# Patient Record
Sex: Female | Born: 1970 | Race: White | Hispanic: No | Marital: Married | Smoking: Former smoker
Health system: Southern US, Community
[De-identification: ages and names within clinical notes are randomized; demographics above are authoritative.]

## PROBLEM LIST (undated history)

## (undated) DIAGNOSIS — Z9221 Personal history of antineoplastic chemotherapy: Secondary | ICD-10-CM

## (undated) DIAGNOSIS — F1010 Alcohol abuse, uncomplicated: Secondary | ICD-10-CM

## (undated) DIAGNOSIS — G4700 Insomnia, unspecified: Secondary | ICD-10-CM

## (undated) DIAGNOSIS — Z923 Personal history of irradiation: Secondary | ICD-10-CM

## (undated) DIAGNOSIS — F1090 Alcohol use, unspecified, uncomplicated: Secondary | ICD-10-CM

## (undated) DIAGNOSIS — C50919 Malignant neoplasm of unspecified site of unspecified female breast: Secondary | ICD-10-CM

## (undated) HISTORY — DX: Alcohol use, unspecified, uncomplicated: F10.90

## (undated) HISTORY — PX: NASAL SEPTUM SURGERY: SHX37

## (undated) HISTORY — DX: Personal history of irradiation: Z92.3

## (undated) HISTORY — DX: Personal history of antineoplastic chemotherapy: Z92.21

## (undated) HISTORY — DX: Insomnia, unspecified: G47.00

---

## 1998-12-26 ENCOUNTER — Other Ambulatory Visit: Admission: RE | Admit: 1998-12-26 | Discharge: 1998-12-26 | Payer: Self-pay | Admitting: Obstetrics & Gynecology

## 1999-07-23 ENCOUNTER — Inpatient Hospital Stay (HOSPITAL_COMMUNITY): Admission: AD | Admit: 1999-07-23 | Discharge: 1999-07-26 | Payer: Self-pay | Admitting: Obstetrics & Gynecology

## 1999-07-28 ENCOUNTER — Encounter: Admission: RE | Admit: 1999-07-28 | Discharge: 1999-10-05 | Payer: Self-pay | Admitting: Obstetrics & Gynecology

## 2002-02-01 ENCOUNTER — Other Ambulatory Visit: Admission: RE | Admit: 2002-02-01 | Discharge: 2002-02-01 | Payer: Self-pay | Admitting: Obstetrics and Gynecology

## 2003-01-24 ENCOUNTER — Ambulatory Visit (HOSPITAL_COMMUNITY): Admission: RE | Admit: 2003-01-24 | Discharge: 2003-01-24 | Payer: Self-pay | Admitting: Obstetrics and Gynecology

## 2003-01-24 ENCOUNTER — Encounter: Payer: Self-pay | Admitting: Obstetrics and Gynecology

## 2003-02-09 ENCOUNTER — Other Ambulatory Visit: Admission: RE | Admit: 2003-02-09 | Discharge: 2003-02-09 | Payer: Self-pay | Admitting: Obstetrics and Gynecology

## 2003-12-02 ENCOUNTER — Inpatient Hospital Stay (HOSPITAL_COMMUNITY): Admission: AD | Admit: 2003-12-02 | Discharge: 2003-12-04 | Payer: Self-pay | Admitting: Obstetrics and Gynecology

## 2004-01-12 ENCOUNTER — Other Ambulatory Visit: Admission: RE | Admit: 2004-01-12 | Discharge: 2004-01-12 | Payer: Self-pay | Admitting: Obstetrics and Gynecology

## 2005-02-20 ENCOUNTER — Other Ambulatory Visit: Admission: RE | Admit: 2005-02-20 | Discharge: 2005-02-20 | Payer: Self-pay | Admitting: Obstetrics and Gynecology

## 2006-02-26 ENCOUNTER — Other Ambulatory Visit: Admission: RE | Admit: 2006-02-26 | Discharge: 2006-02-26 | Payer: Self-pay | Admitting: Obstetrics and Gynecology

## 2008-02-02 ENCOUNTER — Ambulatory Visit (HOSPITAL_COMMUNITY): Admission: RE | Admit: 2008-02-02 | Discharge: 2008-02-02 | Payer: Self-pay | Admitting: Obstetrics and Gynecology

## 2008-06-13 ENCOUNTER — Encounter: Admission: RE | Admit: 2008-06-13 | Discharge: 2008-06-13 | Payer: Self-pay | Admitting: Family Medicine

## 2010-07-31 ENCOUNTER — Other Ambulatory Visit: Payer: Self-pay | Admitting: Obstetrics and Gynecology

## 2010-07-31 DIAGNOSIS — Z1231 Encounter for screening mammogram for malignant neoplasm of breast: Secondary | ICD-10-CM

## 2010-09-28 NOTE — H&P (Signed)
[facility]  Patient:    Kelly Mills, Kelly Mills                      MRN: [id number] Adm. Date:  [id number] Attending:  Cleatrice Burke Dictator:   Wynelle Bourgeois, C.N.M.                         History and Physical  HISTORY OF PRESENT ILLNESS:   Ms. Boutwell is a 40 year old, G2, P0-0-1-0 at 42-1/7 weeks who presents today with spontaneous rupture of membranes at 0700 a.m. on July 22, 1999. Clear fluid noted and contractions now approximately every ten minutes.  The patient denies bleeding and reports positive fetal movement. Pregnancy has been remarkable for first trimester spotting and positive Group B  strep.  PRENATAL LABS:                 Hemoglobin 12.5, platelets 225. Blood type 0 positive. Rh antibody negative.  VDRL RPR nonreactive.  Rubella immune.  HBSAG negative.  HIV nonreactive.  Pap test within normal limits. Positive yeast. Gonorrhea negative.  Chlamydia negative.  MSAFP/free beta within normal limits.  Glucose challenge/Glucola 117.  HISTORY OF PRESENT PREGNANCY:                    Remarkable for first trimester spotting which resolved and positive Group B strep, otherwise unremarkable.  OB HISTORY:                   Remarkable for spontaneous abortion on February 1992 at six to eight weeks gestation with an dilatation and curettage.  The patient ot pregnant on birth control pills and took antibiotics early in that pregnancy.  MEDICAL HISTORY:              Remarkable for the usual childhood illnesses, otherwise unremarkable.  FAMILY HISTORY:               Remarkable for depression in the patients mother.  GENETIC HISTORY:              Remarkable for multiple sclerosis in the maternal  grandfather.  SOCIAL HISTORY:               The patient is married to [employer]. The patient works as Clinical research associate and denies alcohol, tobacco or other substance abuse.  PHYSICAL EXAMINATION:  VITAL SIGNS:                  Afebrile.  Vital  signs stable.  HEENT:                        Within normal limits.  LUNGS:                        Clear to auscultation bilaterally.  HEART:                        Regular rate and rhythm.  No murmurs.  BREASTS:                      Soft. No masses.  ABDOMEN:                      Soft and nontender.   Gravid at 41 cm.  Positive fetal movement.  Fetal heart tones 160s in the office.  EFM  not obtained in office.  PELVIC:                       Cervix 1 to 2 cm, thin, 0 to -1 per speculum exam. Speculum exam positive nitrates, positive ferning, positive pooling.  Clear fluid.  EXTREMITIES:                  Within normal limits.  Negative Homans sign.  ASSESSMENT:                   1. Uterine pregnancy at 42-1/7 weeks.                               2. Spontaneous rupture of membranes times 28 hours.                               3. Early labor.                               4. GBS positive.  PLAN:                         1. Admit to birthing suite per consult Dr. Synetta Fail                                  [place].                               2. Routine CNM orders.                               3. Penicillin G 5 million units IV piggyback                                  times one followed by 3.5 million units IV                                  piggyback q.4h. per GBS protocol. DD:  07/23/99 TD:  07/23/99 Job: [id number]

## 2010-09-28 NOTE — H&P (Signed)
NAME:  Kelly Mills                         ACCOUNT NO.:  [id number]   MEDICAL RECORD NO.:  [id number]                   PATIENT TYPE:  INP   LOCATION:  9167                                 FACILITY:  [facility]   PHYSICIAN:  Crist Fat. Rivard, M.D.              DATE OF BIRTH:  04/01/1971   DATE OF ADMISSION:  12/02/2003  DATE OF DISCHARGE:                                HISTORY & PHYSICAL   HISTORY OF PRESENT ILLNESS:  Kelly Mills is a 40 year old gravida 3 para 1-0-  1-1 at 77 and two-sevenths weeks who presented in active labor with onset of  uterine contractions at approximately 5 p.m.  On admission she was  completely dilated with spontaneous rupture of membranes occurring with  initial exam.  Light meconium-stained fluid was noted.  The patient then  progressed quickly to delivery over a first degree perineal laceration.  She  had a viable female infant by the name of Kelly Mills.  Weight is unknown at this  time.  Apgars were 9 and 9.  The infant was DeLee-suctioned on the perineum  for 3 mL of light meconium-stained fluid.  There was no time for IV access  initiation, beta strep prophylaxis, or admit labs.  The patient completed  her recovery and was taken to postpartum in good condition.  The infant was  taken to the full-term nursery.  Pregnancy was remarkable for:  1. Positive beta strep.  2. Conception on Clomid.  3. Mild anemia.   PRENATAL LABORATORY DATA:  Blood type is O positive, Rh antibody negative.  VDRL nonreactive.  Rubella titer immune.  Hepatitis B surface antigen  negative.  HIV was declined.  Cystic fibrosis testing was declined.  Pap was  normal in July 2004.  Hemoglobin upon entering the practice was 11.2; it was  9.3 at 27 weeks.  EDC of November 30, 2003 was established by last menstrual  period and was in agreement with ultrasound at approximately 9-10 weeks.  Positive beta strep was noted at 36 weeks with GC and chlamydia cultures  negative.  Quadruple screen was  within normal limits.   HISTORY OF PRESENT PREGNANCY:  The patient entered care at approximately 11  weeks.  She did have a little bit of early spotting secondary to cervical  friability.  She had another ultrasound at 19 weeks that showed normal  growth and development.  She had issues with insomnia during her pregnancy.  At 27 weeks she was diagnosed with anemia and was started on iron.  She had  an elevated 1-hour GTT and a normal 3-hour GTT.  The rest of her pregnancy  was essentially uncomplicated.  She had positive beta strep noted at 36  weeks.   OBSTETRICAL HISTORY:  The patient approximately 4 years ago had a vaginal  birth of a female infant without complications.  This pregnancy was achieved  with Clomid.  In 1990 the patient also had  a miscarriage which required a  D&C.   MEDICAL HISTORY:  She has no significant medical problems.  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   FAMILY HISTORY:  Maternal grandmother had an MI and angina.  Father and  mother have hypertension.  Maternal grandmother had varicose veins and a  blood clot.  Mother had a blood clot.  Mother had anemia.  Maternal  grandmother had asthma.  Brother had childhood asthma.  Paternal grandfather  had insulin-dependent diabetes.  Paternal grandmother and paternal uncle had  Alzheimer's.  Mother had a stroke that affected her eye.  Paternal  grandfather had multiple sclerosis.  Maternal grandfather had skin cancer.  Father had a benign brain tumor.  Mother does have a history of depression  and psychotic episodes.  Mother, father, and brother all have moderate use  of alcohol.   SURGICAL HISTORY:  Includes wisdom teeth removed in 1990 and a D&C in 1991.  Her only other hospitalization was for childbirth.   SOCIAL HISTORY:  The patient is married to the father of the baby.  He is  involved and supportive.  His name is [employer].  The patient is self-  employed.  She has been followed by  the certified nurse midwife service at  [facility].  She denies any alcohol, drug, or tobacco use during  this pregnancy.   PHYSICAL EXAMINATION:  VITAL SIGNS:  Stable; the patient afebrile.  HEENT:  Within normal limits.  LUNGS:  Bilateral breath sounds are clear.  HEART:  Regular rate and rhythm without murmur.  BREASTS:  Soft and nontender.  ABDOMEN:  Fundal height is approximately 39 cm.  Estimated fetal weight is 8  to 8-and-a-half pounds.  Uterine contractions are every 3 minutes, strong  quality.  PELVIC:  On cervical exam the patient is completely dilated with bulging bag  of water, fetus is at a 0 station on admission.  Ruptured membranes  spontaneously occurred with the exam with light meconium-stained fluid  noted.  EXTREMITIES:  Deep tendon reflexes are 2+ without clonus.  There is a trace  edema noted.   IMPRESSION:  1. Intrauterine pregnancy at 40 and two-sevenths weeks.  2. Second stage of labor.  3. Positive group B streptococcus with no opportunity anticipated for group     B streptococcus prophylaxis.   PLAN:  1. Admit to birthing suite per consult with Dr. Silverio Lay as attending     physician.  2. Routine certified nurse midwife orders.  3. Will defer IV, antibiotic, and admit labs at present.     Renaldo Reel Emilee Hero, C.N.M.                   Crist Fat Rivard, M.D.    Leeanne Mannan  D:  12/02/2003  T:  12/03/2003  Job:  [id number]

## 2010-10-04 ENCOUNTER — Ambulatory Visit: Payer: Self-pay

## 2010-10-09 ENCOUNTER — Ambulatory Visit
Admission: RE | Admit: 2010-10-09 | Discharge: 2010-10-09 | Disposition: A | Discharge status: Home / Self Care | Payer: BC Managed Care – PPO | Source: Ambulatory Visit | Attending: Obstetrics and Gynecology | Admitting: Obstetrics and Gynecology

## 2010-10-09 DIAGNOSIS — Z1231 Encounter for screening mammogram for malignant neoplasm of breast: Secondary | ICD-10-CM

## 2011-06-26 ENCOUNTER — Other Ambulatory Visit: Payer: Self-pay | Admitting: Obstetrics and Gynecology

## 2011-06-26 DIAGNOSIS — N6310 Unspecified lump in the right breast, unspecified quadrant: Secondary | ICD-10-CM

## 2011-07-03 ENCOUNTER — Ambulatory Visit
Admission: RE | Admit: 2011-07-03 | Discharge: 2011-07-03 | Disposition: A | Discharge status: Home / Self Care | Payer: BC Managed Care – PPO | Source: Ambulatory Visit | Attending: Obstetrics and Gynecology | Admitting: Obstetrics and Gynecology

## 2011-07-03 ENCOUNTER — Other Ambulatory Visit: Payer: Self-pay | Admitting: Obstetrics and Gynecology

## 2011-07-03 DIAGNOSIS — N6310 Unspecified lump in the right breast, unspecified quadrant: Secondary | ICD-10-CM

## 2011-07-08 ENCOUNTER — Ambulatory Visit
Admission: RE | Admit: 2011-07-08 | Discharge: 2011-07-08 | Disposition: A | Discharge status: Home / Self Care | Payer: BC Managed Care – PPO | Source: Ambulatory Visit | Attending: Obstetrics and Gynecology | Admitting: Obstetrics and Gynecology

## 2011-07-08 ENCOUNTER — Other Ambulatory Visit: Payer: Self-pay | Admitting: Obstetrics and Gynecology

## 2011-07-08 ENCOUNTER — Other Ambulatory Visit: Payer: BC Managed Care – PPO

## 2011-07-08 DIAGNOSIS — N6310 Unspecified lump in the right breast, unspecified quadrant: Secondary | ICD-10-CM

## 2011-07-08 DIAGNOSIS — C801 Malignant (primary) neoplasm, unspecified: Secondary | ICD-10-CM | POA: Insufficient documentation

## 2011-07-09 ENCOUNTER — Other Ambulatory Visit: Payer: Self-pay | Admitting: Obstetrics and Gynecology

## 2011-07-09 ENCOUNTER — Ambulatory Visit
Admission: RE | Admit: 2011-07-09 | Discharge: 2011-07-09 | Disposition: A | Discharge status: Home / Self Care | Payer: BC Managed Care – PPO | Source: Ambulatory Visit | Attending: Obstetrics and Gynecology | Admitting: Obstetrics and Gynecology

## 2011-07-09 DIAGNOSIS — N6489 Other specified disorders of breast: Secondary | ICD-10-CM

## 2011-07-09 DIAGNOSIS — C50911 Malignant neoplasm of unspecified site of right female breast: Secondary | ICD-10-CM

## 2011-07-09 DIAGNOSIS — N6310 Unspecified lump in the right breast, unspecified quadrant: Secondary | ICD-10-CM

## 2011-07-11 ENCOUNTER — Telehealth: Payer: Self-pay | Admitting: *Deleted

## 2011-07-11 ENCOUNTER — Other Ambulatory Visit: Payer: Self-pay | Admitting: *Deleted

## 2011-07-11 DIAGNOSIS — C50419 Malignant neoplasm of upper-outer quadrant of unspecified female breast: Secondary | ICD-10-CM | POA: Insufficient documentation

## 2011-07-11 NOTE — Telephone Encounter (Signed)
Confirmed [facility] for 07/17/11 at 1200 .  Instructions and contact information given.  

## 2011-07-13 ENCOUNTER — Ambulatory Visit
Admission: RE | Admit: 2011-07-13 | Discharge: 2011-07-13 | Disposition: A | Discharge status: Home / Self Care | Payer: BC Managed Care – PPO | Source: Ambulatory Visit | Attending: Obstetrics and Gynecology | Admitting: Obstetrics and Gynecology

## 2011-07-13 DIAGNOSIS — C50911 Malignant neoplasm of unspecified site of right female breast: Secondary | ICD-10-CM

## 2011-07-13 MED ORDER — GADOBENATE DIMEGLUMINE 529 MG/ML IV SOLN
15.0000 mL | Freq: Once | INTRAVENOUS | Status: AC | PRN
  Administered 2011-07-13: 15 mL via INTRAVENOUS

## 2011-07-15 ENCOUNTER — Other Ambulatory Visit: Payer: BC Managed Care – PPO

## 2011-07-17 ENCOUNTER — Ambulatory Visit (HOSPITAL_BASED_OUTPATIENT_CLINIC_OR_DEPARTMENT_OTHER): Payer: BC Managed Care – PPO | Admitting: General Surgery

## 2011-07-17 ENCOUNTER — Other Ambulatory Visit (HOSPITAL_BASED_OUTPATIENT_CLINIC_OR_DEPARTMENT_OTHER): Payer: BC Managed Care – PPO | Admitting: Lab

## 2011-07-17 ENCOUNTER — Telehealth: Payer: Self-pay | Admitting: Oncology

## 2011-07-17 ENCOUNTER — Other Ambulatory Visit: Payer: Self-pay | Admitting: Obstetrics and Gynecology

## 2011-07-17 ENCOUNTER — Ambulatory Visit (HOSPITAL_BASED_OUTPATIENT_CLINIC_OR_DEPARTMENT_OTHER): Payer: BC Managed Care – PPO | Admitting: Oncology

## 2011-07-17 ENCOUNTER — Ambulatory Visit: Payer: BC Managed Care – PPO | Attending: General Surgery | Admitting: Physical Therapy

## 2011-07-17 ENCOUNTER — Ambulatory Visit: Payer: BC Managed Care – PPO

## 2011-07-17 ENCOUNTER — Encounter: Payer: Self-pay | Admitting: Oncology

## 2011-07-17 ENCOUNTER — Encounter (INDEPENDENT_AMBULATORY_CARE_PROVIDER_SITE_OTHER): Payer: Self-pay | Admitting: General Surgery

## 2011-07-17 ENCOUNTER — Ambulatory Visit
Admission: RE | Admit: 2011-07-17 | Discharge: 2011-07-17 | Disposition: A | Discharge status: Home / Self Care | Payer: BC Managed Care – PPO | Source: Ambulatory Visit | Attending: Radiation Oncology | Admitting: Radiation Oncology

## 2011-07-17 VITALS — BP 124/85 | HR 64 | Temp 98.3°F | Ht 65.0 in | Wt 154.2 lb

## 2011-07-17 DIAGNOSIS — R928 Other abnormal and inconclusive findings on diagnostic imaging of breast: Secondary | ICD-10-CM

## 2011-07-17 DIAGNOSIS — C50419 Malignant neoplasm of upper-outer quadrant of unspecified female breast: Secondary | ICD-10-CM

## 2011-07-17 DIAGNOSIS — IMO0001 Reserved for inherently not codable concepts without codable children: Secondary | ICD-10-CM | POA: Insufficient documentation

## 2011-07-17 DIAGNOSIS — M25619 Stiffness of unspecified shoulder, not elsewhere classified: Secondary | ICD-10-CM | POA: Insufficient documentation

## 2011-07-17 DIAGNOSIS — C50919 Malignant neoplasm of unspecified site of unspecified female breast: Secondary | ICD-10-CM | POA: Insufficient documentation

## 2011-07-17 LAB — CBC WITH DIFFERENTIAL/PLATELET
BASO%: 0.4 % (ref 0.0–2.0)
EOS%: 3.2 % (ref 0.0–7.0)
HCT: 36.8 % (ref 34.8–46.6)
MCH: 33.2 pg (ref 25.1–34.0)
MCHC: 34.1 g/dL (ref 31.5–36.0)
MONO#: 0.6 10*3/uL (ref 0.1–0.9)
NEUT%: 60.2 % (ref 38.4–76.8)
RBC: 3.78 10*6/uL (ref 3.70–5.45)
RDW: 11.8 % (ref 11.2–14.5)
WBC: 7.7 10*3/uL (ref 3.9–10.3)
lymph#: 2.2 10*3/uL (ref 0.9–3.3)

## 2011-07-17 LAB — COMPREHENSIVE METABOLIC PANEL
ALT: 16 U/L (ref 0–35)
AST: 15 U/L (ref 0–37)
Albumin: 3.8 g/dL (ref 3.5–5.2)
CO2: 29 mEq/L (ref 19–32)
Calcium: 9.4 mg/dL (ref 8.4–10.5)
Chloride: 101 mEq/L (ref 96–112)
Potassium: 3.9 mEq/L (ref 3.5–5.3)
Sodium: 138 mEq/L (ref 135–145)
Total Protein: 6.9 g/dL (ref 6.0–8.3)

## 2011-07-17 LAB — CANCER ANTIGEN 27.29: CA 27.29: 19 U/mL (ref 0–39)

## 2011-07-17 NOTE — Progress Notes (Signed)
Referral MD Dr Juanita Craver; Dr Dois Davenport Rivard  Reason for Referral: Pleasant 41 year old woman referred for evaluation of breast mass.    Chief Complaint  Patient presents with  . Breast Cancer  : The patient has been in good health. She has had previous mammograms. Last mammogram was in May 2012. She self detected a mass in her left breast in January of this year. She sought medical attention for this and was referred for mammogram and ultrasound. This took place on 07/03/2011. At that time a firm palpable mass was noted at 10:00 position. This measured 1.8 x 1.7 x 1.6 cm. Prominent lymph node was seen in the axilla. Biopsies of both areas were was performed. Both the breast and lymph node showed invasive mammary carcinoma he ER status was 98% PR +80%, proliferative index 84% HER-2 was negative with a ratio 0.96 MRI scan of both breasts performed 07/13/2011 showed a mass measuring 3.3 x 2.3 x 2 cm. In addition there was an enhancing nodule located 8 mm posterior inferior to the larger mass measuring 1.1 x 0.8 x 0.9 cm. In aggregate this total area measured 4.2 cm.   HPI:  History reviewed. No pertinent past medical history.:  Past Surgical History  Procedure Date  . Nasal septum surgery   :  Current outpatient prescriptions:Multiple Vitamin (MULTIVITAMIN) capsule, Take 1 capsule by mouth daily., Disp: , Rfl: ;  TRAZODONE HCL PO, Take by mouth as needed., Disp: , Rfl: ;  Zolpidem Tartrate (AMBIEN PO), Take by mouth at bedtime as needed., Disp: , Rfl: :    :  Allergies  Allergen Reactions  . Codeine Nausea Only  :  Family History  Problem Relation Age of Onset  . Cancer Maternal Grandfather  melanoma   :  History   Social History  . Marital Status: Married x 16y    Spouse Name: Benson Norway - IT consultant    Number of Children: N/A  . Years of Education: N/A, graduated from [employer]- MFA creative writing   Occupational History  . 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 Smoking status: Never Smoker   . Smokeless tobacco: Not on file  . Alcohol Use: Yes  . Drug Use:   . Sexually Active:    Other Topics Concern  . Not on file   Social History Narrative  .  2 children  12 and 7  :Reproductive History Hx of frequent clomid injections and other fertility drugs over 2 yrs G5P2   A comprehensive review of systems was negative.  Exam:  @IPVITALS @ General appearance: alert and cooperative Eyes: conjunctivae/corneas clear. PERRL, EOM's intact. Fundi benign. Throat: lips, mucosa, and tongue normal; teeth and gums normal Resp: clear to auscultation bilaterally and normal percussion bilaterally Breasts: normal appearance, 3-5 cm breast mass lateral aspect of rt breast. Cardio: regular rate and rhythm, S1, S2 normal, no murmur, click, rub or gallop and normal apical impulse GI: soft, non-tender; bowel sounds normal; no masses,  no organomegaly Extremities: extremities normal, atraumatic, no cyanosis or edema Pulses: 2+ and symmetric Lymph nodes: Cervical, supraclavicular, and axillary nodes normal. Neurologic: Alert and oriented X 3, normal strength and tone. Normal symmetric reflexes. Normal coordination and gait   Basename 07/17/11 1244  WBC 7.7  HGB 12.5  HCT 36.8  PLT 230    Basename 07/17/11 1244  NA 138  K 3.9  CL 101  CO2 29  GLUCOSE 101*  BUN  13  CREATININE 0.65  CALCIUM 9.4    Blood smear review: n/a  Pathology:as above  US Breast Right  07/03/2011  *RADIOLOGY REPORT*  Clinical Data:  Questioned palpable finding per patient right upper outer quadrant  DIGITAL DIAGNOSTIC RIGHT MAMMOGRAM WITH CAD AND RIGHT BREAST ULTRASOUND:  Comparison:  Prior exams, bilateral screening mammography 10/09/2010  Findings:  There is an ill-defined irregular mass-like density in the right upper outer quadrant at the site of the questioned palpable finding.  The right breast is heterogeneously dense. Compared  with prior exam, this finding is new.  No suspicious calcification is identified. Mammographic images were processed with CAD.  On physical exam, I palpate a firm mass in the right breast 10 o'clock location.  Ultrasound is performed, showing an irregular hypoechoic shadowing mass in the right breast 10 o'clock location 5 cm from the nipple measuring 1.8 x 1.7 x 1.6 cm. In the right axilla, there is a lymph node with nodular prominent cortex measuring 0.6 cm in maximal short axis diameter.  IMPRESSION: Suspicious right breast mass 10 o'clock location and abnormal right axillary lymph node.  Ultrasound-guided core biopsy is recommended of both areas and will be scheduled at the patient's convenience. Findings and recommendations discussed with the patient and provided in written form at the time of the exam.  I also discussed these findings with Vernona Rieger in the office of Dr. Estanislado Pandy at the time of imaging.  BI-RADS CATEGORY 4:  Suspicious abnormality - biopsy should be considered.  Recommendation:  Right breast ultrasound guided core biopsy and right axillary lymph node biopsy  Original Report Authenticated By: Harrel Lemon, M.D.   Mr Breast Bilateral W Wo Contrast  07/15/2011  *RADIOLOGY REPORT*  Clinical Data: Recently diagnosed right breast invasive mammary carcinoma with metastases within a right axillary lymph nodes. Preoperative evaluation.  BILATERAL BREAST MRI WITH AND WITHOUT CONTRAST  Technique: Multiplanar, multisequence MR images of both breasts were obtained prior to and following the intravenous administration of 15ml of Multihance.  Three dimensional images were evaluated at the independent DynaCad workstation.  Comparison:  07/08/2011, 07/03/2011, 10/09/2010.  Findings: There is an irregular enhancing mass located laterally within the posterior third of the right breast at the 9 o'clock position with central clip artifact consistent with the recently diagnosed invasive mammary carcinoma.  This mass  measures 3.3 x 2.3 x 2.0 cm in size.  There is an enhancing  1.1 x 0.8 x 0.9 cm satellite nodule located 8 mm posterior and inferior to the larger mass.  In aggregate the larger mass and the satellite nodule measure 4.2 cm in greatest dimension.  Both of these masses are associated with washout enhancement kinetics.  There are no additional or worrisome enhancing lesions within the right breast or within the left breast.  There are two adjacent prominent level I right axillary lymph nodes with loss of the normal fatty hilum worrisome for metastatic lymph nodes (the patient has undergone recent ultrasound guided core biopsy ).  The larger of the two lymph nodes measures 1.6 cm in greatest dimension.  There are no abnormal left axillary lymph nodes or internal mammary lymph nodes.  There are no additional findings.  IMPRESSION:  1.  Irregular enhancing mass with washout kinetics measuring maximally 3.3 cm in size located within the right breast at the 9 o'clock position corresponding to the recently biopsied invasive mammary carcinoma.  There is a 1.1 cm satellite nodule located 8 mm posterior and inferior to this larger mass  which is also worrisome for invasive mammary carcinoma.  In aggregate, the larger mass and the satellite mass measure  4.2 cm in greatest dimension. 2.  Two adjacent abnormal appearing level I right axillary lymph nodes worrisome for metastatic lymph nodes as discussed above.  No additional findings.  THREE-DIMENSIONAL MR IMAGE RENDERING ON INDEPENDENT WORKSTATION:  Three-dimensional MR images were rendered by post-processing of the original MR data on an independent workstation.  The three- dimensional MR images were interpreted, and findings were reported in the accompanying complete MRI report for this study.  BI-RADS CATEGORY 6:  Known biopsy-proven malignancy - appropriate action should be taken.  Original Report Authenticated By: Rolla Plate, M.D.   [place] Core Biopsy  07/09/2011   *RADIOLOGY REPORT*  Clinical Data:  Palpable mass at 9 o'clock 7 cm from the right nipple with suspicious features at mammography and sonography.  ULTRASOUND GUIDED VACUUM ASSISTED CORE BIOPSY OF THE RIGHT BREAST  The patient and I discussed the procedure of ultrasound-guided biopsy, including benefits and alternatives.  We discussed the high likelihood of a successful procedure. We discussed the risks of the procedure, including infection, bleeding, tissue injury, clip migration and inadequate sampling.  Informed written consent was given.  Using sterile technique, 2% lidocaine, ultrasound guidance and a 12 gauge vacuum assisted needle, biopsy was performed of the mass at 9 o'clock 7 cm from the right nipple.  At the conclusion of the procedure, a ribbon tissue marker clip was deployed into the biopsy cavity.  Follow-up 2-view mammogram was performed and dictated separately.  IMPRESSION: Ultrasound-guided biopsy of a mass at 9 o'clock 7 cm from the right nipple.  No apparent complications.  Original Report Authenticated By: Daryl Eastern, M.D.   [place] Core Biopsy  07/09/2011  *RADIOLOGY REPORT*  Clinical Data:  The patient feels a mass in the 9 o'clock position of the right breast.  An abnormal right axillary lymph node with a thickened cortex was seen at sonography.  This biopsy is of the right axillary lymph node.  ULTRASOUND GUIDED CORE BIOPSY OF THE RIGHT AXILLA  The patient and I discussed the procedure of ultrasound-guided biopsy, including benefits and alternatives.  We discussed the high likelihood of a successful procedure. We discussed the risks of the procedure, including infection, bleeding, tissue injury, clip migration and inadequate sampling.  Informed written consent was given.  Using sterile technique, 2% lidocaine, ultrasound guidance and a 14 gauge automated biopsy device, biopsy was performed of the right axillary lymph node.  IMPRESSION: Ultrasound guided biopsy of an abnormal right  axillary lymph node. No apparent complications.  Original Report Authenticated By: Daryl Eastern, M.D.   Mm Digital Diagnostic Unilat L  07/09/2011  *RADIOLOGY REPORT*  Clinical Data:  Pre MRI mammogram of the left breast.  The patient was recently diagnosed with the right breast cancer.  DIGITAL DIAGNOSTIC LEFT MAMMOGRAM WITH CAD  Comparison:  10/09/2010  Findings:  There are scattered fibroglandular densities.  There is no dominant mass, architectural distortion or calcification to suggest malignancy. Mammographic images were processed with CAD.  IMPRESSION: No mammographic evidence of malignancy in the left breast.  Yearly mammography is suggested.  BI-RADS CATEGORY 1:  Negative.  Original Report Authenticated By: Daryl Eastern, M.D.   Mm Digital Diagnostic Unilat R  07/09/2011  *RADIOLOGY REPORT*  Clinical Data:  Ultrasound-guided core needle biopsy of a mass at 9 o'clock in the right breast with clip placement.  DIGITAL DIAGNOSTIC RIGHT MAMMOGRAM  Comparison:  None.  Findings:  Films are performed following ultrasound guided biopsy of a mass at 9 o'clock on the right.  The InRad ribbon clip is appropriately positioned.  IMPRESSION: Appropriate clip placement following ultrasound-guided core needle biopsy of a mass at 9 o'clock in the right.  Original Report Authenticated By: Daryl Eastern, M.D.   Mm Digital Diagnostic Unilat R  07/03/2011  *RADIOLOGY REPORT*  Clinical Data:  Questioned palpable finding per patient right upper outer quadrant  DIGITAL DIAGNOSTIC RIGHT MAMMOGRAM WITH CAD AND RIGHT BREAST ULTRASOUND:  Comparison:  Prior exams, bilateral screening mammography 10/09/2010  Findings:  There is an ill-defined irregular mass-like density in the right upper outer quadrant at the site of the questioned palpable finding.  The right breast is heterogeneously dense. Compared with prior exam, this finding is new.  No suspicious calcification is identified. Mammographic images were  processed with CAD.  On physical exam, I palpate a firm mass in the right breast 10 o'clock location.  Ultrasound is performed, showing an irregular hypoechoic shadowing mass in the right breast 10 o'clock location 5 cm from the nipple measuring 1.8 x 1.7 x 1.6 cm. In the right axilla, there is a lymph node with nodular prominent cortex measuring 0.6 cm in maximal short axis diameter.  IMPRESSION: Suspicious right breast mass 10 o'clock location and abnormal right axillary lymph node.  Ultrasound-guided core biopsy is recommended of both areas and will be scheduled at the patient's convenience. Findings and recommendations discussed with the patient and provided in written form at the time of the exam.  I also discussed these findings with Vernona Rieger in the office of Dr. Estanislado Pandy at the time of imaging.  BI-RADS CATEGORY 4:  Suspicious abnormality - biopsy should be considered.  Recommendation:  Right breast ultrasound guided core biopsy and right axillary lymph node biopsy  Original Report Authenticated By: Harrel Lemon, M.D.   Mm Radiologist Eval And Mgmt  07/09/2011  *RADIOLOGY REPORT*  ESTABLISHED PATIENT OFFICE VISIT - LEVEL II [phone])  Chief Complaint:  The patient returns for discussion of the pathology report after ultrasound-guided core needle biopsy of a mass in the 9 o'clock position of the right breast and of an abnormal right axillary lymph node.  History:  The patient felt a mass at 9 o'clock in the right breast. Imaging evaluation demonstrated a mass in this location as well as an abnormal right axillary lymph node.  Ultrasound-guided core needle biopsy of each laser was performed on 07/08/2011.  Exam:  On physical examination, there is moderate ecchymosis at the biopsy site at 9 o'clock.  There is mild ecchymosis at the axillary lymph node biopsy site.  There is no sign of hematoma or infection.  Assessment and Plan:  Histologic evaluation demonstrates invasive mammary carcinoma and in situ  carcinoma involving the mass at 9 o'clock.  The right axillary lymph node is positive for metastatic mammary carcinoma.  This is concordant with the imaging findings. Results were discussed with the patient and her husband.  Breast MRI was scheduled for 07/15/2011.  The patient was scheduled to be seen in the [facility] on 07/17/2011.  The patient's questions were answered.  Educational materials were given.  Original Report Authenticated By: Daryl Eastern, M.D.    Assessment and Plan:  Stage IIB (T2N1), ER PR positive breast cancer, HER-2 negative. We spent 40 minutes today discussing upfront chemotherapy prior to definitive surgery. This would then be followed by radiation and likely hormonal therapy. I described  a regimen of FEC  given every 2 weeks for 4 cycles followed by Taxotere given in the same schedule. We will do genetic screening, PET scan, port placement and 2-D echo followed by chemotherapy teaching. I will see the patient back in 2-3 weeks prior to starting definitive chemotherapy.   Pierce Crane M.D. FRCP C.  45 minutes was spent with this patient half the time and patient-related counseling

## 2011-07-17 NOTE — Progress Notes (Signed)
Encounter addended by: [name], MD on: 07/17/2011  8:06 PM<BR>     Documentation filed: Notes Section

## 2011-07-17 NOTE — Progress Notes (Signed)
Patient ID: Kelly Mills, female   DOB: 12/13/1970, 41 y.o.   MRN: [id number]  Chief Complaint  Patient presents with  . Other    breast cancer    HPI Kelly Mills is a 40 y.o. female.  Referred by Dr. Randy Jackson HPI This is a healthy 41 yof who presents since January with a right breast mass.  This is not associated with tenderness, nipple discharge and has not changed since she noticed it.  She underwent diagnostic mmg showing an ill defined irregular right breast mass.  Ultrasound shows a 1.8x1.7x1.6 cm mass.  In the right axilla she was also noted to have an abnormal lymph node.  U/S guided core biopsy of breast and axillary lesions was performed.   A left sided mmg was also performed and this is negative.  Breast MRI showed an irregular enhancing mass measuring 3.3 cm in size in the right breast with a 1.1 cm satellite nodule located 8mm away.  This entire area measures 4.2 cm in size.  There are also two abnormal nodes in the axilla.  Pathology of the node shows metastatic mammary carcinoma.  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  This is  her2neu not amplified, er pos at 98, pr pos at8 and Ki67 is 84%.  She comes in today to discuss options at [facility].  History reviewed. No pertinent past medical history.  Past Surgical History  Procedure Date  . Nasal septum surgery     Family History  Problem Relation Age of Onset  . Cancer Maternal Grandfather     Social History History  Substance Use Topics  . Smoking status: Never Smoker   . Smokeless tobacco: Not on file  . Alcohol Use: Yes    Allergies  Allergen Reactions  . Codeine Nausea Only    Current Outpatient Prescriptions  Medication Sig Dispense Refill  . Multiple Vitamin (MULTIVITAMIN) capsule Take 1 capsule by mouth daily.      . TRAZODONE HCL PO Take by mouth as needed.      . Zolpidem Tartrate (AMBIEN PO) Take by mouth at bedtime as needed.        Review of Systems Review  of Systems  Constitutional: Negative for fever, chills and unexpected weight change.  HENT: Negative for hearing loss, congestion, sore throat, trouble swallowing and voice change.   Eyes: Negative for visual disturbance.  Respiratory: Negative for cough and wheezing.   Cardiovascular: Negative for chest pain, palpitations and leg swelling.  Gastrointestinal: Positive for abdominal pain. Negative for nausea, vomiting, diarrhea, constipation, blood in stool, abdominal distention and anal bleeding.  Genitourinary: Negative for hematuria, vaginal bleeding and difficulty urinating.  Musculoskeletal: Negative for arthralgias.  Skin: Negative for rash and wound.  Neurological: Negative for seizures, syncope and headaches.  Hematological: Negative for adenopathy. Does not bruise/bleed easily.  Psychiatric/Behavioral: Negative for confusion.    Last menstrual period 06/13/2011.  Physical Exam Physical Exam  Vitals reviewed. Constitutional: She appears well-developed and well-nourished.  Neck: Neck supple.  Cardiovascular: Normal rate, regular rhythm and normal heart sounds.   Pulmonary/Chest: Effort normal and breath sounds normal. She has no wheezes. She has no rales. Right breast exhibits mass. Right breast exhibits no inverted nipple, no nipple discharge, no skin change and no tenderness. Left breast exhibits no inverted nipple, no mass, no nipple discharge, no skin change and no tenderness. Breasts are symmetrical.    Abdominal: Soft. Normal appearance. There is no hepatomegaly.    Lymphadenopathy:      She has no cervical adenopathy.    She has axillary adenopathy.       Right axillary: Lateral adenopathy present. No pectoral adenopathy present.       Left axillary: No pectoral and no lateral adenopathy present.      Right: No supraclavicular adenopathy present.       Left: No supraclavicular adenopathy present.    Data Reviewed BILATERAL BREAST MRI WITH AND WITHOUT CONTRAST    Technique: Multiplanar, multisequence MR images of both breasts  were obtained prior to and following the intravenous administration  of 15ml of Multihance. Three dimensional images were evaluated at  the independent DynaCad workstation.  Comparison: 07/08/2011, 07/03/2011, 10/09/2010.  Findings: There is an irregular enhancing mass located laterally  within the posterior third of the right breast at the 9 o'clock  position with central clip artifact consistent with the recently  diagnosed invasive mammary carcinoma. This mass measures 3.3 x 2.3  x 2.0 cm in size. There is an enhancing 1.1 x 0.8 x 0.9 cm  satellite nodule located 8 mm posterior and inferior to the larger  mass. In aggregate the larger mass and the satellite nodule  measure 4.2 cm in greatest dimension. Both of these masses are  associated with washout enhancement kinetics. There are no  additional or worrisome enhancing lesions within the right breast  or within the left breast.  There are two adjacent prominent level I right axillary lymph nodes  with loss of the normal fatty hilum worrisome for metastatic lymph  nodes (the patient has undergone recent ultrasound guided core  biopsy ). The larger of the two lymph nodes measures 1.6 cm in  greatest dimension. There are no abnormal left axillary lymph  nodes or internal mammary lymph nodes. There are no additional  findings.  IMPRESSION:  1. Irregular enhancing mass with washout kinetics measuring  maximally 3.3 cm in size located within the right breast at the 9  o'clock position corresponding to the recently biopsied invasive  mammary carcinoma. There is a 1.1 cm satellite nodule located 8 mm  posterior and inferior to this larger mass which is also worrisome  for invasive mammary carcinoma. In aggregate, the larger mass and  the satellite mass measure 4.2 cm in greatest dimension.  2. Two adjacent abnormal appearing level I right axillary lymph  nodes worrisome  for metastatic lymph nodes as discussed above. No  additional findings.    Assessment   Locally advanced right breast cancer    Plan    Port placement for primary systemic chemotherapy  1.  Genetic testing 2.  Port placement was discussed to begin chemotherapy and will schedule asap.  I showed her the port and where it would be placed and how it would be used.  Risks of insertion including bleeding, infection, and pneumothorax were discussed.   3.  We discussed surgery for breast cancer at length today.  We discussed at time of surgery she will need an axillary node dissection due to her node positivity right now.  I discussed that right now she would require a mastectomy.  She is interested in breast conservation therapy.  I will ask radiology to place a clip in the satellite nodule before proceeding but I think primary chemo is best for her disease as well as to possibly decrease size to make her a candidate for BCT.  We discussed about a 2/3s chance of being able to do this.  I will plan on seeing her at end of   chemo with repeat MRI and then she and I can decide how to proceed with surgery. 4. Metastatic workup with med onc       Jaydy Fitzhenry 07/17/2011, 4:57 PM    

## 2011-07-17 NOTE — Progress Notes (Addendum)
[facility] Radiation Oncology NEW PATIENT EVALUATION  Name: Kelly Mills MRN: [id number]  Date: 07/17/2011  DOB: 05/07/1971  Status: outpatient   CC:   Kelly Loron, MD , Dr. Pierce Mills, Dr. Dois Davenport Mills, Dr. 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 female who is seen today at the [facility] for evaluation of her stage IIB (T2, N1, M0) invasive ductal carcinoma of the right breast. She noted a mass along the lateral aspect of the right breast approximately 2 months ago. She was seen by Dr. Dois Davenport Mills and had mammography on 07/03/2011. There was a palpable mass at 10:00. The mass was seen along the upper-outer quadrant of the right breast which on ultrasound measured 1.8 x 1.7 x 1.6 cm, 5 cm from the nipple. Ultrasound-guided core biopsy on July 08, 2011 was diagnostic for invasive ductal carcinoma which was ER positive at 90%, PR positive at 80% all with a proliferation marker/Ki-67 of 84%. A biopsy of a suspicious axillary lymph node within the right axilla was also diagnostic for metastatic carcinoma. Breast MRI on 07/13/2011 showed a 3.3 x 2.3 x 2.0 cm mass at 9:00 in addition to an enhancing 1.1 x 0.8 x 0.9 cm nodule 8 millimeters posterior and inferior to the larger mass. In aggregate both lesions extended over 4.2 cm. 2 adjacent level I lymph nodes were suspicious for metastatic carcinoma. She is seen with Dr. Jamey Mills and Dr. Welton Mills at the [facility].   PREVIOUS RADIATION THERAPY: No   PAST MEDICAL HISTORY:  has no past medical history on file.     PAST SURGICAL HISTORY:  Past Surgical History  Procedure Date  . Nasal septum surgery      FAMILY HISTORY: family history includes Cancer in her maternal grandfather. Her father is alive at 38, in her mother is alive at 38.   SOCIAL HISTORY:  does not  have a smoking history on file. She does not have any smokeless tobacco history on file. Married with 2 children, ages 25 and 20. 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   ALLERGIES: Codeine   MEDICATIONS:  Current Outpatient Prescriptions  Medication Sig Dispense Refill  . Multiple Vitamin (MULTIVITAMIN) capsule Take 1 capsule by mouth daily.      . TRAZODONE HCL PO Take by mouth as needed.      . Zolpidem Tartrate (AMBIEN PO) Take by mouth at bedtime as needed.          REVIEW OF SYSTEMS:  Pertinent items are noted in HPI.    PHYSICAL EXAM: Alert and oriented 41 year old white female appearing her stated age. Vital signs: Blood pressure 124/85, pulse 64, respiratory rate 20 temperature 98.3  Head and neck examination: Grossly unremarkable. Nodes: Without palpable cervical, supraclavicular, or axillary lymphadenopathy with the exception of a mobile 1.5 cm node within the right axilla. Chest: Lungs clear. Back: Without spinal or CVA tenderness. Heart: Regular in rhythm.: There is a 3-3.5 cm mass along the lateral aspect the right breast at 9- 10:00. There is overlying ecchymosis. No other masses are appreciated. Left breast without masses or lesions. Abdomen: Without masses organomegaly. Extremities: Without edema. Neurologic examination: Grossly nonfocal.    LABORATORY DATA:  Lab Results  Component Value Date   WBC 7.7 07/17/2011   HGB 12.5 07/17/2011  HCT 36.8 07/17/2011   MCV 97.3 07/17/2011   PLT 230 07/17/2011   Lab Results  Component Value Date   NA 138 07/17/2011   K 3.9 07/17/2011   CL 101 07/17/2011   CO2 29 07/17/2011   Lab Results  Component Value Date   ALT 16 07/17/2011   AST 15 07/17/2011   ALKPHOS 44 07/17/2011   BILITOT 0.2* 07/17/2011      IMPRESSION: Clinical stage IIB (T2 N1 MX) invasive ductal carcinoma of the right breast. She will have a PET scan to complete her staging workup. Understand that she is scheduled for a biopsy of the satellite mass  and placement of a clip in anticipation of neoadjuvant chemotherapy. This will assist [name] at the time of a partial mastectomy to make sure that both lesions are completely excised. We discussed local treatment options which include mastectomy versus partial mastectomy followed by radiation therapy. Because of her axillary nodal disease she will require an axillary dissection at the time of her partial mastectomy. I discussed the role of radiation therapy and the potential acute and late toxicities of radiation therapy depending on the extent of her breast/nodal irradiation.   PLAN: As discussed above. She'll also undergo genetic testing in view of her age. She will move on to neoadjuvant chemotherapy under the direction of [name] after completion of her staging workup. I can see her again after her definitive surgery with [name].  I spent 40 minutes minutes face to face with the patient and more than 50% of that time was spent in counseling and/or coordination of care.

## 2011-07-17 NOTE — Telephone Encounter (Signed)
S/w the pt and she is aware of her chemo educ class appt on 07/18/2011 and also to pick up her next f/u appt with dr Donnie Coffin along with the pet scan appt. Sent a istaff message to [employer] dr Kerry Dory nurse regarding this pt needing aport placement for chemo to be placed by dr wakefield. Pt is aware she will be contacted directly with that appt

## 2011-07-18 ENCOUNTER — Ambulatory Visit (HOSPITAL_BASED_OUTPATIENT_CLINIC_OR_DEPARTMENT_OTHER): Payer: BC Managed Care – PPO | Admitting: Genetic Counselor

## 2011-07-18 ENCOUNTER — Encounter: Payer: Self-pay | Admitting: *Deleted

## 2011-07-18 ENCOUNTER — Other Ambulatory Visit: Payer: BC Managed Care – PPO

## 2011-07-18 ENCOUNTER — Ambulatory Visit: Payer: BC Managed Care – PPO

## 2011-07-18 DIAGNOSIS — C50419 Malignant neoplasm of upper-outer quadrant of unspecified female breast: Secondary | ICD-10-CM

## 2011-07-18 NOTE — Progress Notes (Signed)
Dr.  Donnie Coffin requested a consultation for genetic counseling and risk assessment for Kelly Mills, a 41 y.o. female, for discussion of her personal history of breast cancer. She presents to clinic today to discuss the possibility of a genetic predisposition to cancer, and to further clarify her risks, as well as her family members' risks for cancer.   HISTORY OF PRESENT ILLNESS: In 2013, at the age of 21, Kelly Mills was diagnosed with invasive mammary carcinoma of the right breast. This will be treated with chemotherapy and surgery.    No past medical history on file.  Past Surgical History  Procedure Date  . Nasal septum surgery     History  Substance Use Topics  . Smoking status: Never Smoker   . Smokeless tobacco: Not on file  . Alcohol Use: Yes    REPRODUCTIVE HISTORY AND PERSONAL RISK ASSESSMENT FACTORS: Menarche was at age 74.   Pre-menopausal Uterus Intact: yes Ovaries Intact: yes G5P2A3 , first live birth at age 2  She has previously undergone treatment for infertility.   OCP use: between 7-10 years   She has not used HRT in the past.    FAMILY HISTORY:  We obtained a detailed, 4-generation family history.  Significant diagnoses are listed below: Family History  Problem Relation Age of Onset  . Cancer Maternal Grandfather   Ms. Jezek was diagnosed with invasive mammary carcinoma of the right breast.  There is a history of skin cancer, possibly melanoma, in her maternal grandfather who is now 51.  No other cancer history was reported on her maternal or paternal side of the family.  Patient's maternal ancestors are of [place] and [place] descent, and paternal ancestors are of [place] and [place] descent. There is no reported Ashkenazi Jewish ancestry. There is no  known consanguinity.  GENETIC COUNSELING RISK ASSESSMENT, DISCUSSION, AND SUGGESTED FOLLOW UP: We reviewed the natural history and genetic etiology of sporadic, familial and hereditary cancer  syndromes.About 5-10% of breast cancer is hereditary.  BRCA1 and BRCA2 mutations make up approximately 85% of this risk.  We discussed the dominant inheritance pattern of this condition.  We also discussed the early onset Alzheimer's disease found on the paternal side of the family.  We discussed that there is not good genetic testing for Alzheimer's disease.  Knowing that her father is older than the age that her grandmother and uncle showed symptoms, he has started to outlive his risk.  This is not to say that he will not get dementia.  Patient seems to understand this information.  The patient's personal history of breast cancer at age 32 is suggestive of the following possible diagnosis: BRCA1 or BRCA2 mutation causing hereditary breast and ovarian cancer  We discussed that identification of a hereditary cancer syndrome may help her care providers tailor the patients medical management. If a mutation indicating hereditary breast and ovarian cancer is detected in this case, the [employer] recommendations would include increased breast cancer surveillance and possible mastectomy and oopherectomy. If a mutation is detected, the patient will be referred back to the referring provider and to any additional appropriate care providers to discuss the relevant options.   If a mutation is not found in the patient, this will decrease the likelihood of hereditary breast and ovarian cancer as the explanation for her breast cancer. Cancer surveillance options would be discussed for the patient according to the appropriate standard National Comprehensive Cancer Network and American Cancer Society guidelines, with consideration of their  personal and family history risk factors. In this case, the patient will be referred back to their care providers for discussions of management.   After considering the risks, benefits, and limitations, the patient provided informed consent for  the following   testing:  BRACAnalysis through [employer].   Per the patient's request, we will contact her by telephone to discuss these results. A follow up genetic counseling visit will be scheduled if indicated.  The patient was seen for a total of 60 minutes, greater than 50% of which was spent face-to-face counseling.  This plan is being carried out per Dr. Renelda Loma recommendations.  This note will also be sent to the referring provider via the electronic medical record. The patient will be supplied with a summary of this genetic counseling discussion as well as educational information on the discussed hereditary cancer syndromes following the conclusion of their visit.   Patient was discussed with Dr. Drue Second.   EPIC CC: Rubin   EDUCATIONAL INFORMATION SUPPLIED TO PATIENT AT ENCOUNTER:  Hereditary breast and ovarian cancer brochure by [employer] [employer]   _______________________________________________________________________ For Office Staff:  Number of people involved in session: 2 Was an Intern/ student involved with case: not applicable }

## 2011-07-19 ENCOUNTER — Other Ambulatory Visit: Payer: Self-pay | Admitting: Obstetrics and Gynecology

## 2011-07-19 ENCOUNTER — Ambulatory Visit
Admission: RE | Admit: 2011-07-19 | Discharge: 2011-07-19 | Disposition: A | Discharge status: Home / Self Care | Payer: BC Managed Care – PPO | Source: Ambulatory Visit | Attending: Obstetrics and Gynecology | Admitting: Obstetrics and Gynecology

## 2011-07-19 ENCOUNTER — Other Ambulatory Visit (HOSPITAL_COMMUNITY): Payer: Self-pay | Admitting: *Deleted

## 2011-07-19 ENCOUNTER — Encounter (HOSPITAL_BASED_OUTPATIENT_CLINIC_OR_DEPARTMENT_OTHER): Payer: Self-pay | Admitting: *Deleted

## 2011-07-19 DIAGNOSIS — R928 Other abnormal and inconclusive findings on diagnostic imaging of breast: Secondary | ICD-10-CM

## 2011-07-21 MED ORDER — CEFAZOLIN SODIUM 1-5 GM-% IV SOLN
1.0000 g | INTRAVENOUS | Status: AC
  Administered 2011-07-22: 1 g via INTRAVENOUS

## 2011-07-22 ENCOUNTER — Telehealth (INDEPENDENT_AMBULATORY_CARE_PROVIDER_SITE_OTHER): Payer: Self-pay

## 2011-07-22 ENCOUNTER — Inpatient Hospital Stay (HOSPITAL_COMMUNITY): Payer: BC Managed Care – PPO

## 2011-07-22 ENCOUNTER — Inpatient Hospital Stay (HOSPITAL_COMMUNITY): Payer: BC Managed Care – PPO | Admitting: Anesthesiology

## 2011-07-22 ENCOUNTER — Encounter (HOSPITAL_COMMUNITY): Payer: Self-pay | Admitting: Anesthesiology

## 2011-07-22 ENCOUNTER — Ambulatory Visit (HOSPITAL_COMMUNITY)
Admission: RE | Admit: 2011-07-22 | Discharge: 2011-07-22 | Disposition: A | Discharge status: Home / Self Care | Payer: BC Managed Care – PPO | Source: Ambulatory Visit | Attending: General Surgery | Admitting: General Surgery

## 2011-07-22 ENCOUNTER — Encounter (HOSPITAL_COMMUNITY)
Admission: RE | Disposition: A | Discharge status: Home / Self Care | Payer: Self-pay | Source: Ambulatory Visit | Attending: General Surgery

## 2011-07-22 ENCOUNTER — Ambulatory Visit (HOSPITAL_BASED_OUTPATIENT_CLINIC_OR_DEPARTMENT_OTHER): Admit: 2011-07-22 | Payer: Self-pay | Admitting: General Surgery

## 2011-07-22 DIAGNOSIS — C50919 Malignant neoplasm of unspecified site of unspecified female breast: Secondary | ICD-10-CM | POA: Insufficient documentation

## 2011-07-22 HISTORY — PX: PORTACATH PLACEMENT: SHX2246

## 2011-07-22 LAB — SURGICAL PCR SCREEN: Staphylococcus aureus: POSITIVE — AB

## 2011-07-22 LAB — HCG, SERUM, QUALITATIVE: Preg, Serum: NEGATIVE

## 2011-07-22 LAB — CBC
HCT: 36.5 % (ref 36.0–46.0)
Hemoglobin: 12.6 g/dL (ref 12.0–15.0)
MCH: 32.9 pg (ref 26.0–34.0)
MCHC: 34.5 g/dL (ref 30.0–36.0)

## 2011-07-22 SURGERY — INSERTION, TUNNELED CENTRAL VENOUS DEVICE, WITH PORT
Anesthesia: General

## 2011-07-22 SURGERY — INSERTION, TUNNELED CENTRAL VENOUS DEVICE, WITH PORT
Anesthesia: General | Site: Chest | Wound class: Clean

## 2011-07-22 MED ORDER — MIDAZOLAM HCL 5 MG/5ML IJ SOLN
INTRAMUSCULAR | Status: DC | PRN
  Administered 2011-07-22: 2 mg via INTRAVENOUS

## 2011-07-22 MED ORDER — ONDANSETRON HCL 4 MG/2ML IJ SOLN: 4.0000 mg | Freq: Four times a day (QID) | INTRAMUSCULAR | Status: DC | PRN

## 2011-07-22 MED ORDER — FENTANYL CITRATE 0.05 MG/ML IJ SOLN
INTRAMUSCULAR | Status: DC | PRN
  Administered 2011-07-22 (×2): 50 ug via INTRAVENOUS

## 2011-07-22 MED ORDER — SODIUM CHLORIDE 0.9 % IV SOLN: INTRAVENOUS | Status: DC

## 2011-07-22 MED ORDER — SODIUM CHLORIDE 0.9 % IV SOLN: 250.0000 mL | INTRAVENOUS | Status: DC | PRN

## 2011-07-22 MED ORDER — OXYCODONE-ACETAMINOPHEN 5-325 MG PO TABS: 1.0000 | ORAL_TABLET | ORAL | Status: AC | PRN

## 2011-07-22 MED ORDER — BUPIVACAINE HCL (PF) 0.25 % IJ SOLN
INTRAMUSCULAR | Status: DC | PRN
  Administered 2011-07-22: 10 mL

## 2011-07-22 MED ORDER — ACETAMINOPHEN 325 MG PO TABS: 650.0000 mg | ORAL_TABLET | ORAL | Status: DC | PRN

## 2011-07-22 MED ORDER — PROPOFOL 10 MG/ML IV EMUL
INTRAVENOUS | Status: DC | PRN
  Administered 2011-07-22: 200 mg via INTRAVENOUS

## 2011-07-22 MED ORDER — OXYCODONE HCL 5 MG PO TABS: 5.0000 mg | ORAL_TABLET | ORAL | Status: DC | PRN

## 2011-07-22 MED ORDER — SODIUM CHLORIDE 0.9 % IJ SOLN: 3.0000 mL | INTRAMUSCULAR | Status: DC | PRN

## 2011-07-22 MED ORDER — ONDANSETRON HCL 4 MG/2ML IJ SOLN
INTRAMUSCULAR | Status: DC | PRN
  Administered 2011-07-22: 4 mg via INTRAVENOUS

## 2011-07-22 MED ORDER — MUPIROCIN 2 % EX OINT
TOPICAL_OINTMENT | CUTANEOUS | Status: AC
  Filled 2011-07-22: qty 22

## 2011-07-22 MED ORDER — HYDROMORPHONE HCL PF 1 MG/ML IJ SOLN: 0.2500 mg | INTRAMUSCULAR | Status: DC | PRN

## 2011-07-22 MED ORDER — LACTATED RINGERS IV SOLN
INTRAVENOUS | Status: DC | PRN
  Administered 2011-07-22 (×2): via INTRAVENOUS

## 2011-07-22 MED ORDER — ACETAMINOPHEN 650 MG RE SUPP: 650.0000 mg | RECTAL | Status: DC | PRN

## 2011-07-22 MED ORDER — SODIUM CHLORIDE 0.9 % IR SOLN
Status: DC | PRN
  Administered 2011-07-22: 08:00:00

## 2011-07-22 MED ORDER — HEPARIN SOD (PORK) LOCK FLUSH 100 UNIT/ML IV SOLN
INTRAVENOUS | Status: DC | PRN
  Administered 2011-07-22: 500 [IU] via INTRAVENOUS

## 2011-07-22 MED ORDER — SODIUM CHLORIDE 0.9 % IJ SOLN: 3.0000 mL | Freq: Two times a day (BID) | INTRAMUSCULAR | Status: DC

## 2011-07-22 MED ORDER — MORPHINE SULFATE 2 MG/ML IJ SOLN: 2.0000 mg | INTRAMUSCULAR | Status: DC | PRN

## 2011-07-22 MED ORDER — CEFAZOLIN SODIUM 1-5 GM-% IV SOLN
INTRAVENOUS | Status: AC
  Filled 2011-07-22: qty 50

## 2011-07-22 MED ORDER — LIDOCAINE HCL (CARDIAC) 20 MG/ML IV SOLN
INTRAVENOUS | Status: DC | PRN
  Administered 2011-07-22: 100 mg via INTRAVENOUS

## 2011-07-22 SURGICAL SUPPLY — 47 items
BAG DECANTER FOR FLEXI CONT (MISCELLANEOUS) ×2 IMPLANT
BLADE SURG 11 STRL SS (BLADE) ×2 IMPLANT
BLADE SURG 15 STRL LF DISP TIS (BLADE) ×1 IMPLANT
BLADE SURG 15 STRL SS (BLADE) ×1
CHLORAPREP W/TINT 26ML (MISCELLANEOUS) ×2 IMPLANT
CLOTH BEACON ORANGE TIMEOUT ST (SAFETY) ×2 IMPLANT
COVER SURGICAL LIGHT HANDLE (MISCELLANEOUS) ×2 IMPLANT
CRADLE DONUT ADULT HEAD (MISCELLANEOUS) ×2 IMPLANT
DECANTER SPIKE VIAL GLASS SM (MISCELLANEOUS) ×2 IMPLANT
DERMABOND ADVANCED (GAUZE/BANDAGES/DRESSINGS) ×1
DERMABOND ADVANCED .7 DNX12 (GAUZE/BANDAGES/DRESSINGS) ×1 IMPLANT
DRAPE C-ARM 42X72 X-RAY (DRAPES) ×2 IMPLANT
DRAPE LAPAROSCOPIC ABDOMINAL (DRAPES) ×2 IMPLANT
ELECT CAUTERY BLADE 6.4 (BLADE) ×2 IMPLANT
ELECT REM PT RETURN 9FT ADLT (ELECTROSURGICAL) ×2
ELECTRODE REM PT RTRN 9FT ADLT (ELECTROSURGICAL) ×1 IMPLANT
GAUZE SPONGE 4X4 16PLY XRAY LF (GAUZE/BANDAGES/DRESSINGS) ×2 IMPLANT
GLOVE BIO SURGEON STRL SZ7 (GLOVE) ×2 IMPLANT
GLOVE BIOGEL PI IND STRL 7.5 (GLOVE) ×1 IMPLANT
GLOVE BIOGEL PI INDICATOR 7.5 (GLOVE) ×1
GOWN STRL NON-REIN LRG LVL3 (GOWN DISPOSABLE) ×4 IMPLANT
INTRODUCER COOK 11FR (CATHETERS) IMPLANT
KIT BASIN OR (CUSTOM PROCEDURE TRAY) ×2 IMPLANT
KIT PORT POWER 9.6FR MRI PREA (Catheter) IMPLANT
KIT PORT POWER ISP 8FR (Catheter) IMPLANT
KIT POWER CATH 8FR (Catheter) ×2 IMPLANT
KIT ROOM TURNOVER OR (KITS) ×2 IMPLANT
NEEDLE HYPO 25GX1X1/2 BEV (NEEDLE) ×2 IMPLANT
NS IRRIG 1000ML POUR BTL (IV SOLUTION) ×2 IMPLANT
PACK SURGICAL SETUP 50X90 (CUSTOM PROCEDURE TRAY) ×2 IMPLANT
PAD ARMBOARD 7.5X6 YLW CONV (MISCELLANEOUS) ×4 IMPLANT
PENCIL BUTTON HOLSTER BLD 10FT (ELECTRODE) ×2 IMPLANT
SET INTRODUCER 12FR PACEMAKER (SHEATH) IMPLANT
SET SHEATH INTRODUCER 10FR (MISCELLANEOUS) IMPLANT
SHEATH COOK PEEL AWAY SET 9F (SHEATH) IMPLANT
SUT MNCRL AB 4-0 PS2 18 (SUTURE) ×2 IMPLANT
SUT PROLENE 2 0 SH 30 (SUTURE) ×2 IMPLANT
SUT SILK 2 0 (SUTURE) ×1
SUT SILK 2-0 18XBRD TIE 12 (SUTURE) ×1 IMPLANT
SUT VIC AB 3-0 SH 27 (SUTURE) ×1
SUT VIC AB 3-0 SH 27XBRD (SUTURE) ×1 IMPLANT
SYR 20ML ECCENTRIC (SYRINGE) ×4 IMPLANT
SYR 5ML LUER SLIP (SYRINGE) ×2 IMPLANT
SYR CONTROL 10ML LL (SYRINGE) ×4 IMPLANT
TOWEL OR 17X24 6PK STRL BLUE (TOWEL DISPOSABLE) ×2 IMPLANT
TOWEL OR 17X26 10 PK STRL BLUE (TOWEL DISPOSABLE) ×2 IMPLANT
WATER STERILE IRR 1000ML POUR (IV SOLUTION) IMPLANT

## 2011-07-22 NOTE — Anesthesia Postprocedure Evaluation (Signed)
Anesthesia Post Note  Patient: Kelly Mills  Procedure(s) Performed: Procedure(s) (LRB): INSERTION PORT-A-CATH (N/A)  Anesthesia type: General  Patient location: PACU  Post pain: Pain level controlled and Adequate analgesia  Post assessment: Post-op Vital signs reviewed, Patient's Cardiovascular Status Stable, Respiratory Function Stable, Patent Airway and Pain level controlled  Last Vitals:  Filed Vitals:   07/22/11 0837  BP:   Pulse:   Temp: 36.3 C  Resp:     Post vital signs: Reviewed and stable  Level of consciousness: awake, alert  and oriented  Complications: No apparent anesthesia complications

## 2011-07-22 NOTE — H&P (View-Only) (Signed)
Patient ID: Kelly Mills, female   DOB: 06-12-1970, 41 y.o.   MRN: [id number]  Chief Complaint  Patient presents with  . Other    breast cancer    HPI Kelly Mills is a 41 y.o. female.  Referred by Dr. Anselmo Pickler HPI This is a healthy 54 yof who presents since January with a right breast mass.  This is not associated with tenderness, nipple discharge and has not changed since she noticed it.  She underwent diagnostic mmg showing an ill defined irregular right breast mass.  Ultrasound shows a 1.8x1.7x1.6 cm mass.  In the right axilla she was also noted to have an abnormal lymph node.  U/S guided core biopsy of breast and axillary lesions was performed.   A left sided mmg was also performed and this is negative.  Breast MRI showed an irregular enhancing mass measuring 3.3 cm in size in the right breast with a 1.1 cm satellite nodule located 8mm away.  This entire area measures 4.2 cm in size.  There are also two abnormal nodes in the axilla.  Pathology of the node shows metastatic mammary carcinoma.  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  This is  her2neu not amplified, er pos at 98, pr pos at8 and Ki67 is 84%.  She comes in today to discuss options at [facility].  History reviewed. No pertinent past medical history.  Past Surgical History  Procedure Date  . Nasal septum surgery     Family History  Problem Relation Age of Onset  . Cancer Maternal Grandfather     Social History History  Substance Use Topics  . Smoking status: Never Smoker   . Smokeless tobacco: Not on file  . Alcohol Use: Yes    Allergies  Allergen Reactions  . Codeine Nausea Only    Current Outpatient Prescriptions  Medication Sig Dispense Refill  . Multiple Vitamin (MULTIVITAMIN) capsule Take 1 capsule by mouth daily.      . TRAZODONE HCL PO Take by mouth as needed.      . Zolpidem Tartrate (AMBIEN PO) Take by mouth at bedtime as needed.        Review of Systems Review  of Systems  Constitutional: Negative for fever, chills and unexpected weight change.  HENT: Negative for hearing loss, congestion, sore throat, trouble swallowing and voice change.   Eyes: Negative for visual disturbance.  Respiratory: Negative for cough and wheezing.   Cardiovascular: Negative for chest pain, palpitations and leg swelling.  Gastrointestinal: Positive for abdominal pain. Negative for nausea, vomiting, diarrhea, constipation, blood in stool, abdominal distention and anal bleeding.  Genitourinary: Negative for hematuria, vaginal bleeding and difficulty urinating.  Musculoskeletal: Negative for arthralgias.  Skin: Negative for rash and wound.  Neurological: Negative for seizures, syncope and headaches.  Hematological: Negative for adenopathy. Does not bruise/bleed easily.  Psychiatric/Behavioral: Negative for confusion.    Last menstrual period 06/13/2011.  Physical Exam Physical Exam  Vitals reviewed. Constitutional: She appears well-developed and well-nourished.  Neck: Neck supple.  Cardiovascular: Normal rate, regular rhythm and normal heart sounds.   Pulmonary/Chest: Effort normal and breath sounds normal. She has no wheezes. She has no rales. Right breast exhibits mass. Right breast exhibits no inverted nipple, no nipple discharge, no skin change and no tenderness. Left breast exhibits no inverted nipple, no mass, no nipple discharge, no skin change and no tenderness. Breasts are symmetrical.    Abdominal: Soft. Normal appearance. There is no hepatomegaly.    Lymphadenopathy:  She has no cervical adenopathy.    She has axillary adenopathy.       Right axillary: Lateral adenopathy present. No pectoral adenopathy present.       Left axillary: No pectoral and no lateral adenopathy present.      Right: No supraclavicular adenopathy present.       Left: No supraclavicular adenopathy present.    Data Reviewed BILATERAL BREAST MRI WITH AND WITHOUT CONTRAST    Technique: Multiplanar, multisequence MR images of both breasts  were obtained prior to and following the intravenous administration  of 15ml of Multihance. Three dimensional images were evaluated at  the independent DynaCad workstation.  Comparison: 07/08/2011, 07/03/2011, 10/09/2010.  Findings: There is an irregular enhancing mass located laterally  within the posterior third of the right breast at the 9 o'clock  position with central clip artifact consistent with the recently  diagnosed invasive mammary carcinoma. This mass measures 3.3 x 2.3  x 2.0 cm in size. There is an enhancing 1.1 x 0.8 x 0.9 cm  satellite nodule located 8 mm posterior and inferior to the larger  mass. In aggregate the larger mass and the satellite nodule  measure 4.2 cm in greatest dimension. Both of these masses are  associated with washout enhancement kinetics. There are no  additional or worrisome enhancing lesions within the right breast  or within the left breast.  There are two adjacent prominent level I right axillary lymph nodes  with loss of the normal fatty hilum worrisome for metastatic lymph  nodes (the patient has undergone recent ultrasound guided core  biopsy ). The larger of the two lymph nodes measures 1.6 cm in  greatest dimension. There are no abnormal left axillary lymph  nodes or internal mammary lymph nodes. There are no additional  findings.  IMPRESSION:  1. Irregular enhancing mass with washout kinetics measuring  maximally 3.3 cm in size located within the right breast at the 9  o'clock position corresponding to the recently biopsied invasive  mammary carcinoma. There is a 1.1 cm satellite nodule located 8 mm  posterior and inferior to this larger mass which is also worrisome  for invasive mammary carcinoma. In aggregate, the larger mass and  the satellite mass measure 4.2 cm in greatest dimension.  2. Two adjacent abnormal appearing level I right axillary lymph  nodes worrisome  for metastatic lymph nodes as discussed above. No  additional findings.    Assessment   Locally advanced right breast cancer    Plan    Port placement for primary systemic chemotherapy  1.  Genetic testing 2.  Port placement was discussed to begin chemotherapy and will schedule asap.  I showed her the port and where it would be placed and how it would be used.  Risks of insertion including bleeding, infection, and pneumothorax were discussed.   3.  We discussed surgery for breast cancer at length today.  We discussed at time of surgery she will need an axillary node dissection due to her node positivity right now.  I discussed that right now she would require a mastectomy.  She is interested in breast conservation therapy.  I will ask radiology to place a clip in the satellite nodule before proceeding but I think primary chemo is best for her disease as well as to possibly decrease size to make her a candidate for BCT.  We discussed about a 2/3s chance of being able to do this.  I will plan on seeing her at end of  chemo with repeat MRI and then she and I can decide how to proceed with surgery. 4. Metastatic workup with med onc       Kelly Mills 07/17/2011, 4:57 PM

## 2011-07-22 NOTE — Preoperative (Signed)
Beta Blockers   Reason not to administer Beta Blockers:Not Applicable 

## 2011-07-22 NOTE — Anesthesia Preprocedure Evaluation (Signed)
Anesthesia Evaluation  Patient identified by MRN, date of birth, ID band Patient awake    Reviewed: Allergy & Precautions, H&P , NPO status , Patient's Chart, lab work & pertinent test results  Airway Mallampati: II  Neck ROM: full    Dental   Pulmonary          Cardiovascular     Neuro/Psych    GI/Hepatic   Endo/Other    Renal/GU      Musculoskeletal   Abdominal   Peds  Hematology   Anesthesia Other Findings   Reproductive/Obstetrics                           Anesthesia Physical Anesthesia Plan  ASA: II  Anesthesia Plan: General   Post-op Pain Management:    Induction: Intravenous  Airway Management Planned: LMA  Additional Equipment:   Intra-op Plan:   Post-operative Plan:   Informed Consent: I have reviewed the patients History and Physical, chart, labs and discussed the procedure including the risks, benefits and alternatives for the proposed anesthesia with the patient or authorized representative who has indicated his/her understanding and acceptance.     Plan Discussed with: CRNA and Surgeon  Anesthesia Plan Comments:         Anesthesia Quick Evaluation  

## 2011-07-22 NOTE — Interval H&P Note (Signed)
History and Physical Interval Note:  07/22/2011 8:25 AM  Kelly Mills  has presented today for surgery, with the diagnosis of Brst. Cancer  The various methods of treatment have been discussed with the patient and family. After consideration of risks, benefits and other options for treatment, the patient has consented to  Procedure(s) (LRB): INSERTION PORT-A-CATH (N/A) as a surgical intervention .  The patients' history has been reviewed, patient examined, no change in status, stable for surgery.  I have reviewed the patients' chart and labs.  Questions were answered to the patient's satisfaction.     [name]

## 2011-07-22 NOTE — Op Note (Signed)
Preoperative diagnosis: Locally advanced right breast cancer Postoperative diagnosis: Same as above Procedure: Left subclavian MRI compatible power port insertion Surgeon: Dr. Harden Mo Anesthesia: Gen. Complications: None Specimens: None Drains: None Disposition patient to recovery in stable condition Sponge and needle count correct x2 at end of operation  Indications: This is a 41 year old female with a locally advanced right breast cancer. She was seen a multidisciplinary clinic and it was decided to proceed with primary systemic chemotherapy. We discussed port placement and the risks and benefits associated with that.  Procedure: After informed consent was obtained the patient was taken to the operating room. She had sequential compression devices placed on her leg prior to induction. She was administered 1 g of intravenous cefazolin. She was then placed under general anesthesia with an LMA. Her arms were tucked and  properly padded. Her chest was then prepped and draped in the standard sterile surgical fashion. A surgical timeout was then performed.  I accessed her left subclavian vein on the first pass. The wire was placed and this was confirmed to be in position by fluoroscopy. I then made an incision below this and made a pocket overlying the pectoralis fascia. I then tunneled a line between the 2 sites. I then placed the dilator. I removed the dilator and placed the peel-away sheath. The wire assembly was then removed. I then placed a line inside the peel-away sheath. This was then removed. Fluoroscopy was then used to view the line coming back into the distal cava. This was in good position. I then cut the line and connected to the port. This was sutured in 3 positions with 2-0 Prolene suture to the pectoralis fascia. This then aspirated blood and flushed easily. It was packed with heparin. I then closes with 3-0 Vicryl and 4 Monocryl with Dermabond over the skin. Final fluoroscopy  image showed that the line was in good position without any kinks. She tolerated this well was extubated and transferred to recovery stable.

## 2011-07-22 NOTE — Discharge Instructions (Signed)
[facility] Office Phone Number [phone]  POST OP INSTRUCTIONS  Always review your discharge instruction sheet given to you by the facility where your surgery was performed.  IF YOU HAVE DISABILITY OR FAMILY LEAVE FORMS, YOU MUST BRING THEM TO THE OFFICE FOR PROCESSING.  DO NOT GIVE THEM TO YOUR DOCTOR.  1. A prescription for pain medication may be given to you upon discharge.  Take your pain medication as prescribed, if needed.  If narcotic pain medicine is not needed, then you may take acetaminophen (Tylenol), naprosyn (Alleve) or ibuprofen (Advil) as needed. 2. Take your usually prescribed medications unless otherwise directed 3. If you need a refill on your pain medication, please contact your pharmacy.  They will contact our office to request authorization.  Prescriptions will not be filled after 5pm or on week-ends. 4. You should eat very light the first 24 hours after surgery, such as soup, crackers, pudding, etc.  Resume your normal diet the day after surgery. 5. Most patients will experience some swelling and bruising in the area of the incision.  Ice packs will help.  Marland Kitchen Swelling and bruising can take several days to resolve.  6. It is common to experience some constipation if taking pain medication after surgery.  Increasing fluid intake and taking a stool softener will usually help or prevent this problem from occurring.  A mild laxative (Milk of Magnesia or Miralax) should be taken according to package directions if there are no bowel movements after 48 hours. 7. Unless discharge instructions indicate otherwise, you may remove your bandages 48 hours after surgery and you may shower at that time.  You may have steri-strips (small skin tapes) in place directly over the incision.  These strips should be left on the skin for 7-10 days and will come off on their own.  If your surgeon used skin glue on the incision, you may shower in 24 hours.  The glue will flake off over the  next 2-3 weeks.  Any sutures or staples will be removed at the office during your follow-up visit. 8. ACTIVITIES:  You may resume regular daily activities (gradually increasing) beginning the next day.   You may have sexual intercourse when it is comfortable. a. You may drive when you no longer are taking prescription pain medication, you can comfortably wear a seatbelt, and you can safely maneuver your car and apply brakes. b. RETURN TO WORK:  ______________________________________________________________________________________ 9. OTHER INSTRUCTIONS: _______________________________________________________________________________________________ _____________________________________________________________________________________________________________________________________ _____________________________________________________________________________________________________________________________________ _____________________________________________________________________________________________________________________________________  WHEN TO CALL DR Cary Wilford: 1. Fever over 101.0 2. Nausea and/or vomiting. 3. Extreme swelling or bruising. 4. Continued bleeding from incision. 5. Increased pain, redness, or drainage from the incision.  The clinic staff is available to answer your questions during regular business hours.  Please don't hesitate to call and ask to speak to one of the nurses for clinical concerns.  If you have a medical emergency, go to the nearest emergency room or call 911.  A surgeon from [facility] is always on call at the hospital.  For further questions, please visit [email or web address]

## 2011-07-22 NOTE — Transfer of Care (Signed)
Immediate Anesthesia Transfer of Care Note  Patient: Kelly Mills  Procedure(s) Performed: Procedure(s) (LRB): INSERTION PORT-A-CATH (N/A)  Patient Location: PACU  Anesthesia Type: General  Level of Consciousness: awake  Airway & Oxygen Therapy: Patient Spontanous Breathing  Post-op Assessment: Report given to PACU RN and Post -op Vital signs reviewed and stable  Post vital signs: Reviewed and stable  Complications: No apparent anesthesia complications

## 2011-07-22 NOTE — Anesthesia Procedure Notes (Signed)
Procedure Name: LMA Insertion Date/Time: 07/22/2011 7:40 AM Performed by: Malachi Pro Pre-anesthesia Checklist: Patient identified, Emergency Drugs available, Suction available, Patient being monitored and Timeout performed Patient Re-evaluated:Patient Re-evaluated prior to inductionOxygen Delivery Method: Circle system utilized Preoxygenation: Pre-oxygenation with 100% oxygen Intubation Type: IV induction LMA: LMA inserted and LMA with gastric port inserted LMA Size: 4.0 Number of attempts: 1 Placement Confirmation: positive ETCO2 and breath sounds checked- equal and bilateral Tube secured with: Tape Dental Injury: Teeth and Oropharynx as per pre-operative assessment

## 2011-07-22 NOTE — Telephone Encounter (Signed)
Leigh calling to notify us that the pt did receive a positive path report for breast cancer on the 2nd bx. The area was biopsied and clip placement per Leigh. Dr Dwain Sarna was notified.

## 2011-07-23 MED FILL — Mupirocin Oint 2%: CUTANEOUS | Qty: 22 | Status: AC

## 2011-07-23 MED FILL — Cefazolin in D5W Inj 1 GM/50ML: INTRAVENOUS | Qty: 50 | Status: AC

## 2011-07-24 ENCOUNTER — Ambulatory Visit (HOSPITAL_COMMUNITY): Admission: RE | Admit: 2011-07-24 | Payer: BC Managed Care – PPO | Source: Ambulatory Visit | Admitting: General Surgery

## 2011-07-24 ENCOUNTER — Encounter (HOSPITAL_COMMUNITY): Admission: RE | Payer: Self-pay | Source: Ambulatory Visit

## 2011-07-24 SURGERY — INSERTION, TUNNELED CENTRAL VENOUS DEVICE, WITH PORT
Anesthesia: General

## 2011-07-25 ENCOUNTER — Encounter: Payer: Self-pay | Admitting: Genetic Counselor

## 2011-07-25 ENCOUNTER — Telehealth: Payer: Self-pay | Admitting: *Deleted

## 2011-07-25 NOTE — Telephone Encounter (Signed)
Spoke to pt concerning [facility] from 07/17/11.  Pt denies questions or concerns regarding dx or treatment care plan.  Encourage pt to call with needs.  Confirmed appts for CT/PET, Echo and f/u with Dr. Donnie Coffin.  Contact information given.

## 2011-07-26 ENCOUNTER — Encounter: Payer: Self-pay | Admitting: Genetic Counselor

## 2011-07-26 ENCOUNTER — Telehealth: Payer: Self-pay | Admitting: Genetic Counselor

## 2011-07-26 NOTE — Telephone Encounter (Signed)
Patient told of negative BRCA test results.  She stated that she needs to complete the paperwork on the financial assistance program.  I encouraged her to do that ASAP.

## 2011-07-29 ENCOUNTER — Encounter (HOSPITAL_COMMUNITY): Payer: Self-pay | Admitting: General Surgery

## 2011-07-29 ENCOUNTER — Encounter (HOSPITAL_COMMUNITY)
Admission: RE | Admit: 2011-07-29 | Discharge: 2011-07-29 | Disposition: A | Discharge status: Home / Self Care | Payer: BC Managed Care – PPO | Source: Ambulatory Visit | Attending: Oncology | Admitting: Oncology

## 2011-07-29 DIAGNOSIS — C50919 Malignant neoplasm of unspecified site of unspecified female breast: Secondary | ICD-10-CM | POA: Insufficient documentation

## 2011-07-29 DIAGNOSIS — C50419 Malignant neoplasm of upper-outer quadrant of unspecified female breast: Secondary | ICD-10-CM

## 2011-07-29 MED ORDER — FLUDEOXYGLUCOSE F - 18 (FDG) INJECTION
16.0000 | Freq: Once | INTRAVENOUS | Status: AC | PRN
  Administered 2011-07-29: 16 via INTRAVENOUS

## 2011-07-30 ENCOUNTER — Ambulatory Visit (HOSPITAL_COMMUNITY)
Admission: RE | Admit: 2011-07-30 | Discharge: 2011-07-30 | Disposition: A | Discharge status: Home / Self Care | Payer: BC Managed Care – PPO | Source: Ambulatory Visit | Attending: Oncology | Admitting: Oncology

## 2011-07-30 ENCOUNTER — Telehealth: Payer: Self-pay | Admitting: *Deleted

## 2011-07-30 DIAGNOSIS — C50419 Malignant neoplasm of upper-outer quadrant of unspecified female breast: Secondary | ICD-10-CM | POA: Insufficient documentation

## 2011-07-30 DIAGNOSIS — Z09 Encounter for follow-up examination after completed treatment for conditions other than malignant neoplasm: Secondary | ICD-10-CM

## 2011-07-30 NOTE — Telephone Encounter (Signed)
Pt called requesting a second opinion.  Pt relate she is happy with her team, she just wants a "second pair of eyes" to look at her care plan.  Referral sent to [facility].  Pt made aware of referral sent.

## 2011-07-30 NOTE — Progress Notes (Signed)
  Echocardiogram 2D Echocardiogram has been performed.  Kelly Mills L 07/30/2011, 10:54 AM

## 2011-08-01 ENCOUNTER — Telehealth: Payer: Self-pay | Admitting: Oncology

## 2011-08-01 ENCOUNTER — Ambulatory Visit (HOSPITAL_BASED_OUTPATIENT_CLINIC_OR_DEPARTMENT_OTHER): Payer: BC Managed Care – PPO | Admitting: Oncology

## 2011-08-01 VITALS — BP 136/85 | HR 63 | Temp 98.5°F | Ht 65.0 in | Wt 156.8 lb

## 2011-08-01 DIAGNOSIS — Z170 Estrogen receptor positive status [ER+]: Secondary | ICD-10-CM

## 2011-08-01 DIAGNOSIS — Z938 Other artificial opening status: Secondary | ICD-10-CM

## 2011-08-01 DIAGNOSIS — C50919 Malignant neoplasm of unspecified site of unspecified female breast: Secondary | ICD-10-CM

## 2011-08-01 DIAGNOSIS — C50419 Malignant neoplasm of upper-outer quadrant of unspecified female breast: Secondary | ICD-10-CM

## 2011-08-01 MED ORDER — PROCHLORPERAZINE 25 MG RE SUPP: 25.0000 mg | Freq: Two times a day (BID) | RECTAL | Status: DC | PRN

## 2011-08-01 MED ORDER — PROCHLORPERAZINE MALEATE 10 MG PO TABS: 10.0000 mg | ORAL_TABLET | Freq: Four times a day (QID) | ORAL | Status: DC | PRN

## 2011-08-01 MED ORDER — DEXAMETHASONE 4 MG PO TABS: ORAL_TABLET | ORAL | Status: DC

## 2011-08-01 MED ORDER — LIDOCAINE-PRILOCAINE 2.5-2.5 % EX CREA: TOPICAL_CREAM | Freq: Once | CUTANEOUS | Status: DC

## 2011-08-01 MED ORDER — ONDANSETRON HCL 8 MG PO TABS: ORAL_TABLET | ORAL | Status: DC

## 2011-08-01 MED ORDER — LORAZEPAM 0.5 MG PO TABS: 0.5000 mg | ORAL_TABLET | Freq: Four times a day (QID) | ORAL | Status: DC | PRN

## 2011-08-01 NOTE — Telephone Encounter (Signed)
gve the pt her April 2013 appt calendar 

## 2011-08-01 NOTE — Progress Notes (Signed)
Referral MD Dr Juanita Craver; Dr Dois Davenport Rivard  Reason for Referral: Pleasant 41 year old woman referred for evaluation of breast mass.    Chief Complaint  Patient presents with  . Breast Cancer  : The patient has been in good health. She has had previous mammograms. Last mammogram was in May 2012. She self detected a mass in her left breast in January of this year. She sought medical attention for this and was referred for mammogram and ultrasound. This took place on 07/03/2011. At that time a firm palpable mass was noted at 10:00 position. This measured 1.8 x 1.7 x 1.6 cm. Prominent lymph node was seen in the axilla. Biopsies of both areas were was performed. Both the breast and lymph node showed invasive mammary carcinoma he ER status was 98% PR +80%, proliferative index 84% HER-2 was negative with a ratio 0.96 MRI scan of both breasts performed 07/13/2011 showed a mass measuring 3.3 x 2.3 x 2 cm. In addition there was an enhancing nodule located 8 mm posterior inferior to the larger mass measuring 1.1 x 0.8 x 0.9 cm. In aggregate this total area measured 4.2 cm.   HPI:  History reviewed. No pertinent past medical history.:  Past Surgical History  Procedure Date  . Nasal septum surgery   :  Current outpatient prescriptions:Multiple Vitamin (MULTIVITAMIN) capsule, Take 1 capsule by mouth daily., Disp: , Rfl: ;  TRAZODONE HCL PO, Take by mouth as needed., Disp: , Rfl: ;  Zolpidem Tartrate (AMBIEN PO), Take by mouth at bedtime as needed., Disp: , Rfl: :    :  Allergies  Allergen Reactions  . Codeine Nausea Only  :  Family History  Problem Relation Age of Onset  . Cancer Maternal Grandfather  melanoma   :  History   Social History  . Marital Status: Married x 16y    Spouse Name: Benson [place] - IT consultant    Number of Children: N/A  . Years of Education: N/A, graduated from [employer]- MFA creative writing   Occupational History  . 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 Smoking status: Never Smoker   . Smokeless tobacco: Not on file  . Alcohol Use: Yes  . Drug Use:   . Sexually Active:    Other Topics Concern  . Not on file   Social History Narrative  .  2 children  12 and 7  :Reproductive History Hx of frequent clomid injections and other fertility drugs over 2 yrs G5P2   A comprehensive review of systems was negative.  Exam:  @IPVITALS @ General appearance: alert and cooperative Eyes: conjunctivae/corneas clear. PERRL, EOM's intact. Fundi benign. Throat: lips, mucosa, and tongue normal; teeth and gums normal Resp: clear to auscultation bilaterally and normal percussion bilaterally Breasts: normal appearance, 3-5 cm breast mass lateral aspect of rt breast. Cardio: regular rate and rhythm, S1, S2 normal, no murmur, click, rub or gallop and normal apical impulse GI: soft, non-tender; bowel sounds normal; no masses,  no organomegaly Extremities: extremities normal, atraumatic, no cyanosis or edema Pulses: 2+ and symmetric Lymph nodes: Cervical, supraclavicular, and axillary nodes normal. Neurologic: Alert and oriented X 3, normal strength and tone. Normal symmetric reflexes. Normal coordination and gait  No results found for this basename: WBC:2,HGB:2,HCT:2,PLT:2 in the last 72 hours No results found for this basename: NA:2,K:2,CL:2,CO2:2,GLUCOSE:2,BUN:2,CREATININE:2,CALCIUM:2 in the last 72 hours  Blood smear review: n/a  Pathology:as above  US Breast Right  07/03/2011  *  RADIOLOGY REPORT*  Clinical Data:  Questioned palpable finding per patient right upper outer quadrant  DIGITAL DIAGNOSTIC RIGHT MAMMOGRAM WITH CAD AND RIGHT BREAST ULTRASOUND:  Comparison:  Prior exams, bilateral screening mammography 10/09/2010  Findings:  There is an ill-defined irregular mass-like density in the right upper outer quadrant at the site of the questioned palpable finding.  The right breast is heterogeneously dense.  Compared with prior exam, this finding is new.  No suspicious calcification is identified. Mammographic images were processed with CAD.  On physical exam, I palpate a firm mass in the right breast 10 o'clock location.  Ultrasound is performed, showing an irregular hypoechoic shadowing mass in the right breast 10 o'clock location 5 cm from the nipple measuring 1.8 x 1.7 x 1.6 cm. In the right axilla, there is a lymph node with nodular prominent cortex measuring 0.6 cm in maximal short axis diameter.  IMPRESSION: Suspicious right breast mass 10 o'clock location and abnormal right axillary lymph node.  Ultrasound-guided core biopsy is recommended of both areas and will be scheduled at the patient's convenience. Findings and recommendations discussed with the patient and provided in written form at the time of the exam.  I also discussed these findings with Vernona Rieger in the office of Dr. Estanislado Pandy at the time of imaging.  BI-RADS CATEGORY 4:  Suspicious abnormality - biopsy should be considered.  Recommendation:  Right breast ultrasound guided core biopsy and right axillary lymph node biopsy  Original Report Authenticated By: Harrel Lemon, M.D.   Mr Breast Bilateral W Wo Contrast  07/15/2011  *RADIOLOGY REPORT*  Clinical Data: Recently diagnosed right breast invasive mammary carcinoma with metastases within a right axillary lymph nodes. Preoperative evaluation.  BILATERAL BREAST MRI WITH AND WITHOUT CONTRAST  Technique: Multiplanar, multisequence MR images of both breasts were obtained prior to and following the intravenous administration of 15ml of Multihance.  Three dimensional images were evaluated at the independent DynaCad workstation.  Comparison:  07/08/2011, 07/03/2011, 10/09/2010.  Findings: There is an irregular enhancing mass located laterally within the posterior third of the right breast at the 9 o'clock position with central clip artifact consistent with the recently diagnosed invasive mammary carcinoma.   This mass measures 3.3 x 2.3 x 2.0 cm in size.  There is an enhancing  1.1 x 0.8 x 0.9 cm satellite nodule located 8 mm posterior and inferior to the larger mass.  In aggregate the larger mass and the satellite nodule measure 4.2 cm in greatest dimension.  Both of these masses are associated with washout enhancement kinetics.  There are no additional or worrisome enhancing lesions within the right breast or within the left breast.  There are two adjacent prominent level I right axillary lymph nodes with loss of the normal fatty hilum worrisome for metastatic lymph nodes (the patient has undergone recent ultrasound guided core biopsy ).  The larger of the two lymph nodes measures 1.6 cm in greatest dimension.  There are no abnormal left axillary lymph nodes or internal mammary lymph nodes.  There are no additional findings.  IMPRESSION:  1.  Irregular enhancing mass with washout kinetics measuring maximally 3.3 cm in size located within the right breast at the 9 o'clock position corresponding to the recently biopsied invasive mammary carcinoma.  There is a 1.1 cm satellite nodule located 8 mm posterior and inferior to this larger mass which is also worrisome for invasive mammary carcinoma.  In aggregate, the larger mass and the satellite mass measure  4.2 cm in greatest  dimension. 2.  Two adjacent abnormal appearing level I right axillary lymph nodes worrisome for metastatic lymph nodes as discussed above.  No additional findings.  THREE-DIMENSIONAL MR IMAGE RENDERING ON INDEPENDENT WORKSTATION:  Three-dimensional MR images were rendered by post-processing of the original MR data on an independent workstation.  The three- dimensional MR images were interpreted, and findings were reported in the accompanying complete MRI report for this study.  BI-RADS CATEGORY 6:  Known biopsy-proven malignancy - appropriate action should be taken.  Original Report Authenticated By: Rolla Plate, M.D.   [place] Core  Biopsy  07/09/2011  *RADIOLOGY REPORT*  Clinical Data:  Palpable mass at 9 o'clock 7 cm from the right nipple with suspicious features at mammography and sonography.  ULTRASOUND GUIDED VACUUM ASSISTED CORE BIOPSY OF THE RIGHT BREAST  The patient and I discussed the procedure of ultrasound-guided biopsy, including benefits and alternatives.  We discussed the high likelihood of a successful procedure. We discussed the risks of the procedure, including infection, bleeding, tissue injury, clip migration and inadequate sampling.  Informed written consent was given.  Using sterile technique, 2% lidocaine, ultrasound guidance and a 12 gauge vacuum assisted needle, biopsy was performed of the mass at 9 o'clock 7 cm from the right nipple.  At the conclusion of the procedure, a ribbon tissue marker clip was deployed into the biopsy cavity.  Follow-up 2-view mammogram was performed and dictated separately.  IMPRESSION: Ultrasound-guided biopsy of a mass at 9 o'clock 7 cm from the right nipple.  No apparent complications.  Original Report Authenticated By: Daryl Eastern, M.D.   [place] Core Biopsy  07/09/2011  *RADIOLOGY REPORT*  Clinical Data:  The patient feels a mass in the 9 o'clock position of the right breast.  An abnormal right axillary lymph node with a thickened cortex was seen at sonography.  This biopsy is of the right axillary lymph node.  ULTRASOUND GUIDED CORE BIOPSY OF THE RIGHT AXILLA  The patient and I discussed the procedure of ultrasound-guided biopsy, including benefits and alternatives.  We discussed the high likelihood of a successful procedure. We discussed the risks of the procedure, including infection, bleeding, tissue injury, clip migration and inadequate sampling.  Informed written consent was given.  Using sterile technique, 2% lidocaine, ultrasound guidance and a 14 gauge automated biopsy device, biopsy was performed of the right axillary lymph node.  IMPRESSION: Ultrasound guided biopsy of an  abnormal right axillary lymph node. No apparent complications.  Original Report Authenticated By: Daryl Eastern, M.D.   Mm Digital Diagnostic Unilat L  07/09/2011  *RADIOLOGY REPORT*  Clinical Data:  Pre MRI mammogram of the left breast.  The patient was recently diagnosed with the right breast cancer.  DIGITAL DIAGNOSTIC LEFT MAMMOGRAM WITH CAD  Comparison:  10/09/2010  Findings:  There are scattered fibroglandular densities.  There is no dominant mass, architectural distortion or calcification to suggest malignancy. Mammographic images were processed with CAD.  IMPRESSION: No mammographic evidence of malignancy in the left breast.  Yearly mammography is suggested.  BI-RADS CATEGORY 1:  Negative.  Original Report Authenticated By: Daryl Eastern, M.D.   Mm Digital Diagnostic Unilat R  07/09/2011  *RADIOLOGY REPORT*  Clinical Data:  Ultrasound-guided core needle biopsy of a mass at 9 o'clock in the right breast with clip placement.  DIGITAL DIAGNOSTIC RIGHT MAMMOGRAM  Comparison:  None.  Findings:  Films are performed following ultrasound guided biopsy of a mass at 9 o'clock on the right.  The InRad ribbon clip is  appropriately positioned.  IMPRESSION: Appropriate clip placement following ultrasound-guided core needle biopsy of a mass at 9 o'clock in the right.  Original Report Authenticated By: Daryl Eastern, M.D.   Mm Digital Diagnostic Unilat R  07/03/2011  *RADIOLOGY REPORT*  Clinical Data:  Questioned palpable finding per patient right upper outer quadrant  DIGITAL DIAGNOSTIC RIGHT MAMMOGRAM WITH CAD AND RIGHT BREAST ULTRASOUND:  Comparison:  Prior exams, bilateral screening mammography 10/09/2010  Findings:  There is an ill-defined irregular mass-like density in the right upper outer quadrant at the site of the questioned palpable finding.  The right breast is heterogeneously dense. Compared with prior exam, this finding is new.  No suspicious calcification is identified. Mammographic  images were processed with CAD.  On physical exam, I palpate a firm mass in the right breast 10 o'clock location.  Ultrasound is performed, showing an irregular hypoechoic shadowing mass in the right breast 10 o'clock location 5 cm from the nipple measuring 1.8 x 1.7 x 1.6 cm. In the right axilla, there is a lymph node with nodular prominent cortex measuring 0.6 cm in maximal short axis diameter.  IMPRESSION: Suspicious right breast mass 10 o'clock location and abnormal right axillary lymph node.  Ultrasound-guided core biopsy is recommended of both areas and will be scheduled at the patient's convenience. Findings and recommendations discussed with the patient and provided in written form at the time of the exam.  I also discussed these findings with Vernona Rieger in the office of Dr. Estanislado Pandy at the time of imaging.  BI-RADS CATEGORY 4:  Suspicious abnormality - biopsy should be considered.  Recommendation:  Right breast ultrasound guided core biopsy and right axillary lymph node biopsy  Original Report Authenticated By: Harrel Lemon, M.D.   Mm Radiologist Eval And Mgmt  07/09/2011  *RADIOLOGY REPORT*  ESTABLISHED PATIENT OFFICE VISIT - LEVEL II [phone])  Chief Complaint:  The patient returns for discussion of the pathology report after ultrasound-guided core needle biopsy of a mass in the 9 o'clock position of the right breast and of an abnormal right axillary lymph node.  History:  The patient felt a mass at 9 o'clock in the right breast. Imaging evaluation demonstrated a mass in this location as well as an abnormal right axillary lymph node.  Ultrasound-guided core needle biopsy of each laser was performed on 07/08/2011.  Exam:  On physical examination, there is moderate ecchymosis at the biopsy site at 9 o'clock.  There is mild ecchymosis at the axillary lymph node biopsy site.  There is no sign of hematoma or infection.  Assessment and Plan:  Histologic evaluation demonstrates invasive mammary carcinoma and in  situ carcinoma involving the mass at 9 o'clock.  The right axillary lymph node is positive for metastatic mammary carcinoma.  This is concordant with the imaging findings. Results were discussed with the patient and her husband.  Breast MRI was scheduled for 07/15/2011.  The patient was scheduled to be seen in the [facility] on 07/17/2011.  The patient's questions were answered.  Educational materials were given.  Original Report Authenticated By: Daryl Eastern, M.D.    Assessment and Plan:  41 yo with locally advanced er+ cancer . Staging PET wnl, 2d echo-nl EF and port has been placed. We discussed starting dd FEC on 3/27. I have called in scripts and discussed the schedule.She is anxious to proceed.    Pierce Crane M.D. FRCP C.  45 minutes was spent with this patient half the time and  patient-related counseling

## 2011-08-05 ENCOUNTER — Encounter: Payer: Self-pay | Admitting: *Deleted

## 2011-08-05 NOTE — Progress Notes (Signed)
Mailed after appt letter to pt. 

## 2011-08-06 ENCOUNTER — Telehealth: Payer: Self-pay | Admitting: *Deleted

## 2011-08-06 ENCOUNTER — Other Ambulatory Visit: Payer: Self-pay | Admitting: *Deleted

## 2011-08-06 NOTE — Telephone Encounter (Signed)
per patient confirmed over the phone 08-08-2011 at 8:30am

## 2011-08-07 ENCOUNTER — Ambulatory Visit (HOSPITAL_BASED_OUTPATIENT_CLINIC_OR_DEPARTMENT_OTHER): Payer: BC Managed Care – PPO

## 2011-08-07 VITALS — BP 120/76 | HR 73 | Temp 96.7°F

## 2011-08-07 DIAGNOSIS — Z5111 Encounter for antineoplastic chemotherapy: Secondary | ICD-10-CM

## 2011-08-07 DIAGNOSIS — C50419 Malignant neoplasm of upper-outer quadrant of unspecified female breast: Secondary | ICD-10-CM

## 2011-08-07 MED ORDER — SODIUM CHLORIDE 0.9 % IV SOLN
Freq: Once | INTRAVENOUS | Status: AC
  Administered 2011-08-07: 09:00:00 via INTRAVENOUS

## 2011-08-07 MED ORDER — SODIUM CHLORIDE 0.9 % IV SOLN
500.0000 mg/m2 | Freq: Once | INTRAVENOUS | Status: AC
  Administered 2011-08-07: 900 mg via INTRAVENOUS
  Filled 2011-08-07: qty 45

## 2011-08-07 MED ORDER — PALONOSETRON HCL INJECTION 0.25 MG/5ML
0.2500 mg | Freq: Once | INTRAVENOUS | Status: AC
  Administered 2011-08-07: 0.25 mg via INTRAVENOUS

## 2011-08-07 MED ORDER — HEPARIN SOD (PORK) LOCK FLUSH 100 UNIT/ML IV SOLN
500.0000 [IU] | Freq: Once | INTRAVENOUS | Status: AC | PRN
  Administered 2011-08-07: 500 [IU]
  Filled 2011-08-07: qty 5

## 2011-08-07 MED ORDER — EPIRUBICIN HCL CHEMO IV INJECTION 200 MG/100ML
100.0000 mg/m2 | Freq: Once | INTRAVENOUS | Status: AC
  Administered 2011-08-07: 182 mg via INTRAVENOUS
  Filled 2011-08-07: qty 91

## 2011-08-07 MED ORDER — SODIUM CHLORIDE 0.9 % IV SOLN
150.0000 mg | Freq: Once | INTRAVENOUS | Status: AC
  Administered 2011-08-07: 150 mg via INTRAVENOUS
  Filled 2011-08-07: qty 5

## 2011-08-07 MED ORDER — DEXAMETHASONE SODIUM PHOSPHATE 4 MG/ML IJ SOLN
12.0000 mg | Freq: Once | INTRAMUSCULAR | Status: AC
  Administered 2011-08-07: 12 mg via INTRAVENOUS

## 2011-08-07 MED ORDER — FLUOROURACIL CHEMO INJECTION 2.5 GM/50ML
500.0000 mg/m2 | Freq: Once | INTRAVENOUS | Status: AC
  Administered 2011-08-07: 900 mg via INTRAVENOUS
  Filled 2011-08-07: qty 18

## 2011-08-07 MED ORDER — SODIUM CHLORIDE 0.9 % IJ SOLN
10.0000 mL | INTRAMUSCULAR | Status: DC | PRN
  Administered 2011-08-07: 10 mL
  Filled 2011-08-07: qty 10

## 2011-08-07 NOTE — Patient Instructions (Signed)
[facility]   CHEMOTHERAPY INSTRUCTIONS   POTENTIAL SIDE EFFECTS OF TREATMENT:   MEDICATIONS: You have been given prescriptions for the following medications:    Other Medication Instructions: as precribed  SYMPTOMS TO REPORT AS SOON AS POSSIBLE AFTER TREATMENT:  FEVER GREATER THAN 100.5 F  CHILLS WITH OR WITHOUT FEVER  NAUSEA AND VOMITING THAT IS NOT CONTROLLED WITH YOUR NAUSEA MEDICATION  UNUSUAL SHORTNESS OF BREATH  UNUSUAL BRUISING OR BLEEDING  TENDERNESS IN MOUTH AND THROAT WITH OR WITHOUT PRESENCE OF ULCERS  URINARY PROBLEMS  BOWEL PROBLEMS  UNUSUAL RASH    Wear comfortable clothing and clothing appropriate for easy access to any Portacath or PICC line. Let us know if there is anything that we can do to make your therapy better!      I have been informed and understand all of the instructions given to me and have received a copy. I have been instructed to call the clinic [phone]  or my family physician as soon as possible for continued medical care, if indicated. I do not have any more questions at this time but understand that I may call the Cancer Center at [phone] during office hours should I have questions or need assistance in obtaining follow-up care.      _________________________________________      _______________     __________ Signature of Patient or Authorized Representative        Date                            Time      _________________________________________ Nurse's Signature

## 2011-08-08 ENCOUNTER — Telehealth: Payer: Self-pay | Admitting: *Deleted

## 2011-08-08 ENCOUNTER — Ambulatory Visit (HOSPITAL_BASED_OUTPATIENT_CLINIC_OR_DEPARTMENT_OTHER): Payer: BC Managed Care – PPO

## 2011-08-08 ENCOUNTER — Other Ambulatory Visit: Payer: Self-pay | Admitting: Oncology

## 2011-08-08 VITALS — BP 121/77 | HR 75 | Temp 97.9°F

## 2011-08-08 DIAGNOSIS — C50419 Malignant neoplasm of upper-outer quadrant of unspecified female breast: Secondary | ICD-10-CM

## 2011-08-08 MED ORDER — PEGFILGRASTIM INJECTION 6 MG/0.6ML
6.0000 mg | Freq: Once | SUBCUTANEOUS | Status: AC
  Administered 2011-08-08: 6 mg via SUBCUTANEOUS

## 2011-08-08 NOTE — Telephone Encounter (Signed)
Message copied by Augusto Garbe on Thu Aug 08, 2011 12:38 PM ------      Message from: Stanton, [place] P      Created: Wed Aug 07, 2011 12:15 PM      Regarding: chemo follow up call      Contact: [phone]       1st FEC, Dr. Donnie Coffin

## 2011-08-08 NOTE — Telephone Encounter (Signed)
Called patient requesting return call for chemotherapy f/u.  Encouraged to drink lots of fluids and hopefully she is eating well.

## 2011-08-12 ENCOUNTER — Other Ambulatory Visit: Payer: Self-pay | Admitting: Certified Registered Nurse Anesthetist

## 2011-08-12 ENCOUNTER — Encounter: Payer: Self-pay | Admitting: Specialist

## 2011-08-12 NOTE — Progress Notes (Signed)
I spoke with Kelly Mills by phone as a follow-up and to again discuss briefly the support services available at the [facility].  Her response was that she has started chemo and she is doing fine.  She expressed interest in Look Good, Feel Better.  She has talked with the coordinator of [facility].  I reminded her about the [facility] Support Group at [facility].

## 2011-08-13 ENCOUNTER — Other Ambulatory Visit: Payer: Self-pay | Admitting: Physician Assistant

## 2011-08-13 DIAGNOSIS — C50419 Malignant neoplasm of upper-outer quadrant of unspecified female breast: Secondary | ICD-10-CM

## 2011-08-14 ENCOUNTER — Encounter: Payer: Self-pay | Admitting: Physician Assistant

## 2011-08-14 ENCOUNTER — Other Ambulatory Visit: Payer: BC Managed Care – PPO

## 2011-08-14 ENCOUNTER — Encounter: Payer: Self-pay | Admitting: Oncology

## 2011-08-14 ENCOUNTER — Ambulatory Visit (HOSPITAL_BASED_OUTPATIENT_CLINIC_OR_DEPARTMENT_OTHER): Payer: BC Managed Care – PPO | Admitting: Physician Assistant

## 2011-08-14 ENCOUNTER — Telehealth: Payer: Self-pay | Admitting: Oncology

## 2011-08-14 VITALS — BP 116/80 | HR 89 | Temp 97.5°F | Ht 65.0 in | Wt 153.9 lb

## 2011-08-14 DIAGNOSIS — C50419 Malignant neoplasm of upper-outer quadrant of unspecified female breast: Secondary | ICD-10-CM

## 2011-08-14 DIAGNOSIS — D709 Neutropenia, unspecified: Secondary | ICD-10-CM

## 2011-08-14 LAB — CBC WITH DIFFERENTIAL/PLATELET
BASO%: 1.5 % (ref 0.0–2.0)
EOS%: 5.3 % (ref 0.0–7.0)
HCT: 35.7 % (ref 34.8–46.6)
HGB: 11.9 g/dL (ref 11.6–15.9)
MCH: 32.6 pg (ref 25.1–34.0)
MCHC: 33.3 g/dL (ref 31.5–36.0)
MONO#: 0.3 10*3/uL (ref 0.1–0.9)
RDW: 11.5 % (ref 11.2–14.5)
WBC: 2.6 10*3/uL — ABNORMAL LOW (ref 3.9–10.3)
lymph#: 1.3 10*3/uL (ref 0.9–3.3)

## 2011-08-14 NOTE — Progress Notes (Signed)
Patient stop by my office today,because she had some questions about her bill,and also she wanted to apply for financial assistance,so I gave her an epp application to fill out and return to [place].

## 2011-08-14 NOTE — Telephone Encounter (Signed)
gve the pt her April,may 2013 appt calendar

## 2011-08-16 MED ORDER — ZOLPIDEM TARTRATE 5 MG PO TABS: 5.0000 mg | ORAL_TABLET | Freq: Every evening | ORAL | Status: DC | PRN

## 2011-08-16 NOTE — Progress Notes (Signed)
Hematology and Oncology Follow Up Visit  Kelly Mills [id number] Jan 01, 1971 41 y.o. 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 She is currently day 7 cycle one of 4 planned neoadjuvant dose dense FEC with Neulasta support on day 2.  Interim History:   Kelly Mills returns today for followup after her first of 4 planned neoadjuvant dose dense FEC being given for an ER/PR positive, HER-2 negative locally advanced left breast carcinoma. She is feeling well. She denies any difficulty with fevers, chills, night sweats. She denies any nausea, emesis, diarrhea or constipation issues. She denies a diffuse bone pain. She denies heartburn. She feels that her overall energy level is actually pretty good. She does utilize Ambien to help her sleep, and requests a refill prescription if at all possible today. She denies any abnormal bleeding or bruising symptoms. A detailed review of systems is otherwise noncontributory as noted below.  Review of Systems: Constitutional:  no weight loss, fever, night sweats and feels well Eyes: No complaints ENT: No complaints Cardiovascular: no chest pain or dyspnea on exertion Respiratory: no cough, shortness of breath, or wheezing Neurological: no TIA or stroke symptoms Dermatological: negative Gastrointestinal: no abdominal pain, change in bowel habits, or black or bloody stools Genito-Urinary: no dysuria, trouble voiding, or hematuria Hematological and Lymphatic: negative Breast: negative Musculoskeletal: negative Remaining ROS negative.  Medications:   I have reviewed the patient's current medications.  Current Outpatient Prescriptions  Medication Sig Dispense Refill  . dexamethasone (DECADRON) 4 MG tablet Take 2 tablets by mouth once a day on the day after chemotherapy and then take 2 tablets two times a day for  2 days. Take with food.  30 tablet  1  . LORazepam (ATIVAN) 0.5 MG tablet Take 1 tablet (0.5 mg total) by mouth every 6 (six) hours as needed (Nausea or vomiting).  30 tablet  0  . Multiple Vitamin (MULTIVITAMIN) capsule Take 1 capsule by mouth daily.      . ondansetron (ZOFRAN) 8 MG tablet Take 1 tablet two times a day as needed for nausea or vomiting starting on the third day after chemotherapy.  30 tablet  1  . prochlorperazine (COMPAZINE) 10 MG tablet Take 1 tablet (10 mg total) by mouth every 6 (six) hours as needed (Nausea or vomiting).  30 tablet  1  . prochlorperazine (COMPAZINE) 25 MG suppository Place 1 suppository (25 mg total) rectally every 12 (twelve) hours as needed for nausea.  12 suppository  3  . TRAZODONE HCL PO Take by mouth as needed.      . Zolpidem Tartrate (AMBIEN PO) Take by mouth at bedtime as needed.        Allergies:  Allergies  Allergen Reactions  . Codeine Nausea Only    Physical Exam: Filed Vitals:   08/14/11 1349  BP: 116/80  Pulse: 89  Temp: 97.5 F (36.4 C)    Body mass index is 25.61 kg/(m^2). HEENT:  Sclerae anicteric, conjunctivae pink.  Oropharynx clear.  No mucositis or candidiasis.   Nodes:  No cervical, supraclavicular, or axillary lymphadenopathy palpated.  Breast Exam:  Deferred. Lungs:  Clear to auscultation bilaterally.  No crackles, rhonchi, or wheezes.   Heart:  Regular rate and rhythm.   Abdomen:  Soft, nontender.  Positive bowel sounds.  No organomegaly or masses palpated.   Musculoskeletal:  No focal spinal tenderness to palpation.  Extremities:  Benign.  No peripheral edema or cyanosis.   Skin:  Benign.   Neuro:  Nonfocal, alert and oriented x 3.   Lab Results: Lab Results  Component Value Date   WBC 2.6* 08/14/2011   HGB 11.9 08/14/2011   HCT 35.7 08/14/2011   MCV 97.9 08/14/2011   PLT 103* 08/14/2011   NEUTROABS 0.8* 08/14/2011     Chemistry      Component Value Date/Time   NA 138 07/17/2011 1244   K 3.9 07/17/2011 1244   CL 101  07/17/2011 1244   CO2 29 07/17/2011 1244   BUN 13 07/17/2011 1244   CREATININE 0.65 07/17/2011 1244      Component Value Date/Time   CALCIUM 9.4 07/17/2011 1244   ALKPHOS 44 07/17/2011 1244   AST 15 07/17/2011 1244   ALT 16 07/17/2011 1244   BILITOT 0.2* 07/17/2011 1244      Lab Results  Component Value Date   LABCA2 19 07/17/2011    Radiological Studies: Dg Chest Port 1 View 07/22/2011  *RADIOLOGY REPORT*  Clinical Data: Port-A-Cath placement  PORTABLE CHEST - 1 VIEW  Comparison: None.  Findings: 0850 hours.  Left-sided Port-A-Cath is noted with distal tip positioned at the distal SVC level.  No evidence for left-sided pneumothorax.  No edema or focal airspace consolidation. Cardiopericardial silhouette is at upper limits of normal for size. Imaged bony structures of the thorax are intact.  IMPRESSION: The tip of the left Port-A-Cath is positioned at the level of the distal SVC.  No evidence for pneumothorax.  Original Report Authenticated By: ERIC A. MANSELL, M.D.    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 She is currently day 7 cycle one of 4 planned neoadjuvant dose dense FEC with Neulasta support on day 2.  Case reviewed with Dr. Pierce Crane.   Plan:  Kelly Mills is doing well. I have refilled her prescription for Ambien at 5 mg per her request. I will see her in one week's time prior to day 1 cycle 2 of neoadjuvant dose dense FEC, physical exam will include a breast exam. She is noted to be mildly neutropenic, but I will not place her on antibiotics at this time. She will monitor her temperatures and contact us if fevers should occur.   This plan was reviewed with the patient, who voices understanding and agreement.  She knows to call with any changes or problems.    Hailey Miles T, PA-C 08/14/11

## 2011-08-20 ENCOUNTER — Other Ambulatory Visit: Payer: Self-pay | Admitting: Physician Assistant

## 2011-08-20 DIAGNOSIS — C50419 Malignant neoplasm of upper-outer quadrant of unspecified female breast: Secondary | ICD-10-CM

## 2011-08-21 ENCOUNTER — Other Ambulatory Visit (HOSPITAL_BASED_OUTPATIENT_CLINIC_OR_DEPARTMENT_OTHER): Payer: BC Managed Care – PPO | Admitting: Lab

## 2011-08-21 ENCOUNTER — Other Ambulatory Visit: Payer: Self-pay | Admitting: Oncology

## 2011-08-21 ENCOUNTER — Ambulatory Visit (HOSPITAL_BASED_OUTPATIENT_CLINIC_OR_DEPARTMENT_OTHER): Payer: BC Managed Care – PPO

## 2011-08-21 ENCOUNTER — Ambulatory Visit (HOSPITAL_BASED_OUTPATIENT_CLINIC_OR_DEPARTMENT_OTHER): Payer: BC Managed Care – PPO | Admitting: Physician Assistant

## 2011-08-21 VITALS — BP 119/82 | HR 67 | Temp 98.5°F | Ht 65.0 in | Wt 155.0 lb

## 2011-08-21 DIAGNOSIS — C50919 Malignant neoplasm of unspecified site of unspecified female breast: Secondary | ICD-10-CM

## 2011-08-21 DIAGNOSIS — C50419 Malignant neoplasm of upper-outer quadrant of unspecified female breast: Secondary | ICD-10-CM

## 2011-08-21 DIAGNOSIS — Z5111 Encounter for antineoplastic chemotherapy: Secondary | ICD-10-CM

## 2011-08-21 DIAGNOSIS — C50911 Malignant neoplasm of unspecified site of right female breast: Secondary | ICD-10-CM

## 2011-08-21 LAB — CBC WITH DIFFERENTIAL/PLATELET
BASO%: 0.2 % (ref 0.0–2.0)
EOS%: 0.4 % (ref 0.0–7.0)
MCH: 32.8 pg (ref 25.1–34.0)
MCHC: 33.9 g/dL (ref 31.5–36.0)
NEUT%: 64.2 % (ref 38.4–76.8)
RDW: 11.6 % (ref 11.2–14.5)
lymph#: 1.9 10*3/uL (ref 0.9–3.3)

## 2011-08-21 MED ORDER — EPIRUBICIN HCL CHEMO IV INJECTION 200 MG/100ML
100.0000 mg/m2 | Freq: Once | INTRAVENOUS | Status: AC
  Administered 2011-08-21: 182 mg via INTRAVENOUS
  Filled 2011-08-21: qty 91

## 2011-08-21 MED ORDER — SODIUM CHLORIDE 0.9 % IJ SOLN
10.0000 mL | INTRAMUSCULAR | Status: DC | PRN
  Administered 2011-08-21: 10 mL
  Filled 2011-08-21: qty 10

## 2011-08-21 MED ORDER — DEXAMETHASONE SODIUM PHOSPHATE 4 MG/ML IJ SOLN
12.0000 mg | Freq: Once | INTRAMUSCULAR | Status: AC
  Administered 2011-08-21: 12 mg via INTRAVENOUS

## 2011-08-21 MED ORDER — FLUOROURACIL CHEMO INJECTION 2.5 GM/50ML
500.0000 mg/m2 | Freq: Once | INTRAVENOUS | Status: AC
  Administered 2011-08-21: 900 mg via INTRAVENOUS
  Filled 2011-08-21: qty 18

## 2011-08-21 MED ORDER — PALONOSETRON HCL INJECTION 0.25 MG/5ML
0.2500 mg | Freq: Once | INTRAVENOUS | Status: AC
  Administered 2011-08-21: 0.25 mg via INTRAVENOUS

## 2011-08-21 MED ORDER — HEPARIN SOD (PORK) LOCK FLUSH 100 UNIT/ML IV SOLN
500.0000 [IU] | Freq: Once | INTRAVENOUS | Status: AC | PRN
  Administered 2011-08-21: 500 [IU]
  Filled 2011-08-21: qty 5

## 2011-08-21 MED ORDER — SODIUM CHLORIDE 0.9 % IV SOLN
150.0000 mg | Freq: Once | INTRAVENOUS | Status: AC
  Administered 2011-08-21: 150 mg via INTRAVENOUS
  Filled 2011-08-21: qty 5

## 2011-08-21 MED ORDER — SODIUM CHLORIDE 0.9 % IV SOLN
500.0000 mg/m2 | Freq: Once | INTRAVENOUS | Status: AC
  Administered 2011-08-21: 900 mg via INTRAVENOUS
  Filled 2011-08-21: qty 45

## 2011-08-21 NOTE — Patient Instructions (Signed)
[facility] Discharge Instructions for Patients Receiving Chemotherapy  Today you received the following chemotherapy agents Ellence, 5FU, cytoxan  To help prevent nausea and vomiting after your treatment, we encourage you to take your nausea medication as prescribed by MD   If you develop nausea and vomiting that is not controlled by your nausea medication, call the clinic. If it is after clinic hours your family physician or the after hours number for the clinic or go to the Emergency Department.   BELOW ARE SYMPTOMS THAT SHOULD BE REPORTED IMMEDIATELY:  *FEVER GREATER THAN 100.5 F  *CHILLS WITH OR WITHOUT FEVER  NAUSEA AND VOMITING THAT IS NOT CONTROLLED WITH YOUR NAUSEA MEDICATION  *UNUSUAL SHORTNESS OF BREATH  *UNUSUAL BRUISING OR BLEEDING  TENDERNESS IN MOUTH AND THROAT WITH OR WITHOUT PRESENCE OF ULCERS  *URINARY PROBLEMS  *BOWEL PROBLEMS  UNUSUAL RASH Items with * indicate a potential emergency and should be followed up as soon as possible.  One of the nurses will contact you 24 hours after your treatment. Please let the nurse know about any problems that you may have experienced. Feel free to call the clinic you have any questions or concerns. The clinic phone number is [phone].   I have been informed and understand all the instructions given to me. I know to contact the clinic, my physician, or go to the Emergency Department if any problems should occur. I do not have any questions at this time, but understand that I may call the clinic during office hours   should I have any questions or need assistance in obtaining follow up care.    __________________________________________  _____________  __________ Signature of Patient or Authorized Representative            Date                   Time    __________________________________________ Nurse's Signature

## 2011-08-22 ENCOUNTER — Ambulatory Visit (HOSPITAL_BASED_OUTPATIENT_CLINIC_OR_DEPARTMENT_OTHER): Payer: BC Managed Care – PPO

## 2011-08-22 VITALS — BP 115/75 | HR 72 | Temp 97.3°F

## 2011-08-22 DIAGNOSIS — Z5189 Encounter for other specified aftercare: Secondary | ICD-10-CM

## 2011-08-22 DIAGNOSIS — C50419 Malignant neoplasm of upper-outer quadrant of unspecified female breast: Secondary | ICD-10-CM

## 2011-08-22 LAB — COMPREHENSIVE METABOLIC PANEL
AST: 14 U/L (ref 0–37)
Albumin: 3.8 g/dL (ref 3.5–5.2)
Alkaline Phosphatase: 64 U/L (ref 39–117)
BUN: 13 mg/dL (ref 6–23)
Potassium: 4.3 mEq/L (ref 3.5–5.3)
Sodium: 135 mEq/L (ref 135–145)

## 2011-08-22 MED ORDER — PEGFILGRASTIM INJECTION 6 MG/0.6ML
6.0000 mg | Freq: Once | SUBCUTANEOUS | Status: AC
  Administered 2011-08-22: 6 mg via SUBCUTANEOUS
  Filled 2011-08-22: qty 0.6

## 2011-08-22 NOTE — Patient Instructions (Signed)
Pt in for neulasta.  Pt says she has the feeling in her eyes that she gets when they are dilated for an exam.  She explains that it feels like extra light is coming into her eyes.  RN educated pt on the side effects of steroids that this can lead to photosensitivity.  Pt to call if it worsens and with any other questions and concerns.  Otherwise pt states she is fine, injection given and pt discharged home ambulatory.

## 2011-08-22 NOTE — Progress Notes (Signed)
Hematology and Oncology Follow Up Visit  Reilyn Nelson [id number] 1970-09-03 41 y.o. 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 She is currently day 1 cycle 2/4 planned neoadjuvant dose dense FEC with Neulasta support on day 2.  Interim History:   Darria returns today for followup prior to day 1 cycle 2 of 4 planned neoadjuvant dose dense FEC. She is feeling well today, she denies fevers, chills, night sweats. No nausea, emesis, diarrhea, or constipation issues. She feels that the mass in the lateral aspect of her right breast has decreased in size. Energy level is quite good. Overall she feels well.  A detailed review of systems is otherwise noncontributory as noted below.  Review of Systems: Constitutional:  no weight loss, fever, night sweats and feels well Eyes: No complaints ENT: No complaints Cardiovascular: no chest pain or dyspnea on exertion Respiratory: no cough, shortness of breath, or wheezing Neurological: no TIA or stroke symptoms Dermatological: negative Gastrointestinal: no abdominal pain, change in bowel habits, or black or bloody stools Genito-Urinary: no dysuria, trouble voiding, or hematuria Hematological and Lymphatic: negative Breast: negative Musculoskeletal: negative Remaining ROS negative.  Medications:   I have reviewed the patient's current medications.  Current Outpatient Prescriptions  Medication Sig Dispense Refill  . dexamethasone (DECADRON) 4 MG tablet Take 2 tablets by mouth once a day on the day after chemotherapy and then take 2 tablets two times a day for 2 days. Take with food.  30 tablet  1  . Multiple Vitamin (MULTIVITAMIN) capsule Take 1 capsule by mouth daily.      . ondansetron (ZOFRAN) 8 MG tablet Take 1 tablet two times a day as needed for nausea or vomiting starting on the third day  after chemotherapy.  30 tablet  1  . prochlorperazine (COMPAZINE) 10 MG tablet Take 1 tablet (10 mg total) by mouth every 6 (six) hours as needed (Nausea or vomiting).  30 tablet  1  . TRAZODONE HCL PO Take by mouth as needed.      . zolpidem (AMBIEN) 5 MG tablet Take 1 tablet (5 mg total) by mouth at bedtime as needed.  30 tablet  0  . LORazepam (ATIVAN) 0.5 MG tablet Take 1 tablet (0.5 mg total) by mouth every 6 (six) hours as needed (Nausea or vomiting).  30 tablet  0  . prochlorperazine (COMPAZINE) 25 MG suppository Place 1 suppository (25 mg total) rectally every 12 (twelve) hours as needed for nausea.  12 suppository  3   No current facility-administered medications for this visit.   Facility-Administered Medications Ordered in Other Visits  Medication Dose Route Frequency Provider Last Rate Last Dose  . cyclophosphamide (CYTOXAN) 900 mg in sodium chloride 0.9 % 250 mL chemo infusion  500 mg/m2 (Treatment Plan Actual) Intravenous Once Amada Kingfisher, PA   900 mg at 08/21/11 1523  . dexamethasone (DECADRON) injection 12 mg  12 mg Intravenous Once Amada Kingfisher, PA   12 mg at 08/21/11 1431  . epirubicin (ELLENCE) chemo injection 182 mg  100 mg/m2 (Treatment Plan Actual) Intravenous Once Amada Kingfisher, PA   182 mg at 08/21/11 1511  . fluorouracil (ADRUCIL) chemo injection 900 mg  500 mg/m2 (Treatment Plan Actual) Intravenous Once Amada Kingfisher, PA   900 mg at 08/21/11 1523  . fosaprepitant (EMEND) 150 mg in sodium chloride 0.9 % 145  mL IVPB  150 mg Intravenous Once Amada Kingfisher, PA   150 mg at 08/21/11 1431  . heparin lock flush 100 unit/mL  500 Units Intracatheter Once PRN Amada Kingfisher, PA   500 Units at 08/21/11 1604  . palonosetron (ALOXI) injection 0.25 mg  0.25 mg Intravenous Once Amada Kingfisher, PA   0.25 mg at 08/21/11 1430  . DISCONTD: sodium chloride 0.9 % injection 10 mL  10 mL Intracatheter PRN Amada Kingfisher, PA   10 mL at 08/21/11 1604      Allergies:  Allergies  Allergen Reactions  . Codeine Nausea Only    Physical Exam: Filed Vitals:   08/21/11 1250  BP: 119/82  Pulse: 67  Temp: 98.5 F (36.9 C)    Body mass index is 25.79 kg/(m^2). Weight: 155 lbs. HEENT:  Sclerae anicteric, conjunctivae pink.  Oropharynx clear.  No mucositis or candidiasis.   Nodes:  No cervical, supraclavicular, or axillary lymphadenopathy palpated.  Breast Exam:  The mass in the lateral aspect of the right breast almost to the upper outer quadrant is about the size of a walnut. It is deep, no palpable lymphadenopathy is noted. The remaining breast tissue is clear free of masses no nipple inversion or discharge. Lungs:  Clear to auscultation bilaterally.  No crackles, rhonchi, or wheezes.   Heart:  Regular rate and rhythm.   Abdomen:  Soft, nontender.  Positive bowel sounds.  No organomegaly or masses palpated.   Musculoskeletal:  No focal spinal tenderness to palpation.  Extremities:  Benign.  No peripheral edema or cyanosis.   Skin:  Benign.   Neuro:  Nonfocal, alert and oriented x 3.   Lab Results: Lab Results  Component Value Date   WBC 7.4 08/21/2011   HGB 11.1* 08/21/2011   HCT 32.8* 08/21/2011   MCV 97.0 08/21/2011   PLT 165 08/21/2011   NEUTROABS 4.7 08/21/2011     Chemistry      Component Value Date/Time   NA 135 08/21/2011 1234   K 4.3 08/21/2011 1234   CL 101 08/21/2011 1234   CO2 26 08/21/2011 1234   BUN 13 08/21/2011 1234   CREATININE 0.64 08/21/2011 1234      Component Value Date/Time   CALCIUM 9.4 08/21/2011 1234   ALKPHOS 64 08/21/2011 1234   AST 14 08/21/2011 1234   ALT 14 08/21/2011 1234   BILITOT 0.2* 08/21/2011 1234      Lab Results  Component Value Date   LABCA2 19 07/17/2011    Radiological Studies: Dg Chest Port 1 View 07/22/2011  *RADIOLOGY REPORT*  Clinical Data: Port-A-Cath placement  PORTABLE CHEST - 1 VIEW  Comparison: None.  Findings: 0850 hours.  Left-sided Port-A-Cath is noted with distal tip  positioned at the distal SVC level.  No evidence for left-sided pneumothorax.  No edema or focal airspace consolidation. Cardiopericardial silhouette is at upper limits of normal for size. Imaged bony structures of the thorax are intact.  IMPRESSION: The tip of the left Port-A-Cath is positioned at the level of the distal SVC.  No evidence for pneumothorax.  Original Report Authenticated By: ERIC A. MANSELL, M.D.    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 She is currently day 1 cycle 2/4 planned neoadjuvant dose dense FEC with Neulasta support on day 2.  Case reviewed with Dr. Pierce Crane.   Plan:  Caiden is doing well, and will proceed to day 1 cycle 2 neoadjuvant dose dense FEC. She'll return tomorrow for Neulasta providing granulocytes support. I will see her in one week's time for nadir assessment, she knows to contact us sooner if the need should arise.  This plan was reviewed with the patient, who voices understanding and agreement.  She knows to call with any changes or problems.    Oria Klimas T, PA-C 08/21/11

## 2011-08-26 ENCOUNTER — Other Ambulatory Visit: Payer: Self-pay | Admitting: Certified Registered Nurse Anesthetist

## 2011-08-28 ENCOUNTER — Ambulatory Visit (HOSPITAL_BASED_OUTPATIENT_CLINIC_OR_DEPARTMENT_OTHER): Payer: BC Managed Care – PPO | Admitting: Physician Assistant

## 2011-08-28 ENCOUNTER — Ambulatory Visit: Payer: BC Managed Care – PPO | Admitting: Physician Assistant

## 2011-08-28 ENCOUNTER — Other Ambulatory Visit (HOSPITAL_BASED_OUTPATIENT_CLINIC_OR_DEPARTMENT_OTHER): Payer: BC Managed Care – PPO | Admitting: Lab

## 2011-08-28 ENCOUNTER — Ambulatory Visit: Payer: BC Managed Care – PPO | Admitting: Lab

## 2011-08-28 VITALS — BP 131/85 | HR 75 | Temp 98.1°F | Ht 65.0 in | Wt 153.8 lb

## 2011-08-28 DIAGNOSIS — C50419 Malignant neoplasm of upper-outer quadrant of unspecified female breast: Secondary | ICD-10-CM

## 2011-08-28 DIAGNOSIS — Z170 Estrogen receptor positive status [ER+]: Secondary | ICD-10-CM

## 2011-08-28 DIAGNOSIS — M549 Dorsalgia, unspecified: Secondary | ICD-10-CM

## 2011-08-28 LAB — CBC WITH DIFFERENTIAL/PLATELET
BASO%: 0.6 % (ref 0.0–2.0)
EOS%: 0.2 % (ref 0.0–7.0)
HCT: 32.7 % — ABNORMAL LOW (ref 34.8–46.6)
LYMPH%: 23.2 % (ref 14.0–49.7)
MCH: 32.7 pg (ref 25.1–34.0)
MCHC: 33.8 g/dL (ref 31.5–36.0)
MONO#: 0.3 10*3/uL (ref 0.1–0.9)
NEUT%: 67.5 % (ref 38.4–76.8)
Platelets: 121 10*3/uL — ABNORMAL LOW (ref 145–400)
RBC: 3.37 10*6/uL — ABNORMAL LOW (ref 3.70–5.45)
WBC: 4.1 10*3/uL (ref 3.9–10.3)
lymph#: 1 10*3/uL (ref 0.9–3.3)
nRBC: 0 % (ref 0–0)

## 2011-08-28 MED ORDER — OXYCODONE HCL 5 MG PO TABS: 5.0000 mg | ORAL_TABLET | ORAL | Status: AC | PRN

## 2011-08-28 NOTE — Progress Notes (Signed)
Hematology and Oncology Follow Up Visit  Kelly Mills [id number] 24-Sep-1970 41 y.o. 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 She is currently day 7 cycle 2/4 planned neoadjuvant dose dense FEC with Neulasta support on day 2.  Interim History:   Kelly Mills returns today for followup after her second of 4 planned neoadjuvant dose dense FEC being given for an ER/PR positive, HER-2 negative locally advanced left breast carcinoma. She is feeling well. She denies any difficulty with fevers, chills, night sweats. She denies any nausea, emesis, diarrhea or constipation issues. She denies a diffuse bone pain, effect a region just right of the spine and the upper right back area where she has a "knot". This is not a new symptom, massage helps it.  She denies heartburn. She feels that her overall energy level is actually pretty good. She does utilize Ambien to help her sleep, and requests a refill prescription if at all possible today. She denies any abnormal bleeding or bruising symptoms. A detailed review of systems is otherwise noncontributory as noted below.  Review of Systems: Constitutional:  no weight loss, fever, night sweats and feels well Eyes: No complaints ENT: No complaints Cardiovascular: no chest pain or dyspnea on exertion Respiratory: no cough, shortness of breath, or wheezing Neurological: no TIA or stroke symptoms Dermatological: negative Gastrointestinal: no abdominal pain, change in bowel habits, or black or bloody stools Genito-Urinary: no dysuria, trouble voiding, or hematuria Hematological and Lymphatic: negative Breast: negative Musculoskeletal: Occasional muscle pain just lateral to the spine in the upper right back region, known area for "knot". Remaining ROS negative.  Medications:   I have reviewed the patient's current  medications.  Current Outpatient Prescriptions  Medication Sig Dispense Refill  . dexamethasone (DECADRON) 4 MG tablet Take 2 tablets by mouth once a day on the day after chemotherapy and then take 2 tablets two times a day for 2 days. Take with food.  30 tablet  1  . LORazepam (ATIVAN) 0.5 MG tablet Take 1 tablet (0.5 mg total) by mouth every 6 (six) hours as needed (Nausea or vomiting).  30 tablet  0  . Multiple Vitamin (MULTIVITAMIN) capsule Take 1 capsule by mouth daily.      . ondansetron (ZOFRAN) 8 MG tablet Take 1 tablet two times a day as needed for nausea or vomiting starting on the third day after chemotherapy.  30 tablet  1  . oxyCODONE (OXY IR/ROXICODONE) 5 MG immediate release tablet Take 1 tablet (5 mg total) by mouth every 4 (four) hours as needed for pain (1-2 po q3-4h prn pain).  90 tablet  0  . prochlorperazine (COMPAZINE) 10 MG tablet Take 1 tablet (10 mg total) by mouth every 6 (six) hours as needed (Nausea or vomiting).  30 tablet  1  . prochlorperazine (COMPAZINE) 25 MG suppository Place 1 suppository (25 mg total) rectally every 12 (twelve) hours as needed for nausea.  12 suppository  3  . TRAZODONE HCL PO Take by mouth as needed.      . zolpidem (AMBIEN) 5 MG tablet Take 1 tablet (5 mg total) by mouth at bedtime as needed.  30 tablet  0    Allergies:  Allergies  Allergen Reactions  . Codeine Nausea Only    Physical Exam: Filed Vitals:   08/28/11 1044  BP: 131/85  Pulse: 75  Temp: 98.1 F (  36.7 C)    Body mass index is 25.59 kg/(m^2). Weight: 153 lbs. HEENT:  Sclerae anicteric, conjunctivae pink.  Oropharynx clear.  No mucositis or candidiasis.   Nodes:  No cervical, supraclavicular, or axillary lymphadenopathy palpated.  Breast Exam:  Deferred. Lungs:  Clear to auscultation bilaterally.  No crackles, rhonchi, or wheezes.   Heart:  Regular rate and rhythm.   Abdomen:  Soft, nontender.  Positive bowel sounds.  No organomegaly or masses palpated.     Musculoskeletal:  No focal spinal tenderness to palpation.  Extremities:  Benign.  No peripheral edema or cyanosis.   Skin:  Benign.   Neuro:  Nonfocal, alert and oriented x 3.   Lab Results: CBC pending    Chemistry      Component Value Date/Time   NA 135 08/21/2011 1234   K 4.3 08/21/2011 1234   CL 101 08/21/2011 1234   CO2 26 08/21/2011 1234   BUN 13 08/21/2011 1234   CREATININE 0.64 08/21/2011 1234      Component Value Date/Time   CALCIUM 9.4 08/21/2011 1234   ALKPHOS 64 08/21/2011 1234   AST 14 08/21/2011 1234   ALT 14 08/21/2011 1234   BILITOT 0.2* 08/21/2011 1234      Lab Results  Component Value Date   LABCA2 19 07/17/2011    Radiological Studies: Dg Chest Port 1 View 07/22/2011  *RADIOLOGY REPORT*  Clinical Data: Port-A-Cath placement  PORTABLE CHEST - 1 VIEW  Comparison: None.  Findings: 0850 hours.  Left-sided Port-A-Cath is noted with distal tip positioned at the distal SVC level.  No evidence for left-sided pneumothorax.  No edema or focal airspace consolidation. Cardiopericardial silhouette is at upper limits of normal for size. Imaged bony structures of the thorax are intact.  IMPRESSION: The tip of the left Port-A-Cath is positioned at the level of the distal SVC.  No evidence for pneumothorax.  Original Report Authenticated By: ERIC A. MANSELL, M.D.    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 She is currently day 7 cycle 2/4 planned neoadjuvant dose dense FEC with Neulasta support on day 2.  Case reviewed with Dr. Pierce Crane.   Plan:  Kelly Mills is doing well. I will send her back to the lab so that a CBC can be obtained for nadir assessment. Antibiotic coverage will be provided if appropriate. Pertaining to the area in her back region, she does have a "trigger point". Massage is fine, but I have also provided a  prescription for oxycodone 5 mg tablets which she can utilize 2.5-5 mg if needed. She does use extremely sparingly. I will see her in one week's time for followup prior to her third cycle of 4 planned neoadjuvant dose dense FEC.  This plan was reviewed with the patient, who voices understanding and agreement.  She knows to call with any changes or problems.    Kennetta Pavlovic T, PA-C 08/14/11

## 2011-09-04 ENCOUNTER — Other Ambulatory Visit: Payer: Self-pay | Admitting: Emergency Medicine

## 2011-09-04 ENCOUNTER — Ambulatory Visit (HOSPITAL_BASED_OUTPATIENT_CLINIC_OR_DEPARTMENT_OTHER): Payer: BC Managed Care – PPO

## 2011-09-04 ENCOUNTER — Encounter: Payer: Self-pay | Admitting: Physician Assistant

## 2011-09-04 ENCOUNTER — Other Ambulatory Visit: Payer: BC Managed Care – PPO | Admitting: Lab

## 2011-09-04 ENCOUNTER — Ambulatory Visit: Payer: BC Managed Care – PPO | Admitting: Physician Assistant

## 2011-09-04 VITALS — BP 123/84 | HR 71 | Temp 97.8°F | Ht 65.0 in | Wt 155.5 lb

## 2011-09-04 DIAGNOSIS — Z5111 Encounter for antineoplastic chemotherapy: Secondary | ICD-10-CM

## 2011-09-04 DIAGNOSIS — C50911 Malignant neoplasm of unspecified site of right female breast: Secondary | ICD-10-CM

## 2011-09-04 DIAGNOSIS — C50419 Malignant neoplasm of upper-outer quadrant of unspecified female breast: Secondary | ICD-10-CM

## 2011-09-04 LAB — COMPREHENSIVE METABOLIC PANEL
ALT: 20 U/L (ref 0–35)
Alkaline Phosphatase: 70 U/L (ref 39–117)
Creatinine, Ser: 0.68 mg/dL (ref 0.50–1.10)
Glucose, Bld: 98 mg/dL (ref 70–99)
Sodium: 137 mEq/L (ref 135–145)
Total Bilirubin: 0.2 mg/dL — ABNORMAL LOW (ref 0.3–1.2)
Total Protein: 6.3 g/dL (ref 6.0–8.3)

## 2011-09-04 LAB — CBC WITH DIFFERENTIAL/PLATELET
EOS%: 0.2 % (ref 0.0–7.0)
LYMPH%: 19.6 % (ref 14.0–49.7)
MCH: 32.9 pg (ref 25.1–34.0)
MCHC: 34.1 g/dL (ref 31.5–36.0)
MCV: 96.5 fL (ref 79.5–101.0)
MONO%: 9.2 % (ref 0.0–14.0)
Platelets: 135 10*3/uL — ABNORMAL LOW (ref 145–400)
RBC: 3.25 10*6/uL — ABNORMAL LOW (ref 3.70–5.45)
RDW: 12.3 % (ref 11.2–14.5)
nRBC: 0 % (ref 0–0)

## 2011-09-04 LAB — LACTATE DEHYDROGENASE: LDH: 261 U/L — ABNORMAL HIGH (ref 94–250)

## 2011-09-04 MED ORDER — SODIUM CHLORIDE 0.9 % IV SOLN
150.0000 mg | Freq: Once | INTRAVENOUS | Status: AC
  Administered 2011-09-04: 150 mg via INTRAVENOUS
  Filled 2011-09-04: qty 5

## 2011-09-04 MED ORDER — PALONOSETRON HCL INJECTION 0.25 MG/5ML
0.2500 mg | Freq: Once | INTRAVENOUS | Status: AC
  Administered 2011-09-04: 0.25 mg via INTRAVENOUS

## 2011-09-04 MED ORDER — SODIUM CHLORIDE 0.9 % IJ SOLN
10.0000 mL | INTRAMUSCULAR | Status: DC | PRN
  Administered 2011-09-04: 10 mL
  Filled 2011-09-04: qty 10

## 2011-09-04 MED ORDER — HEPARIN SOD (PORK) LOCK FLUSH 100 UNIT/ML IV SOLN
500.0000 [IU] | Freq: Once | INTRAVENOUS | Status: AC | PRN
  Administered 2011-09-04: 500 [IU]
  Filled 2011-09-04: qty 5

## 2011-09-04 MED ORDER — EPIRUBICIN HCL CHEMO IV INJECTION 200 MG/100ML
100.0000 mg/m2 | Freq: Once | INTRAVENOUS | Status: AC
  Administered 2011-09-04: 182 mg via INTRAVENOUS
  Filled 2011-09-04: qty 91

## 2011-09-04 MED ORDER — FLUOROURACIL CHEMO INJECTION 2.5 GM/50ML
500.0000 mg/m2 | Freq: Once | INTRAVENOUS | Status: AC
  Administered 2011-09-04: 900 mg via INTRAVENOUS
  Filled 2011-09-04: qty 18

## 2011-09-04 MED ORDER — CYCLOPHOSPHAMIDE CHEMO INJECTION 1 GM
500.0000 mg/m2 | Freq: Once | INTRAMUSCULAR | Status: AC
  Administered 2011-09-04: 900 mg via INTRAVENOUS
  Filled 2011-09-04: qty 45

## 2011-09-04 MED ORDER — DEXAMETHASONE SODIUM PHOSPHATE 4 MG/ML IJ SOLN
12.0000 mg | Freq: Once | INTRAMUSCULAR | Status: AC
  Administered 2011-09-04: 12 mg via INTRAVENOUS

## 2011-09-04 MED ORDER — SODIUM CHLORIDE 0.9 % IV SOLN
Freq: Once | INTRAVENOUS | Status: AC
  Administered 2011-09-04: 13:00:00 via INTRAVENOUS

## 2011-09-04 NOTE — Progress Notes (Signed)
Hematology and Oncology Follow Up Visit  Altair Stanko [id number] 05/04/1971 41 y.o. 08/14/11    HPI: Armine is a 41 year old [place] woman with a newly diagnosed ER/PR positive at 98/80% respectively, 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 She is currently day 1 cycle 3/4 planned neoadjuvant dose dense FEC with Neulasta support on day 2.  Interim History:   Pema returns today for followup prior to day 1 cycle 3 of 4 planned neoadjuvant dose dense FEC. She is feeling well today, she denies fevers, chills, night sweats. No nausea, emesis, diarrhea, or constipation issues. She feels that the mass in the lateral aspect of her right breast has decreased in size. Energy level is quite good. Overall she feels well.  A detailed review of systems is otherwise noncontributory as noted below.  Review of Systems: Constitutional:  no weight loss, fever, night sweats and feels well Eyes: No complaints ENT: No complaints Cardiovascular: no chest pain or dyspnea on exertion Respiratory: no cough, shortness of breath, or wheezing Neurological: no TIA or stroke symptoms Dermatological: negative Gastrointestinal: no abdominal pain, change in bowel habits, or black or bloody stools Genito-Urinary: no dysuria, trouble voiding, or hematuria Hematological and Lymphatic: negative Breast: negative Musculoskeletal: negative Remaining ROS negative.  Medications:   I have reviewed the patient's current medications.  Current Outpatient Prescriptions  Medication Sig Dispense Refill  . dexamethasone (DECADRON) 4 MG tablet Take 2 tablets by mouth once a day on the day after chemotherapy and then take 2 tablets two times a day for 2 days. Take with food.  30 tablet  1  . LORazepam (ATIVAN) 0.5 MG tablet Take 1 tablet (0.5 mg total) by mouth every 6 (six) hours as needed (Nausea or vomiting).  30 tablet  0  . Multiple Vitamin (MULTIVITAMIN) capsule Take 1 capsule by mouth  daily.      . ondansetron (ZOFRAN) 8 MG tablet Take 1 tablet two times a day as needed for nausea or vomiting starting on the third day after chemotherapy.  30 tablet  1  . prochlorperazine (COMPAZINE) 10 MG tablet Take 1 tablet (10 mg total) by mouth every 6 (six) hours as needed (Nausea or vomiting).  30 tablet  1  . prochlorperazine (COMPAZINE) 25 MG suppository Place 1 suppository (25 mg total) rectally every 12 (twelve) hours as needed for nausea.  12 suppository  3  . TRAZODONE HCL PO Take by mouth as needed.      . zolpidem (AMBIEN) 5 MG tablet Take 1 tablet (5 mg total) by mouth at bedtime as needed.  30 tablet  0  . oxyCODONE (OXY IR/ROXICODONE) 5 MG immediate release tablet Take 1 tablet (5 mg total) by mouth every 4 (four) hours as needed for pain (1-2 po q3-4h prn pain).  90 tablet  0   No current facility-administered medications for this visit.   Facility-Administered Medications Ordered in Other Visits  Medication Dose Route Frequency Provider Last Rate Last Dose  . 0.9 %  sodium chloride infusion   Intravenous Once Pierce Crane, MD      . cyclophosphamide (CYTOXAN) 900 mg in sodium chloride 0.9 % 250 mL chemo infusion  500 mg/m2 (Treatment Plan Actual) Intravenous Once Pierce Crane, MD      . dexamethasone (DECADRON) injection 12 mg  12 mg Intravenous Once Pierce Crane, MD      . epirubicin [facility]) chemo injection 182 mg  100 mg/m2 (Treatment Plan Actual) Intravenous Once Pierce Crane, MD      .  fluorouracil (ADRUCIL) chemo injection 900 mg  500 mg/m2 (Treatment Plan Actual) Intravenous Once Pierce Crane, MD      . fosaprepitant (EMEND) 150 mg in sodium chloride 0.9 % 145 mL IVPB  150 mg Intravenous Once Pierce Crane, MD      . heparin lock flush 100 unit/mL  500 Units Intracatheter Once PRN Pierce Crane, MD      . palonosetron (ALOXI) injection 0.25 mg  0.25 mg Intravenous Once Pierce Crane, MD      . sodium chloride 0.9 % injection 10 mL  10 mL Intracatheter PRN Pierce Crane, MD         Allergies:  Allergies  Allergen Reactions  . Codeine Nausea Only    Physical Exam: Filed Vitals:   09/04/11 1151  BP: 123/84  Pulse: 71  Temp: 97.8 F (36.6 C)    Body mass index is 25.88 kg/(m^2). Weight: 155 lbs. HEENT:  Sclerae anicteric, conjunctivae pink.  Oropharynx clear.  No mucositis or candidiasis.   Nodes:  No cervical, supraclavicular, or axillary lymphadenopathy palpated.  Breast Exam:  The mass in the lateral aspect of the right breast almost to the upper outer quadrant is much softer. It is deep, no palpable lymphadenopathy is noted. The remaining breast tissue is clear free of masses no nipple inversion or discharge. Lungs:  Clear to auscultation bilaterally.  No crackles, rhonchi, or wheezes.   Heart:  Regular rate and rhythm.   Abdomen:  Soft, nontender.  Positive bowel sounds.  No organomegaly or masses palpated.   Musculoskeletal:  No focal spinal tenderness to palpation.  Extremities:  Benign.  No peripheral edema or cyanosis.   Skin:  Benign.   Neuro:  Nonfocal, alert and oriented x 3.   Lab Results: Lab Results  Component Value Date   WBC 10.1 09/04/2011   HGB 10.7* 09/04/2011   HCT 31.3* 09/04/2011   MCV 96.5 09/04/2011   PLT 135* 09/04/2011   NEUTROABS 7.1* 09/04/2011     Chemistry      Component Value Date/Time   NA 135 08/21/2011 1234   K 4.3 08/21/2011 1234   CL 101 08/21/2011 1234   CO2 26 08/21/2011 1234   BUN 13 08/21/2011 1234   CREATININE 0.64 08/21/2011 1234      Component Value Date/Time   CALCIUM 9.4 08/21/2011 1234   ALKPHOS 64 08/21/2011 1234   AST 14 08/21/2011 1234   ALT 14 08/21/2011 1234   BILITOT 0.2* 08/21/2011 1234      Lab Results  Component Value Date   LABCA2 19 07/17/2011     Assessment:  Jenaveve is a 41 year old [place] woman with a newly diagnosed ER/PR positive at 98/80% respectively, 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 She is currently day 1 cycle 3/4 planned neoadjuvant  dose dense FEC with Neulasta support on day 2.  Case reviewed with Dr. Pierce Crane.   Plan:  Lexa is doing well, and will proceed to day 1 cycle 3 neoadjuvant dose dense FEC. She'll return tomorrow for Neulasta providing granulocytes support. I will see her in one week's time for nadir assessment, she knows to contact us sooner if the need should arise.  This plan was reviewed with the patient, who voices understanding and agreement.  She knows to call with any changes or problems.    Huntington Leverich T, PA-C 08/21/11

## 2011-09-05 ENCOUNTER — Ambulatory Visit: Payer: BC Managed Care – PPO

## 2011-09-06 ENCOUNTER — Ambulatory Visit (HOSPITAL_BASED_OUTPATIENT_CLINIC_OR_DEPARTMENT_OTHER): Payer: BC Managed Care – PPO

## 2011-09-06 VITALS — BP 120/73 | HR 75 | Temp 97.9°F

## 2011-09-06 DIAGNOSIS — Z5189 Encounter for other specified aftercare: Secondary | ICD-10-CM

## 2011-09-06 DIAGNOSIS — C50419 Malignant neoplasm of upper-outer quadrant of unspecified female breast: Secondary | ICD-10-CM

## 2011-09-06 MED ORDER — PEGFILGRASTIM INJECTION 6 MG/0.6ML
6.0000 mg | Freq: Once | SUBCUTANEOUS | Status: AC
  Administered 2011-09-06: 6 mg via SUBCUTANEOUS
  Filled 2011-09-06: qty 0.6

## 2011-09-10 ENCOUNTER — Other Ambulatory Visit: Payer: Self-pay | Admitting: Physician Assistant

## 2011-09-10 DIAGNOSIS — C50419 Malignant neoplasm of upper-outer quadrant of unspecified female breast: Secondary | ICD-10-CM

## 2011-09-11 ENCOUNTER — Ambulatory Visit (HOSPITAL_BASED_OUTPATIENT_CLINIC_OR_DEPARTMENT_OTHER): Payer: BC Managed Care – PPO | Admitting: Physician Assistant

## 2011-09-11 ENCOUNTER — Other Ambulatory Visit (HOSPITAL_BASED_OUTPATIENT_CLINIC_OR_DEPARTMENT_OTHER): Payer: BC Managed Care – PPO | Admitting: Lab

## 2011-09-11 ENCOUNTER — Encounter: Payer: Self-pay | Admitting: Physician Assistant

## 2011-09-11 VITALS — BP 123/74 | HR 74 | Temp 98.3°F | Ht 65.0 in | Wt 156.7 lb

## 2011-09-11 DIAGNOSIS — Z170 Estrogen receptor positive status [ER+]: Secondary | ICD-10-CM

## 2011-09-11 DIAGNOSIS — C50419 Malignant neoplasm of upper-outer quadrant of unspecified female breast: Secondary | ICD-10-CM

## 2011-09-11 LAB — CBC WITH DIFFERENTIAL/PLATELET
Basophils Absolute: 0 10*3/uL (ref 0.0–0.1)
EOS%: 0.3 % (ref 0.0–7.0)
Eosinophils Absolute: 0 10*3/uL (ref 0.0–0.5)
HCT: 30.5 % — ABNORMAL LOW (ref 34.8–46.6)
HGB: 10.1 g/dL — ABNORMAL LOW (ref 11.6–15.9)
MCH: 32.4 pg (ref 25.1–34.0)
MONO#: 0.8 10*3/uL (ref 0.1–0.9)
NEUT%: 76.9 % — ABNORMAL HIGH (ref 38.4–76.8)
lymph#: 1.2 10*3/uL (ref 0.9–3.3)

## 2011-09-11 NOTE — Progress Notes (Signed)
Hematology and Oncology Follow Up Visit  Kelly Mills [id number] 08-15-1970 41 y.o. 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 She is currently day 7 cycle 3/4 planned neoadjuvant dose dense FEC with Neulasta support on day 2.  Interim History:   [name] returns today for followup after her third of 4 planned neoadjuvant dose dense FEC being given for an ER/PR positive, HER-2 negative locally advanced left breast carcinoma. She is feeling well. She denies any difficulty with fevers, chills, night sweats. She denies any nausea, emesis, diarrhea or constipation issues. She denies a diffuse bone pain.  She denies heartburn. She feels that her overall energy level is actually pretty good. She denies any abnormal bleeding or bruising symptoms. A detailed review of systems is otherwise noncontributory as noted below.  Review of Systems: Constitutional:  no weight loss, fever, night sweats and feels well Eyes: No complaints ENT: No complaints Cardiovascular: no chest pain or dyspnea on exertion Respiratory: no cough, shortness of breath, or wheezing Neurological: no TIA or stroke symptoms Dermatological: negative Gastrointestinal: no abdominal pain, change in bowel habits, or black or bloody stools Genito-Urinary: no dysuria, trouble voiding, or hematuria Hematological and Lymphatic: negative Breast: negative Musculoskeletal: Occasional muscle pain just lateral to the spine in the upper right back region, known area for "knot". Remaining ROS negative.  Medications:   I have reviewed the patient's current medications.  Current Outpatient Prescriptions  Medication Sig Dispense Refill  . dexamethasone (DECADRON) 4 MG tablet Take 2 tablets by mouth once a day on the day after chemotherapy and then take 2 tablets two times a day for 2 days. Take  with food.  30 tablet  1  . LORazepam (ATIVAN) 0.5 MG tablet Take 1 tablet (0.5 mg total) by mouth every 6 (six) hours as needed (Nausea or vomiting).  30 tablet  0  . Multiple Vitamin (MULTIVITAMIN) capsule Take 1 capsule by mouth daily.      . ondansetron (ZOFRAN) 8 MG tablet Take 1 tablet two times a day as needed for nausea or vomiting starting on the third day after chemotherapy.  30 tablet  1  . prochlorperazine (COMPAZINE) 10 MG tablet Take 1 tablet (10 mg total) by mouth every 6 (six) hours as needed (Nausea or vomiting).  30 tablet  1  . prochlorperazine (COMPAZINE) 25 MG suppository Place 1 suppository (25 mg total) rectally every 12 (twelve) hours as needed for nausea.  12 suppository  3  . TRAZODONE HCL PO Take by mouth as needed.      . zolpidem (AMBIEN) 5 MG tablet Take 1 tablet (5 mg total) by mouth at bedtime as needed.  30 tablet  0    Allergies:  Allergies  Allergen Reactions  . Codeine Nausea Only    Physical Exam: Filed Vitals:   09/11/11 1326  BP: 123/74  Pulse: 74  Temp: 98.3 F (36.8 C)    Body mass index is 26.08 kg/(m^2). Weight: 156 lbs. HEENT:  Sclerae anicteric, conjunctivae pink.  Oropharynx clear.  No mucositis or candidiasis.   Nodes:  No cervical, supraclavicular, or axillary lymphadenopathy palpated.  Breast Exam:  Deferred. Lungs:  Clear to auscultation bilaterally.  No crackles, rhonchi, or wheezes.   Heart:  Regular rate and rhythm.   Abdomen:  Soft, nontender.  Positive bowel sounds.  No organomegaly or masses palpated.  Musculoskeletal:  No focal spinal tenderness to palpation.  Extremities:  Benign.  No peripheral edema or cyanosis.   Skin:  Benign.   Neuro:  Nonfocal, alert and oriented x 3.   Lab Results: CBC pending    Chemistry      Component Value Date/Time   NA 137 09/04/2011 1142   K 3.9 09/04/2011 1142   CL 103 09/04/2011 1142   CO2 26 09/04/2011 1142   BUN 10 09/04/2011 1142   CREATININE 0.68 09/04/2011 1142      Component  Value Date/Time   CALCIUM 9.1 09/04/2011 1142   ALKPHOS 70 09/04/2011 1142   AST 20 09/04/2011 1142   ALT 20 09/04/2011 1142   BILITOT 0.2* 09/04/2011 1142      Lab Results  Component Value Date   LABCA2 19 07/17/2011    Radiological Studies: Dg Chest Port 1 View 07/22/2011  *RADIOLOGY REPORT*  Clinical Data: Port-A-Cath placement  PORTABLE CHEST - 1 VIEW  Comparison: None.  Findings: 0850 hours.  Left-sided Port-A-Cath is noted with distal tip positioned at the distal SVC level.  No evidence for left-sided pneumothorax.  No edema or focal airspace consolidation. Cardiopericardial silhouette is at upper limits of normal for size. Imaged bony structures of the thorax are intact.  IMPRESSION: The tip of the left Port-A-Cath is positioned at the level of the distal SVC.  No evidence for pneumothorax.  Original Report Authenticated By: ERIC A. MANSELL, M.D.    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 She is currently day 7 cycle 3/4 planned neoadjuvant dose dense FEC with Neulasta support on day 2.  Case reviewed with Dr. Pierce Crane.   Plan:  Kelly Mills is doing well.  I will see her in one week's time for followup prior to cycle 4/4  planned neoadjuvant dose dense FEC.  This plan was reviewed with the patient, who voices understanding and agreement.  She knows to call with any changes or problems.    Dalila Arca T, PA-C 08/14/11

## 2011-09-18 ENCOUNTER — Ambulatory Visit (HOSPITAL_BASED_OUTPATIENT_CLINIC_OR_DEPARTMENT_OTHER): Payer: BC Managed Care – PPO | Admitting: Physician Assistant

## 2011-09-18 ENCOUNTER — Other Ambulatory Visit (HOSPITAL_BASED_OUTPATIENT_CLINIC_OR_DEPARTMENT_OTHER): Payer: BC Managed Care – PPO | Admitting: Lab

## 2011-09-18 ENCOUNTER — Encounter: Payer: Self-pay | Admitting: Physician Assistant

## 2011-09-18 ENCOUNTER — Ambulatory Visit (HOSPITAL_BASED_OUTPATIENT_CLINIC_OR_DEPARTMENT_OTHER): Payer: BC Managed Care – PPO

## 2011-09-18 VITALS — BP 121/77 | HR 66 | Temp 98.2°F | Ht 65.0 in | Wt 157.1 lb

## 2011-09-18 DIAGNOSIS — Z170 Estrogen receptor positive status [ER+]: Secondary | ICD-10-CM

## 2011-09-18 DIAGNOSIS — C50419 Malignant neoplasm of upper-outer quadrant of unspecified female breast: Secondary | ICD-10-CM

## 2011-09-18 DIAGNOSIS — Z5111 Encounter for antineoplastic chemotherapy: Secondary | ICD-10-CM

## 2011-09-18 LAB — CBC WITH DIFFERENTIAL/PLATELET
BASO%: 0.9 % (ref 0.0–2.0)
EOS%: 0.2 % (ref 0.0–7.0)
MCH: 31.7 pg (ref 25.1–34.0)
MCHC: 33.4 g/dL (ref 31.5–36.0)
NEUT%: 77.4 % — ABNORMAL HIGH (ref 38.4–76.8)
RDW: 14.3 % (ref 11.2–14.5)
lymph#: 1.8 10*3/uL (ref 0.9–3.3)

## 2011-09-18 LAB — COMPREHENSIVE METABOLIC PANEL
Albumin: 3.9 g/dL (ref 3.5–5.2)
BUN: 11 mg/dL (ref 6–23)
CO2: 28 mEq/L (ref 19–32)
Calcium: 9 mg/dL (ref 8.4–10.5)
Chloride: 103 mEq/L (ref 96–112)
Glucose, Bld: 104 mg/dL — ABNORMAL HIGH (ref 70–99)
Potassium: 3.9 mEq/L (ref 3.5–5.3)

## 2011-09-18 MED ORDER — DEXAMETHASONE SODIUM PHOSPHATE 4 MG/ML IJ SOLN
12.0000 mg | Freq: Once | INTRAMUSCULAR | Status: AC
  Administered 2011-09-18: 20 mg via INTRAVENOUS

## 2011-09-18 MED ORDER — PALONOSETRON HCL INJECTION 0.25 MG/5ML
0.2500 mg | Freq: Once | INTRAVENOUS | Status: AC
  Administered 2011-09-18: 0.25 mg via INTRAVENOUS

## 2011-09-18 MED ORDER — HEPARIN SOD (PORK) LOCK FLUSH 100 UNIT/ML IV SOLN
500.0000 [IU] | Freq: Once | INTRAVENOUS | Status: AC | PRN
  Administered 2011-09-18: 500 [IU]
  Filled 2011-09-18: qty 5

## 2011-09-18 MED ORDER — SODIUM CHLORIDE 0.9 % IJ SOLN
10.0000 mL | INTRAMUSCULAR | Status: DC | PRN
  Administered 2011-09-18: 10 mL
  Filled 2011-09-18: qty 10

## 2011-09-18 MED ORDER — FLUOROURACIL CHEMO INJECTION 2.5 GM/50ML
500.0000 mg/m2 | Freq: Once | INTRAVENOUS | Status: AC
  Administered 2011-09-18: 900 mg via INTRAVENOUS
  Filled 2011-09-18: qty 18

## 2011-09-18 MED ORDER — SODIUM CHLORIDE 0.9 % IV SOLN
Freq: Once | INTRAVENOUS | Status: AC
  Administered 2011-09-18: 14:00:00 via INTRAVENOUS

## 2011-09-18 MED ORDER — EPIRUBICIN HCL CHEMO IV INJECTION 200 MG/100ML
100.0000 mg/m2 | Freq: Once | INTRAVENOUS | Status: AC
  Administered 2011-09-18: 182 mg via INTRAVENOUS
  Filled 2011-09-18: qty 91

## 2011-09-18 MED ORDER — SODIUM CHLORIDE 0.9 % IV SOLN
500.0000 mg/m2 | Freq: Once | INTRAVENOUS | Status: AC
  Administered 2011-09-18: 900 mg via INTRAVENOUS
  Filled 2011-09-18: qty 45

## 2011-09-18 MED ORDER — SODIUM CHLORIDE 0.9 % IV SOLN
150.0000 mg | Freq: Once | INTRAVENOUS | Status: AC
  Administered 2011-09-18: 150 mg via INTRAVENOUS
  Filled 2011-09-18: qty 5

## 2011-09-18 NOTE — Progress Notes (Signed)
Hematology and Oncology Follow Up Visit  Kelly Mills [id number] 1971/02/20 41 y.o. 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 She is currently day 1 cycle 4/4 planned neoadjuvant dose dense FEC with Neulasta support on day 2.  Interim History:   Kelly Mills returns today for followup prior to day 1 cycle 4 of 4 planned neoadjuvant dose dense FEC. She is feeling well today, she denies fevers, chills, night sweats. No nausea, emesis, diarrhea, or constipation issues.  Energy level is quite good. Overall she feels well.  A detailed review of systems is otherwise noncontributory as noted below.  Review of Systems: Constitutional:  no weight loss, fever, night sweats and feels well Eyes: No complaints ENT: No complaints Cardiovascular: no chest pain or dyspnea on exertion Respiratory: no cough, shortness of breath, or wheezing Neurological: no TIA or stroke symptoms Dermatological: negative Gastrointestinal: no abdominal pain, change in bowel habits, or black or bloody stools Genito-Urinary: no dysuria, trouble voiding, or hematuria Hematological and Lymphatic: negative Breast: negative Musculoskeletal: negative Remaining ROS negative.  Medications:   I have reviewed the patient's current medications.  Current Outpatient Prescriptions  Medication Sig Dispense Refill  . dexamethasone (DECADRON) 4 MG tablet Take 2 tablets by mouth once a day on the day after chemotherapy and then take 2 tablets two times a day for 2 days. Take with food.  30 tablet  1  . LORazepam (ATIVAN) 0.5 MG tablet Take 1 tablet (0.5 mg total) by mouth every 6 (six) hours as needed (Nausea or vomiting).  30 tablet  0  . Multiple Vitamin (MULTIVITAMIN) capsule Take 1 capsule by mouth daily.      . ondansetron (ZOFRAN) 8 MG tablet Take 1 tablet two times a day as needed  for nausea or vomiting starting on the third day after chemotherapy.  30 tablet  1  . prochlorperazine (COMPAZINE) 10 MG tablet Take 1 tablet (10 mg total) by mouth every 6 (six) hours as needed (Nausea or vomiting).  30 tablet  1  . prochlorperazine (COMPAZINE) 25 MG suppository Place 1 suppository (25 mg total) rectally every 12 (twelve) hours as needed for nausea.  12 suppository  3  . TRAZODONE HCL PO Take by mouth as needed.      . zolpidem (AMBIEN) 5 MG tablet Take 1 tablet (5 mg total) by mouth at bedtime as needed.  30 tablet  0    Allergies:  Allergies  Allergen Reactions  . Codeine Nausea Only    Physical Exam: Filed Vitals:   09/18/11 1143  BP: 121/77  Pulse: 66  Temp: 98.2 F (36.8 C)    Body mass index is 26.14 kg/(m^2). Weight: 157 lbs. HEENT:  Sclerae anicteric, conjunctivae pink.  Oropharynx clear.  No mucositis or candidiasis.   Nodes:  No cervical, supraclavicular, or axillary lymphadenopathy palpated.  Breast Exam:  Deferred. Lungs:  Clear to auscultation bilaterally.  No crackles, rhonchi, or wheezes.   Heart:  Regular rate and rhythm.   Abdomen:  Soft, nontender.  Positive bowel sounds.  No organomegaly or masses palpated.   Musculoskeletal:  No focal spinal tenderness to palpation.  Extremities:  Benign.  No peripheral edema or cyanosis.   Skin:  Benign.   Neuro:  Nonfocal, alert and oriented x 3.   Lab Results: Lab Results  Component Value Date   WBC 12.6* 09/18/2011  HGB 9.9* 09/18/2011   HCT 29.6* 09/18/2011   MCV 94.9 09/18/2011   PLT 136* 09/18/2011   NEUTROABS 9.8* 09/18/2011     Chemistry      Component Value Date/Time   NA 137 09/04/2011 1142   K 3.9 09/04/2011 1142   CL 103 09/04/2011 1142   CO2 26 09/04/2011 1142   BUN 10 09/04/2011 1142   CREATININE 0.68 09/04/2011 1142      Component Value Date/Time   CALCIUM 9.1 09/04/2011 1142   ALKPHOS 70 09/04/2011 1142   AST 20 09/04/2011 1142   ALT 20 09/04/2011 1142   BILITOT 0.2* 09/04/2011 1142       Lab Results  Component Value Date   LABCA2 19 07/17/2011     Assessment:  Kelly Mills is a 41 year old [place] woman with a newly diagnosed ER/PR positive at 98/80% respectively, 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 She is currently day 1 cycle 4/4 planned neoadjuvant dose dense FEC with Neulasta support on day 2.  Case to be reviewed with Dr. Pierce Crane.   Plan:  Kelly Mills is doing well, and will proceed to day 1 cycle 4 neoadjuvant dose dense FEC. She'll return tomorrow for Neulasta providing granulocytes support. I will see her in one week's time for nadir assessment, prior to which we will obtain a bilateral breast MRI. I will also refer her back to Dr. Dwain Sarna for interim surgical check.  She knows to contact us sooner if the need should arise.  This plan was reviewed with the patient, who voices understanding and agreement.  She knows to call with any changes or problems.    Kelly Adinolfi T, PA-C 09/18/11

## 2011-09-19 ENCOUNTER — Ambulatory Visit (HOSPITAL_BASED_OUTPATIENT_CLINIC_OR_DEPARTMENT_OTHER): Payer: BC Managed Care – PPO

## 2011-09-19 ENCOUNTER — Telehealth: Payer: Self-pay | Admitting: *Deleted

## 2011-09-19 VITALS — BP 120/70 | HR 80 | Temp 97.7°F

## 2011-09-19 DIAGNOSIS — C50419 Malignant neoplasm of upper-outer quadrant of unspecified female breast: Secondary | ICD-10-CM

## 2011-09-19 MED ORDER — PEGFILGRASTIM INJECTION 6 MG/0.6ML
6.0000 mg | Freq: Once | SUBCUTANEOUS | Status: AC
  Administered 2011-09-19: 6 mg via SUBCUTANEOUS
  Filled 2011-09-19: qty 0.6

## 2011-09-19 NOTE — Telephone Encounter (Signed)
Per staff message from Crystal, I have scheduled treatment appts. Appts in computer and Crystal aware.  JMW  

## 2011-09-20 ENCOUNTER — Telehealth: Payer: Self-pay | Admitting: *Deleted

## 2011-09-20 NOTE — Telephone Encounter (Signed)
per conversation with patient she spoke with christine and christine told her she could cancel that lab appointment

## 2011-09-24 ENCOUNTER — Telehealth: Payer: Self-pay | Admitting: Oncology

## 2011-09-24 NOTE — Telephone Encounter (Signed)
S/w the pt and she is aware to pick up her may,june 2013 appt schedules.

## 2011-09-25 ENCOUNTER — Encounter: Payer: Self-pay | Admitting: Physician Assistant

## 2011-09-25 ENCOUNTER — Ambulatory Visit (HOSPITAL_BASED_OUTPATIENT_CLINIC_OR_DEPARTMENT_OTHER): Payer: BC Managed Care – PPO | Admitting: Physician Assistant

## 2011-09-25 ENCOUNTER — Other Ambulatory Visit: Payer: BC Managed Care – PPO | Admitting: Lab

## 2011-09-25 VITALS — BP 117/75 | HR 67 | Temp 98.2°F | Ht 65.0 in | Wt 156.8 lb

## 2011-09-25 DIAGNOSIS — C50419 Malignant neoplasm of upper-outer quadrant of unspecified female breast: Secondary | ICD-10-CM

## 2011-09-25 DIAGNOSIS — C50919 Malignant neoplasm of unspecified site of unspecified female breast: Secondary | ICD-10-CM

## 2011-09-25 LAB — CBC WITH DIFFERENTIAL/PLATELET
Basophils Absolute: 0 10*3/uL (ref 0.0–0.1)
Eosinophils Absolute: 0 10*3/uL (ref 0.0–0.5)
HGB: 9.4 g/dL — ABNORMAL LOW (ref 11.6–15.9)
MONO#: 0.3 10*3/uL (ref 0.1–0.9)
NEUT#: 4.7 10*3/uL (ref 1.5–6.5)
RDW: 14.3 % (ref 11.2–14.5)
WBC: 5.8 10*3/uL (ref 3.9–10.3)
lymph#: 0.7 10*3/uL — ABNORMAL LOW (ref 0.9–3.3)

## 2011-09-25 NOTE — Progress Notes (Signed)
Hematology and Oncology Follow Up Visit  Kelly Mills [id number] Sep 28, 1970 41 y.o. 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 She is currently day 7 cycle 4/4 planned neoadjuvant dose dense FEC with Neulasta support on day 2.  Interim History:   Kelly Mills returns today for followup after her fourth of 4 planned neoadjuvant dose dense FEC being given for an ER/PR positive, HER-2 negative locally advanced left breast carcinoma. She is feeling well. She denies any difficulty with fevers, chills, night sweats. She denies any nausea, emesis, diarrhea or constipation issues. She denies a diffuse bone pain.  She has noted a bit more fatigue this go around, she has also had a bit of difficulty with heartburn which is now resolved. She is scheduled for bilateral breast MRI tomorrow.  A detailed review of systems is otherwise noncontributory as noted below.  Review of Systems: Constitutional:  no weight loss, fever, night sweats and feels well Eyes: No complaints ENT: No complaints Cardiovascular: no chest pain or dyspnea on exertion Respiratory: no cough, shortness of breath, or wheezing Neurological: no TIA or stroke symptoms Dermatological: negative Gastrointestinal: no abdominal pain, change in bowel habits, or black or bloody stools Genito-Urinary: no dysuria, trouble voiding, or hematuria Hematological and Lymphatic: negative Breast: negative Musculoskeletal: Occasional muscle pain just lateral to the spine in the upper right back region, known area for "knot". Remaining ROS negative.  Medications:   I have reviewed the patient's current medications.  Current Outpatient Prescriptions  Medication Sig Dispense Refill  . dexamethasone (DECADRON) 4 MG tablet Take 2 tablets by mouth once a day on the day after chemotherapy and then take 2  tablets two times a day for 2 days. Take with food.  30 tablet  1  . LORazepam (ATIVAN) 0.5 MG tablet Take 1 tablet (0.5 mg total) by mouth every 6 (six) hours as needed (Nausea or vomiting).  30 tablet  0  . Multiple Vitamin (MULTIVITAMIN) capsule Take 1 capsule by mouth daily.      . ondansetron (ZOFRAN) 8 MG tablet Take 1 tablet two times a day as needed for nausea or vomiting starting on the third day after chemotherapy.  30 tablet  1  . prochlorperazine (COMPAZINE) 10 MG tablet Take 1 tablet (10 mg total) by mouth every 6 (six) hours as needed (Nausea or vomiting).  30 tablet  1  . prochlorperazine (COMPAZINE) 25 MG suppository Place 1 suppository (25 mg total) rectally every 12 (twelve) hours as needed for nausea.  12 suppository  3  . TRAZODONE HCL PO Take by mouth as needed.      . zolpidem (AMBIEN) 5 MG tablet Take 1 tablet (5 mg total) by mouth at bedtime as needed.  30 tablet  0    Allergies:  Allergies  Allergen Reactions  . Codeine Nausea Only    Physical Exam: Filed Vitals:   09/25/11 1114  BP: 117/75  Pulse: 67  Temp: 98.2 F (36.8 C)    Body mass index is 26.09 kg/(m^2). Weight: 156 lbs. HEENT:  Sclerae anicteric, conjunctivae pink.  Oropharynx clear.  No mucositis or candidiasis.   Nodes:  No cervical, supraclavicular, or axillary lymphadenopathy palpated.  Breast Exam:  Deferred. Lungs:  Clear to auscultation bilaterally.  No crackles, rhonchi, or wheezes.   Heart:  Regular rate and rhythm.   Abdomen:  Soft, nontender.  Positive bowel sounds.  No organomegaly or masses palpated.   Musculoskeletal:  No focal spinal tenderness to palpation.  Extremities:  Benign.  No peripheral edema or cyanosis.   Skin:  Benign.   Neuro:  Nonfocal, alert and oriented x 3.   Lab Results: 09/25/11: WBC: 5.8 ANC: 4.7 HBG: 9.4 PLTC: 119    Chemistry      Component Value Date/Time   NA 137 09/18/2011 1129   K 3.9 09/18/2011 1129   CL 103 09/18/2011 1129   CO2 28 09/18/2011 1129    BUN 11 09/18/2011 1129   CREATININE 0.67 09/18/2011 1129      Component Value Date/Time   CALCIUM 9.0 09/18/2011 1129   ALKPHOS 79 09/18/2011 1129   AST 26 09/18/2011 1129   ALT 26 09/18/2011 1129   BILITOT 0.2* 09/18/2011 1129      Lab Results  Component Value Date   LABCA2 19 07/17/2011    Radiological Studies: Dg Chest Port 1 View 07/22/2011  *RADIOLOGY REPORT*  Clinical Data: Port-A-Cath placement  PORTABLE CHEST - 1 VIEW  Comparison: None.  Findings: 0850 hours.  Left-sided Port-A-Cath is noted with distal tip positioned at the distal SVC level.  No evidence for left-sided pneumothorax.  No edema or focal airspace consolidation. Cardiopericardial silhouette is at upper limits of normal for size. Imaged bony structures of the thorax are intact.  IMPRESSION: The tip of the left Port-A-Cath is positioned at the level of the distal SVC.  No evidence for pneumothorax.  Original Report Authenticated By: ERIC A. MANSELL, M.D.    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 She is currently day 7 cycle 4/4 planned neoadjuvant dose dense FEC with Neulasta support on day 2.  Case to be reviewed with Dr. Pierce Crane.   Plan:  Kelly Mills is doing well.  I will see her in one week's time for followup prior to cycle 1/4  planned neoadjuvant dose dense Taxotere, again with Neulasta on day 2.  Side effect profile has been discussed, appropriate Decadron premedication schedule reviewed. We will also review breast MRI results. This plan was reviewed with the patient, who voices understanding and agreement.  She knows to call with any changes or problems.    Medard Decuir T, PA-C 09/25/11

## 2011-09-25 NOTE — Patient Instructions (Signed)
The way you take Decadron 4mg  tablets will be changed for Taxotere (which will also be given every 2 weeks) For the 1st  Cycle scheduled for 10/02/11-                                                                    Decadron 8mg  in the AM and PM on 10/01/11                                                                    On 10/02/11 (day of chemo) SKIP AM dose, but will take 8mg  in PM                                                                    On 10/03/11: Decadron 8mg  in AM and PM  2. We will still be using Neulasta on day 2. (Claritin ok as before)  3. Instead of taking Decadron for 3 full days after chemo, only use as above.  4. Zofran 8mg  in AM and PM for 3 days after chemo, with Compazine 10mg  for backup.

## 2011-09-26 ENCOUNTER — Ambulatory Visit (HOSPITAL_COMMUNITY)
Admission: RE | Admit: 2011-09-26 | Discharge: 2011-09-26 | Disposition: A | Discharge status: Home / Self Care | Payer: BC Managed Care – PPO | Source: Ambulatory Visit | Attending: Physician Assistant | Admitting: Physician Assistant

## 2011-09-26 DIAGNOSIS — C50419 Malignant neoplasm of upper-outer quadrant of unspecified female breast: Secondary | ICD-10-CM | POA: Insufficient documentation

## 2011-09-26 DIAGNOSIS — C773 Secondary and unspecified malignant neoplasm of axilla and upper limb lymph nodes: Secondary | ICD-10-CM | POA: Insufficient documentation

## 2011-10-01 ENCOUNTER — Ambulatory Visit: Payer: BC Managed Care – PPO

## 2011-10-02 ENCOUNTER — Ambulatory Visit (HOSPITAL_BASED_OUTPATIENT_CLINIC_OR_DEPARTMENT_OTHER): Payer: BC Managed Care – PPO | Admitting: Oncology

## 2011-10-02 ENCOUNTER — Ambulatory Visit: Payer: BC Managed Care – PPO

## 2011-10-02 ENCOUNTER — Ambulatory Visit: Payer: BC Managed Care – PPO | Admitting: Physician Assistant

## 2011-10-02 ENCOUNTER — Other Ambulatory Visit: Payer: Self-pay | Admitting: *Deleted

## 2011-10-02 ENCOUNTER — Other Ambulatory Visit (HOSPITAL_BASED_OUTPATIENT_CLINIC_OR_DEPARTMENT_OTHER): Payer: BC Managed Care – PPO | Admitting: Lab

## 2011-10-02 VITALS — BP 116/72 | HR 88 | Temp 97.4°F | Ht 65.0 in | Wt 154.1 lb

## 2011-10-02 DIAGNOSIS — C50419 Malignant neoplasm of upper-outer quadrant of unspecified female breast: Secondary | ICD-10-CM

## 2011-10-02 DIAGNOSIS — Z170 Estrogen receptor positive status [ER+]: Secondary | ICD-10-CM

## 2011-10-02 LAB — COMPREHENSIVE METABOLIC PANEL
ALT: 25 U/L (ref 0–35)
Alkaline Phosphatase: 75 U/L (ref 39–117)
Sodium: 141 mEq/L (ref 135–145)
Total Bilirubin: 0.3 mg/dL (ref 0.3–1.2)
Total Protein: 6.4 g/dL (ref 6.0–8.3)

## 2011-10-02 LAB — CBC WITH DIFFERENTIAL/PLATELET
EOS%: 0.2 % (ref 0.0–7.0)
Eosinophils Absolute: 0 10*3/uL (ref 0.0–0.5)
MCH: 33.8 pg (ref 25.1–34.0)
MCV: 99.1 fL (ref 79.5–101.0)
MONO%: 9.5 % (ref 0.0–14.0)
NEUT#: 8 10*3/uL — ABNORMAL HIGH (ref 1.5–6.5)
RBC: 2.86 10*6/uL — ABNORMAL LOW (ref 3.70–5.45)
RDW: 16.3 % — ABNORMAL HIGH (ref 11.2–14.5)
nRBC: 0 % (ref 0–0)

## 2011-10-02 MED ORDER — ONDANSETRON HCL 8 MG PO TABS: ORAL_TABLET | ORAL | Status: DC

## 2011-10-02 MED ORDER — OXYCODONE HCL 5 MG PO TABS: ORAL_TABLET | ORAL | Status: DC

## 2011-10-02 MED ORDER — PROCHLORPERAZINE MALEATE 10 MG PO TABS: 10.0000 mg | ORAL_TABLET | Freq: Four times a day (QID) | ORAL | Status: DC | PRN

## 2011-10-02 MED ORDER — DEXAMETHASONE 4 MG PO TABS: ORAL_TABLET | ORAL | Status: DC

## 2011-10-02 MED ORDER — LORAZEPAM 0.5 MG PO TABS: 0.5000 mg | ORAL_TABLET | Freq: Four times a day (QID) | ORAL | Status: DC | PRN

## 2011-10-02 MED ORDER — PROCHLORPERAZINE 25 MG RE SUPP: 25.0000 mg | Freq: Two times a day (BID) | RECTAL | Status: DC | PRN

## 2011-10-02 NOTE — Progress Notes (Signed)
Hematology and Oncology Follow Up Visit  Kelly Mills [id number] 05/31/1970 41 y.o. 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 She is currently status post /neoadjuvant dose dense FEC with Neulasta support on day 2.  Interim History:   Martie returns today for followup after her fourth of 4 planned neoadjuvant dose dense FEC being given for an ER/PR positive, HER-2 negative locally advanced left breast carcinoma. She is feeling well. She denies any difficulty with fevers, chills, night sweats. She denies any nausea, emesis, diarrhea or constipation issues. She denies a diffuse bone pain.  She has noted a bit more fatigue this go around, she has also had a bit of difficulty with heartburn which is now resolved. She had an MRI scan for a few days ago which essentially showed a fairly dramatic decrease in size of the mass the right breast. This measures 1.6 and is in greatest dimension as compared to 3.4 cm in greatest dimension prior to starting treatment. A detailed review of systems is otherwise noncontributory as noted below.  Review of Systems: Constitutional:  no weight loss, fever, night sweats and feels well Eyes: No complaints ENT: No complaints Cardiovascular: no chest pain or dyspnea on exertion Respiratory: no cough, shortness of breath, or wheezing Neurological: no TIA or stroke symptoms Dermatological: negative Gastrointestinal: no abdominal pain, change in bowel habits, or black or bloody stools Genito-Urinary: no dysuria, trouble voiding, or hematuria Hematological and Lymphatic: negative Breast: negative Musculoskeletal: Occasional muscle pain just lateral to the spine in the upper right back region, known area for "knot". Remaining ROS negative.  Medications:   I have reviewed the patient's current  medications.  Current Outpatient Prescriptions  Medication Sig Dispense Refill  . dexamethasone (DECADRON) 4 MG tablet Take 2 tablets by mouth once a day on the day after chemotherapy and then take 2 tablets two times a day for 2 days. Take with food.  30 tablet  1  . LORazepam (ATIVAN) 0.5 MG tablet Take 1 tablet (0.5 mg total) by mouth every 6 (six) hours as needed (Nausea or vomiting).  30 tablet  0  . Multiple Vitamin (MULTIVITAMIN) capsule Take 1 capsule by mouth daily.      . ondansetron (ZOFRAN) 8 MG tablet Take 1 tablet two times a day as needed for nausea or vomiting starting on the third day after chemotherapy.  30 tablet  1  . prochlorperazine (COMPAZINE) 10 MG tablet Take 1 tablet (10 mg total) by mouth every 6 (six) hours as needed (Nausea or vomiting).  30 tablet  1  . prochlorperazine (COMPAZINE) 25 MG suppository Place 1 suppository (25 mg total) rectally every 12 (twelve) hours as needed for nausea.  12 suppository  3  . TRAZODONE HCL PO Take by mouth as needed.      . zolpidem (AMBIEN) 5 MG tablet Take 1 tablet (5 mg total) by mouth at bedtime as needed.  30 tablet  0  . dexamethasone (DECADRON) 4 MG tablet Take 2 tabs two times a day the day before Taxotere chemo. Then take 2 tabs daily starting the day after chemo for 2 days.  30 tablet  1  . LORazepam (ATIVAN) 0.5 MG tablet Take 1 tablet (0.5 mg total) by mouth every 6 (six) hours as needed (Nausea or vomiting).  30 tablet  0  . ondansetron (ZOFRAN) 8 MG tablet  Take 1 tablet two times a day for 2 days starting the day after chemo, then take two times a day as needed for nausea or vomiting.  30 tablet  1  . prochlorperazine (COMPAZINE) 10 MG tablet Take 1 tablet (10 mg total) by mouth every 6 (six) hours as needed (Nausea or vomiting).  30 tablet  1  . prochlorperazine (COMPAZINE) 25 MG suppository Place 1 suppository (25 mg total) rectally every 12 (twelve) hours as needed for nausea.  12 suppository  3    Allergies:   Allergies  Allergen Reactions  . Codeine Nausea Only    Physical Exam: Filed Vitals:   10/02/11 1354  BP: 116/72  Pulse: 88  Temp: 97.4 F (36.3 C)    Body mass index is 25.64 kg/(m^2). Weight: 156 lbs. HEENT:  Sclerae anicteric, conjunctivae pink.  Oropharynx clear.  No mucositis or candidiasis.   Nodes:  No cervical, supraclavicular, or axillary lymphadenopathy palpated.  Breast Exam:  I cannot appreciate any masses in the right breast there may be a slight firm area of firmness in the lateral portion of the right breast approximately the 8:00 position. I could not appreciate any axillary adenopathy.  Lungs:  Clear to auscultation bilaterally.  No crackles, rhonchi, or wheezes.   Heart:  Regular rate and rhythm.   Abdomen:  Soft, nontender.  Positive bowel sounds.  No organomegaly or masses palpated.   Musculoskeletal:  No focal spinal tenderness to palpation.  Extremities:  Benign.  No peripheral edema or cyanosis.   Skin:  Benign.   Neuro:  Nonfocal, alert and oriented x 3.   Lab Results: 09/25/11: WBC: 5.8 ANC: 4.7 HBG: 9.4 PLTC: 119    Chemistry      Component Value Date/Time   NA 137 09/18/2011 1129   K 3.9 09/18/2011 1129   CL 103 09/18/2011 1129   CO2 28 09/18/2011 1129   BUN 11 09/18/2011 1129   CREATININE 0.67 09/18/2011 1129      Component Value Date/Time   CALCIUM 9.0 09/18/2011 1129   ALKPHOS 79 09/18/2011 1129   AST 26 09/18/2011 1129   ALT 26 09/18/2011 1129   BILITOT 0.2* 09/18/2011 1129      Lab Results  Component Value Date   LABCA2 19 07/17/2011    Radiological Studies: Dg Chest Port 1 View 07/22/2011  *RADIOLOGY REPORT*  Clinical Data: Port-A-Cath placement  PORTABLE CHEST - 1 VIEW  Comparison: None.  Findings: 0850 hours.  Left-sided Port-A-Cath is noted with distal tip positioned at the distal SVC level.  No evidence for left-sided pneumothorax.  No edema or focal airspace consolidation. Cardiopericardial silhouette is at upper limits of normal for size.  Imaged bony structures of the thorax are intact.  IMPRESSION: The tip of the left Port-A-Cath is positioned at the level of the distal SVC.  No evidence for pneumothorax.  Original Report Authenticated By: ERIC A. MANSELL, M.D.    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 She is currently day 7 cycle 4/4 planned neoadjuvant dose dense FEC with Neulasta support on day 2.     Plan:  Margie is doing well.  She is due to start Taxotere today there was some confusion in her schedule. We did review her MRI scan reports we reviewed the actual images as compared to the baseline study in March. She is encouraged by these  results. She has done well with her initial chemotherapy will hopefully begin Taxotere tomorrow. I will plan to see her the following week as well. I have given her premedications including that the Decadron.  This plan was reviewed with the patient, who voices understanding and agreement.  She knows to call with any changes or problems.    Ozetta Flatley,MD 09/25/11

## 2011-10-03 ENCOUNTER — Ambulatory Visit: Payer: BC Managed Care – PPO

## 2011-10-03 ENCOUNTER — Ambulatory Visit (HOSPITAL_BASED_OUTPATIENT_CLINIC_OR_DEPARTMENT_OTHER): Payer: BC Managed Care – PPO

## 2011-10-03 VITALS — BP 152/91 | HR 61 | Temp 98.6°F

## 2011-10-03 DIAGNOSIS — Z5111 Encounter for antineoplastic chemotherapy: Secondary | ICD-10-CM

## 2011-10-03 DIAGNOSIS — Z170 Estrogen receptor positive status [ER+]: Secondary | ICD-10-CM

## 2011-10-03 DIAGNOSIS — C50419 Malignant neoplasm of upper-outer quadrant of unspecified female breast: Secondary | ICD-10-CM

## 2011-10-03 MED ORDER — ONDANSETRON 8 MG/50ML IVPB (CHCC)
8.0000 mg | Freq: Once | INTRAVENOUS | Status: AC
  Administered 2011-10-03: 8 mg via INTRAVENOUS

## 2011-10-03 MED ORDER — DOCETAXEL CHEMO INJECTION 160 MG/16ML
75.0000 mg/m2 | Freq: Once | INTRAVENOUS | Status: AC
  Administered 2011-10-03: 130 mg via INTRAVENOUS
  Filled 2011-10-03: qty 13

## 2011-10-03 MED ORDER — DEXAMETHASONE SODIUM PHOSPHATE 10 MG/ML IJ SOLN
10.0000 mg | Freq: Once | INTRAMUSCULAR | Status: AC
  Administered 2011-10-03: 10 mg via INTRAVENOUS

## 2011-10-03 MED ORDER — SODIUM CHLORIDE 0.9 % IV SOLN
Freq: Once | INTRAVENOUS | Status: AC
  Administered 2011-10-03: 10:00:00 via INTRAVENOUS

## 2011-10-03 MED ORDER — HEPARIN SOD (PORK) LOCK FLUSH 100 UNIT/ML IV SOLN
500.0000 [IU] | Freq: Once | INTRAVENOUS | Status: AC | PRN
  Administered 2011-10-03: 500 [IU]
  Filled 2011-10-03: qty 5

## 2011-10-03 MED ORDER — SODIUM CHLORIDE 0.9 % IJ SOLN
10.0000 mL | INTRAMUSCULAR | Status: DC | PRN
  Administered 2011-10-03: 10 mL
  Filled 2011-10-03: qty 10

## 2011-10-03 NOTE — Patient Instructions (Signed)
[facility] Discharge Instructions for Patients Receiving Chemotherapy  Today you received the following chemotherapy agents Taxotere To help prevent nausea and vomiting after your treatment, we encourage you to take your nausea medication as prescribed.  If you develop nausea and vomiting that is not controlled by your nausea medication, call the clinic. If it is after clinic hours your family physician or the after hours number for the clinic or go to the Emergency Department.   BELOW ARE SYMPTOMS THAT SHOULD BE REPORTED IMMEDIATELY:  *FEVER GREATER THAN 100.5 F  *CHILLS WITH OR WITHOUT FEVER  NAUSEA AND VOMITING THAT IS NOT CONTROLLED WITH YOUR NAUSEA MEDICATION  *UNUSUAL SHORTNESS OF BREATH  *UNUSUAL BRUISING OR BLEEDING  TENDERNESS IN MOUTH AND THROAT WITH OR WITHOUT PRESENCE OF ULCERS  *URINARY PROBLEMS  *BOWEL PROBLEMS  UNUSUAL RASH Items with * indicate a potential emergency and should be followed up as soon as possible.  One of the nurses will contact you 24 hours after your treatment. Please let the nurse know about any problems that you may have experienced. Feel free to call the clinic you have any questions or concerns. The clinic phone number is [phone].   I have been informed and understand all the instructions given to me. I know to contact the clinic, my physician, or go to the Emergency Department if any problems should occur. I do not have any questions at this time, but understand that I may call the clinic during office hours   should I have any questions or need assistance in obtaining follow up care.    __________________________________________  _____________  __________ Signature of Patient or Authorized Representative            Date                   Time    __________________________________________ Nurse's Signature    

## 2011-10-04 ENCOUNTER — Ambulatory Visit (INDEPENDENT_AMBULATORY_CARE_PROVIDER_SITE_OTHER): Payer: BC Managed Care – PPO | Admitting: General Surgery

## 2011-10-04 ENCOUNTER — Ambulatory Visit (HOSPITAL_BASED_OUTPATIENT_CLINIC_OR_DEPARTMENT_OTHER): Payer: BC Managed Care – PPO

## 2011-10-04 ENCOUNTER — Telehealth: Payer: Self-pay | Admitting: *Deleted

## 2011-10-04 ENCOUNTER — Other Ambulatory Visit (INDEPENDENT_AMBULATORY_CARE_PROVIDER_SITE_OTHER): Payer: Self-pay | Admitting: General Surgery

## 2011-10-04 ENCOUNTER — Encounter (INDEPENDENT_AMBULATORY_CARE_PROVIDER_SITE_OTHER): Payer: Self-pay | Admitting: General Surgery

## 2011-10-04 VITALS — BP 122/86 | HR 68 | Temp 98.2°F | Resp 14 | Ht 67.0 in | Wt 152.0 lb

## 2011-10-04 VITALS — BP 122/77 | HR 73 | Temp 98.0°F

## 2011-10-04 DIAGNOSIS — C50419 Malignant neoplasm of upper-outer quadrant of unspecified female breast: Secondary | ICD-10-CM

## 2011-10-04 DIAGNOSIS — Z5189 Encounter for other specified aftercare: Secondary | ICD-10-CM

## 2011-10-04 MED ORDER — PEGFILGRASTIM INJECTION 6 MG/0.6ML
6.0000 mg | Freq: Once | SUBCUTANEOUS | Status: AC
  Administered 2011-10-04: 6 mg via SUBCUTANEOUS
  Filled 2011-10-04: qty 0.6

## 2011-10-04 NOTE — Progress Notes (Signed)
Subjective:     Patient ID: Kelly Mills, female   DOB: 10/30/1970, 41 y.o.   MRN: [id number]  HPI 41 yof who had locally advanced right breast cancer with positive nodes on presentation.  She has begun primary systemic therapy and has had good response so far.  She has just begun taxotere yesterday.  She has done well with chemotherapy.  She states the right breast mass is smaller on her exam.   Review of Systems BILATERAL BREAST MRI WITH AND WITHOUT CONTRAST  Technique: Multiplanar, multisequence MR images of both breasts  were obtained prior to and following the intravenous administration  of 14ml of multihance. Three dimensional images were evaluated at  the independent DynaCad workstation.  Comparison: Prior MRI dated 07/13/2011  Findings: There is a moderate background parenchymal enhancement  pattern.  There has been interval decrease in the enhancement and size of the  mass in the 9 o'clock region of the right breast, far posteriorly  with a central clip artifact. The mass measures 1.6 x 1.0 x 1.2  cm. On the prior MRI dated 07/13/2011 this mass measured 3.3 x 2.3  x 2.0 cm. The second mass located posterior and inferior that was  biopsied with ultrasound guidance and proven to be a second area of  invasive mammary carcinoma also has decreased in size and  enhancement. Minimal enhancement resides in this area. Right  axillary adenopathy has decreased in size. The largest right  axillary lymph node measures 1.1 cm.  No abnormal enhancement is seen in the left breast. There is a  left-sided Port-A-Cath.  IMPRESSION:  Interval decrease in the size and enhancement of the masses and  axillary adenopathy in the right breast suggesting good response to  neoadjuvant chemotherapy. Continued treatment planning would be  suggested.     Objective:   Physical Exam  Vitals reviewed. Constitutional: She appears well-developed and well-nourished.  Pulmonary/Chest: Right breast  exhibits mass. Right breast exhibits no inverted nipple, no nipple discharge, no skin change and no tenderness. Left breast exhibits no inverted nipple, no mass, no nipple discharge, no skin change and no tenderness. Breasts are symmetrical.    Lymphadenopathy:    She has no cervical adenopathy.    She has no axillary adenopathy.       Right: No supraclavicular adenopathy present.       Left: No supraclavicular adenopathy present.       Assessment:     Locally advanced right breast cancer undergoing primary systemic chemotherapy    Plan:     She has had a good response so far and I think she is already candidate for lumpectomy.  We discussed lumpectomy with alnd about 3 weeks after completing chemotherapy and this is what she would like to do.  I will order mri for end of chemo and see her right after that for final planning.

## 2011-10-04 NOTE — Telephone Encounter (Signed)
No adverse event from tx.

## 2011-10-08 ENCOUNTER — Other Ambulatory Visit: Payer: Self-pay | Admitting: *Deleted

## 2011-10-08 ENCOUNTER — Other Ambulatory Visit (HOSPITAL_BASED_OUTPATIENT_CLINIC_OR_DEPARTMENT_OTHER): Payer: BC Managed Care – PPO | Admitting: Lab

## 2011-10-08 ENCOUNTER — Ambulatory Visit (HOSPITAL_BASED_OUTPATIENT_CLINIC_OR_DEPARTMENT_OTHER): Payer: BC Managed Care – PPO | Admitting: Oncology

## 2011-10-08 VITALS — BP 115/72 | HR 89 | Temp 98.3°F | Ht 67.0 in | Wt 156.6 lb

## 2011-10-08 DIAGNOSIS — Z170 Estrogen receptor positive status [ER+]: Secondary | ICD-10-CM

## 2011-10-08 DIAGNOSIS — C50419 Malignant neoplasm of upper-outer quadrant of unspecified female breast: Secondary | ICD-10-CM

## 2011-10-08 LAB — COMPREHENSIVE METABOLIC PANEL
AST: 13 U/L (ref 0–37)
BUN: 14 mg/dL (ref 6–23)
Calcium: 8.7 mg/dL (ref 8.4–10.5)
Chloride: 103 mEq/L (ref 96–112)
Creatinine, Ser: 0.67 mg/dL (ref 0.50–1.10)

## 2011-10-08 LAB — CBC WITH DIFFERENTIAL/PLATELET
BASO%: 0.4 % (ref 0.0–2.0)
EOS%: 0.3 % (ref 0.0–7.0)
HCT: 28.1 % — ABNORMAL LOW (ref 34.8–46.6)
LYMPH%: 8.7 % — ABNORMAL LOW (ref 14.0–49.7)
MCH: 33.6 pg (ref 25.1–34.0)
MCHC: 33.8 g/dL (ref 31.5–36.0)
NEUT%: 87.7 % — ABNORMAL HIGH (ref 38.4–76.8)
Platelets: 142 10*3/uL — ABNORMAL LOW (ref 145–400)

## 2011-10-08 NOTE — Progress Notes (Signed)
Hematology and Oncology Follow Up Visit  Kelly Mills [id number] 1970-11-12 41 y.o. 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 She is currently status post /neoadjuvant dose dense taxotere C1 , d7 with Neulasta support on day 2.  Interim History:   [name] returns today for followup after her 1st taxotere dose  being given for an ER/PR positive, HER-2 negative locally advanced left breast carcinoma. She is feeling well. She denies any difficulty with fevers, chills, night sweats. She denies any nausea, emesis, diarrhea or constipation issues. She denies a diffuse bone pain.  She has noted a bit more fatigue this go around, she has also had a bit of difficulty with heartburn which is now resolved. She had an MRI scan for a few days ago which essentially showed a fairly dramatic decrease in size of the mass the right breast. This measures 1.6 and is in greatest dimension as compared to 3.4 cm in greatest dimension prior to starting treatment. A detailed review of systems is otherwise noncontributory as noted below.she did well in general, taking advil/tylenol for joint pain. She feels well otherwise.she had a subjective sense of heart fluttering.  Review of Systems: Constitutional:  no weight loss, fever, night sweats and feels well Eyes: No complaints ENT: No complaints Cardiovascular: no chest pain or dyspnea on exertion Respiratory: no cough, shortness of breath, or wheezing Neurological: no TIA or stroke symptoms.  Dermatological: negative Gastrointestinal: no abdominal pain, change in bowel habits, or black or bloody stools Genito-Urinary: no dysuria, trouble voiding, or hematuria Hematological and Lymphatic: negative Breast: negative Musculoskeletal: Occasional muscle pain just lateral to the spine in the upper right back region, known  area for "knot". Remaining ROS negative.  Medications:   I have reviewed the patient's current medications.  Current Outpatient Prescriptions  Medication Sig Dispense Refill  . dexamethasone (DECADRON) 4 MG tablet Take 2 tabs two times a day the day before Taxotere chemo. Then take 2 tabs daily starting the day after chemo for 2 days.  30 tablet  1  . lidocaine-prilocaine (EMLA) cream Ad lib.      Marland Kitchen LORazepam (ATIVAN) 0.5 MG tablet Take 1 tablet (0.5 mg total) by mouth every 6 (six) hours as needed (Nausea or vomiting).  30 tablet  0  . Multiple Vitamin (MULTIVITAMIN) capsule Take 1 capsule by mouth daily.      . ondansetron (ZOFRAN) 8 MG tablet Take 1 tablet two times a day as needed for nausea or vomiting starting on the third day after chemotherapy.  30 tablet  1  . oxyCODONE (OXY IR/ROXICODONE) 5 MG immediate release tablet 1-2 tabs po every 3-4 hours prn pain  90 tablet  0  . prochlorperazine (COMPAZINE) 10 MG tablet Take 1 tablet (10 mg total) by mouth every 6 (six) hours as needed (Nausea or vomiting).  30 tablet  1  . TRAZODONE HCL PO Take by mouth as needed.      . zolpidem (AMBIEN) 5 MG tablet Take 1 tablet (5 mg total) by mouth at bedtime as needed.  30 tablet  0    Allergies:  Allergies  Allergen Reactions  . Codeine Nausea Only    Physical Exam: Filed Vitals:   10/08/11 0936  BP: 115/72  Pulse: 89  Temp: 98.3 F (36.8 C)    Body mass index is 24.53 kg/(m^2). Weight: 156 lbs. HEENT:  Sclerae anicteric, conjunctivae pink.  Oropharynx clear.  No mucositis or candidiasis.   Nodes:  No cervical, supraclavicular, or axillary lymphadenopathy palpated.  Breast Exam:  I cannot appreciate any masses in the right breast there may be a slight firm area of firmness in the lateral portion of the right breast approximately the 8:00 position. I could not appreciate any axillary adenopathy.  Lungs:  Clear to auscultation bilaterally.  No crackles, rhonchi, or wheezes.   Heart:   Regular rate and rhythm.   Abdomen:  Soft, nontender.  Positive bowel sounds.  No organomegaly or masses palpated.   Musculoskeletal:  No focal spinal tenderness to palpation.  Extremities:  Benign.  No peripheral edema or cyanosis.   Skin:  Benign.   Neuro:  Nonfocal, alert and oriented x 3.   Lab Results: 09/25/11: WBC: 5.8 ANC: 4.7 HBG: 9.4 PLTC: 119    Chemistry      Component Value Date/Time   NA 141 10/02/2011 1340   K 3.8 10/02/2011 1340   CL 105 10/02/2011 1340   CO2 26 10/02/2011 1340   BUN 11 10/02/2011 1340   CREATININE 0.70 10/02/2011 1340      Component Value Date/Time   CALCIUM 9.2 10/02/2011 1340   ALKPHOS 75 10/02/2011 1340   AST 23 10/02/2011 1340   ALT 25 10/02/2011 1340   BILITOT 0.3 10/02/2011 1340      Lab Results  Component Value Date   LABCA2 19 07/17/2011    Radiological Studies: Dg Chest Port 1 View 07/22/2011  *RADIOLOGY REPORT*  Clinical Data: Port-A-Cath placement  PORTABLE CHEST - 1 VIEW  Comparison: None.  Findings: 0850 hours.  Left-sided Port-A-Cath is noted with distal tip positioned at the distal SVC level.  No evidence for left-sided pneumothorax.  No edema or focal airspace consolidation. Cardiopericardial silhouette is at upper limits of normal for size. Imaged bony structures of the thorax are intact.  IMPRESSION: The tip of the left Port-A-Cath is positioned at the level of the distal SVC.  No evidence for pneumothorax.  Original Report Authenticated By: ERIC A. MANSELL, M.D.    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 She is currently day 7 cycle 4/4 planned neoadjuvant dose dense taxotere     Plan:  Kelly Mills is doing well.  Her counts are good and she will return in 1 week for c2.   She knows to call with any changes or problems.    Kelly Printy,MD 10/08/11

## 2011-10-15 ENCOUNTER — Other Ambulatory Visit (HOSPITAL_BASED_OUTPATIENT_CLINIC_OR_DEPARTMENT_OTHER): Payer: BC Managed Care – PPO | Admitting: Lab

## 2011-10-15 ENCOUNTER — Ambulatory Visit (HOSPITAL_BASED_OUTPATIENT_CLINIC_OR_DEPARTMENT_OTHER): Payer: BC Managed Care – PPO | Admitting: Physician Assistant

## 2011-10-15 VITALS — BP 132/86 | HR 83 | Temp 98.6°F | Ht 67.0 in | Wt 156.8 lb

## 2011-10-15 DIAGNOSIS — Z170 Estrogen receptor positive status [ER+]: Secondary | ICD-10-CM

## 2011-10-15 DIAGNOSIS — C50419 Malignant neoplasm of upper-outer quadrant of unspecified female breast: Secondary | ICD-10-CM

## 2011-10-15 LAB — CBC WITH DIFFERENTIAL/PLATELET
Basophils Absolute: 0.2 10*3/uL — ABNORMAL HIGH (ref 0.0–0.1)
Eosinophils Absolute: 0 10*3/uL (ref 0.0–0.5)
HGB: 9.9 g/dL — ABNORMAL LOW (ref 11.6–15.9)
MCV: 101.7 fL — ABNORMAL HIGH (ref 79.5–101.0)
MONO#: 0.9 10*3/uL (ref 0.1–0.9)
MONO%: 1.5 % (ref 0.0–14.0)
NEUT#: 55.2 10*3/uL — ABNORMAL HIGH (ref 1.5–6.5)
RDW: 19.5 % — ABNORMAL HIGH (ref 11.2–14.5)
WBC: 57.6 10*3/uL (ref 3.9–10.3)
lymph#: 1.3 10*3/uL (ref 0.9–3.3)

## 2011-10-15 LAB — COMPREHENSIVE METABOLIC PANEL
AST: 25 U/L (ref 0–37)
Alkaline Phosphatase: 132 U/L — ABNORMAL HIGH (ref 39–117)
CO2: 25 mEq/L (ref 19–32)
Chloride: 104 mEq/L (ref 96–112)
Creatinine, Ser: 0.73 mg/dL (ref 0.50–1.10)
Glucose, Bld: 146 mg/dL — ABNORMAL HIGH (ref 70–99)
Potassium: 3.7 mEq/L (ref 3.5–5.3)
Sodium: 139 mEq/L (ref 135–145)
Total Bilirubin: 0.4 mg/dL (ref 0.3–1.2)

## 2011-10-16 ENCOUNTER — Other Ambulatory Visit: Payer: Self-pay | Admitting: Oncology

## 2011-10-16 ENCOUNTER — Ambulatory Visit (HOSPITAL_BASED_OUTPATIENT_CLINIC_OR_DEPARTMENT_OTHER): Payer: BC Managed Care – PPO

## 2011-10-16 VITALS — BP 132/88 | HR 76 | Temp 97.9°F

## 2011-10-16 DIAGNOSIS — Z5111 Encounter for antineoplastic chemotherapy: Secondary | ICD-10-CM

## 2011-10-16 DIAGNOSIS — C50419 Malignant neoplasm of upper-outer quadrant of unspecified female breast: Secondary | ICD-10-CM

## 2011-10-16 MED ORDER — ONDANSETRON 8 MG/50ML IVPB (CHCC)
8.0000 mg | Freq: Once | INTRAVENOUS | Status: AC
  Administered 2011-10-16: 8 mg via INTRAVENOUS

## 2011-10-16 MED ORDER — HEPARIN SOD (PORK) LOCK FLUSH 100 UNIT/ML IV SOLN
500.0000 [IU] | Freq: Once | INTRAVENOUS | Status: AC | PRN
  Administered 2011-10-16: 500 [IU]
  Filled 2011-10-16: qty 5

## 2011-10-16 MED ORDER — DOCETAXEL CHEMO INJECTION 160 MG/16ML
75.0000 mg/m2 | Freq: Once | INTRAVENOUS | Status: AC
  Administered 2011-10-16: 130 mg via INTRAVENOUS
  Filled 2011-10-16: qty 13

## 2011-10-16 MED ORDER — DEXAMETHASONE SODIUM PHOSPHATE 10 MG/ML IJ SOLN
10.0000 mg | Freq: Once | INTRAMUSCULAR | Status: AC
  Administered 2011-10-16: 10 mg via INTRAVENOUS

## 2011-10-16 MED ORDER — SODIUM CHLORIDE 0.9 % IJ SOLN
10.0000 mL | INTRAMUSCULAR | Status: DC | PRN
  Administered 2011-10-16: 10 mL
  Filled 2011-10-16: qty 10

## 2011-10-16 MED ORDER — SODIUM CHLORIDE 0.9 % IV SOLN
Freq: Once | INTRAVENOUS | Status: AC
  Administered 2011-10-16: 14:00:00 via INTRAVENOUS

## 2011-10-16 NOTE — Patient Instructions (Signed)
[facility] Discharge Instructions for Patients Receiving Chemotherapy  Today you received the following chemotherapy agents: Taxotere  To help prevent nausea and vomiting after your treatment, we encourage you to take your nausea medication as directed by your MD. If you develop nausea and vomiting that is not controlled by your nausea medication, call the clinic. If it is after clinic hours your family physician or the after hours number for the clinic or go to the Emergency Department.   BELOW ARE SYMPTOMS THAT SHOULD BE REPORTED IMMEDIATELY:  *FEVER GREATER THAN 100.5 F  *CHILLS WITH OR WITHOUT FEVER  NAUSEA AND VOMITING THAT IS NOT CONTROLLED WITH YOUR NAUSEA MEDICATION  *UNUSUAL SHORTNESS OF BREATH  *UNUSUAL BRUISING OR BLEEDING  TENDERNESS IN MOUTH AND THROAT WITH OR WITHOUT PRESENCE OF ULCERS  *URINARY PROBLEMS  *BOWEL PROBLEMS  UNUSUAL RASH Items with * indicate a potential emergency and should be followed up as soon as possible.  Feel free to call the clinic you have any questions or concerns. The clinic phone number is [phone].    

## 2011-10-16 NOTE — Progress Notes (Signed)
Pt refused ativan today.

## 2011-10-17 ENCOUNTER — Telehealth: Payer: Self-pay | Admitting: *Deleted

## 2011-10-17 ENCOUNTER — Ambulatory Visit (HOSPITAL_BASED_OUTPATIENT_CLINIC_OR_DEPARTMENT_OTHER): Payer: BC Managed Care – PPO

## 2011-10-17 VITALS — BP 126/83 | HR 69 | Temp 97.2°F

## 2011-10-17 DIAGNOSIS — Z5189 Encounter for other specified aftercare: Secondary | ICD-10-CM

## 2011-10-17 DIAGNOSIS — C50419 Malignant neoplasm of upper-outer quadrant of unspecified female breast: Secondary | ICD-10-CM

## 2011-10-17 MED ORDER — PEGFILGRASTIM INJECTION 6 MG/0.6ML
6.0000 mg | Freq: Once | SUBCUTANEOUS | Status: AC
  Administered 2011-10-17: 6 mg via SUBCUTANEOUS
  Filled 2011-10-17: qty 0.6

## 2011-10-17 NOTE — Progress Notes (Signed)
Hematology and Oncology Follow Up Visit  Kelly Mills [id number] [birth year] 41 y.o. 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 She is has completed 4/4 planned cycles of neoadjuvant dose dense FEC with Neulasta support on day 2, due for day 1, cycle 2/4 adjuvant dose dense Taxotere on 10/16/2011.   Interim History:   Kelly Mills returns today for followup in anticipation of day 1 cycle 2 of 4 planned neoadjuvant dose dense Taxotere to be given on 10/16/2011, with Neulasta support on day 2. She is feeling well. She denies any difficulty with fevers, chills, night sweats. She denies any nausea, emesis, diarrhea or constipation issues. She denies a diffuse bone pain.  She has noted a bit more fatigue this go around, she has also had a bit of difficulty with heartburn which is now resolved.  She denies any excessive eye tearing, peripheral neuropathy symptoms, skin changes, or nailbed dyscrasia. A detailed review of systems is otherwise noncontributory as noted below.  Review of Systems: Constitutional:  no weight loss, fever, night sweats and feels well Eyes: No complaints ENT: No complaints Cardiovascular: no chest pain or dyspnea on exertion Respiratory: no cough, shortness of breath, or wheezing Neurological: no TIA or stroke symptoms Dermatological: negative Gastrointestinal: no abdominal pain, change in bowel habits, or black or bloody stools Genito-Urinary: no dysuria, trouble voiding, or hematuria Hematological and Lymphatic: negative Breast: negative Musculoskeletal: Occasional muscle pain just lateral to the spine in the upper right back region, known area for "knot". Remaining ROS negative.  Medications:   I have reviewed the patient's current medications.  Current Outpatient Prescriptions  Medication Sig Dispense Refill  .  dexamethasone (DECADRON) 4 MG tablet Take 2 tabs two times a day the day before Taxotere chemo. Then take 2 tabs daily starting the day after chemo for 2 days.  30 tablet  1  . lidocaine-prilocaine (EMLA) cream Ad lib.      Marland Kitchen LORazepam (ATIVAN) 0.5 MG tablet Take 1 tablet (0.5 mg total) by mouth every 6 (six) hours as needed (Nausea or vomiting).  30 tablet  0  . Multiple Vitamin (MULTIVITAMIN) capsule Take 1 capsule by mouth daily.      . ondansetron (ZOFRAN) 8 MG tablet Take 1 tablet two times a day as needed for nausea or vomiting starting on the third day after chemotherapy.  30 tablet  1  . oxyCODONE (OXY IR/ROXICODONE) 5 MG immediate release tablet 1-2 tabs po every 3-4 hours prn pain  90 tablet  0  . prochlorperazine (COMPAZINE) 10 MG tablet Take 1 tablet (10 mg total) by mouth every 6 (six) hours as needed (Nausea or vomiting).  30 tablet  1  . TRAZODONE HCL PO Take by mouth as needed.      . zolpidem (AMBIEN) 5 MG tablet Take 1 tablet (5 mg total) by mouth at bedtime as needed.  30 tablet  0   No current facility-administered medications for this visit.   Facility-Administered Medications Ordered in Other Visits  Medication Dose Route Frequency Provider Last Rate Last Dose  . 0.9 %  sodium chloride infusion   Intravenous Once Amada Kingfisher, PA      . DOCEtaxel (TAXOTERE) 130 mg in dextrose 5 % 250 mL chemo infusion  75 mg/m2 (Treatment Plan Actual) Intravenous Once Amada Kingfisher, PA   130 mg at 10/16/11 1443  .  heparin lock flush 100 unit/mL  500 Units Intracatheter Once PRN Amada Kingfisher, PA   500 Units at 10/16/11 1552  . pegfilgrastim (NEULASTA) injection 6 mg  6 mg Subcutaneous Once Amada Kingfisher, PA   6 mg at 10/17/11 1309  . DISCONTD: sodium chloride 0.9 % injection 10 mL  10 mL Intracatheter PRN Amada Kingfisher, PA   10 mL at 10/16/11 1552    Allergies:  Allergies  Allergen Reactions  . Codeine Nausea Only    Physical Exam: Filed Vitals:    10/15/11 1415  BP: 132/86  Pulse: 83  Temp: 98.6 F (37 C)    Body mass index is 24.56 kg/(m^2). Weight: 156 lbs. HEENT:  Sclerae anicteric, conjunctivae pink.  Oropharynx clear.  No mucositis or candidiasis.   Nodes:  No cervical, supraclavicular, or axillary lymphadenopathy palpated.  Breast Exam:  Deferred. Lungs:  Clear to auscultation bilaterally.  No crackles, rhonchi, or wheezes.   Heart:  Regular rate and rhythm.   Abdomen:  Soft, nontender.  Positive bowel sounds.  No organomegaly or masses palpated.   Musculoskeletal:  No focal spinal tenderness to palpation.  Extremities:  Benign.  No peripheral edema or cyanosis.   Skin:  Benign.   Neuro:  Nonfocal, alert and oriented x 3.   Lab Results: 10/14/11 WBC: 57.6  ANC: 55.2 HBG: 9.9 PLTC: 173    Chemistry      Component Value Date/Time   NA 139 10/15/2011 1338   K 3.7 10/15/2011 1338   CL 104 10/15/2011 1338   CO2 25 10/15/2011 1338   BUN 13 10/15/2011 1338   CREATININE 0.73 10/15/2011 1338      Component Value Date/Time   CALCIUM 9.2 10/15/2011 1338   ALKPHOS 132* 10/15/2011 1338   AST 25 10/15/2011 1338   ALT 54* 10/15/2011 1338   BILITOT 0.4 10/15/2011 1338      Lab Results  Component Value Date   LABCA2 34 10/08/2011    Radiological Studies: Dg Chest Port 1 View 07/22/2011  *RADIOLOGY REPORT*  Clinical Data: Port-A-Cath placement  PORTABLE CHEST - 1 VIEW  Comparison: None.  Findings: 0850 hours.  Left-sided Port-A-Cath is noted with distal tip positioned at the distal SVC level.  No evidence for left-sided pneumothorax.  No edema or focal airspace consolidation. Cardiopericardial silhouette is at upper limits of normal for size. Imaged bony structures of the thorax are intact.  IMPRESSION: The tip of the left Port-A-Cath is positioned at the level of the distal SVC.  No evidence for pneumothorax.  Original Report Authenticated By: ERIC A. MANSELL, M.D.    Assessment:  Kelly Mills is a 41 year old [place] woman  with a newly diagnosed ER/PR positive at 98/80% respectively, 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 She is has completed 4/4 planned cycles of neoadjuvant dose dense FEC with Neulasta support on day 2, due for day 1, cycle 2/4 adjuvant dose dense Taxotere on 10/16/2011.  2. Leukocytosis secondary to delayed Neulasta effect and dexamethasone therapy.  Case reviewed with Dr. Pierce Crane.   Plan:  Teea is doing well.  Despite the fact that her white count is elevated, she will proceed for day 1 cycle 2 of 4 planned neoadjuvant dose dense Taxotere on 10/16/2011, we will utilize Neulasta on day 2. Since she will be at town next week, we will regroup in 2 weeks' time prior to day 1 cycle 3 of her neoadjuvant therapy. This plan was reviewed with the  patient, who voices understanding and agreement.  She knows to call with any changes or problems.    Johnathon Mittal T, PA-C 10/14/11

## 2011-10-17 NOTE — Telephone Encounter (Signed)
Per staff message from [place], I have scheduled treatment apps.  JMW

## 2011-10-23 ENCOUNTER — Other Ambulatory Visit: Payer: BC Managed Care – PPO | Admitting: Lab

## 2011-10-30 ENCOUNTER — Encounter: Payer: Self-pay | Admitting: Physician Assistant

## 2011-10-30 ENCOUNTER — Ambulatory Visit (HOSPITAL_BASED_OUTPATIENT_CLINIC_OR_DEPARTMENT_OTHER): Payer: BC Managed Care – PPO

## 2011-10-30 ENCOUNTER — Telehealth: Payer: Self-pay | Admitting: Oncology

## 2011-10-30 ENCOUNTER — Other Ambulatory Visit (HOSPITAL_BASED_OUTPATIENT_CLINIC_OR_DEPARTMENT_OTHER): Payer: BC Managed Care – PPO | Admitting: Lab

## 2011-10-30 ENCOUNTER — Ambulatory Visit (HOSPITAL_BASED_OUTPATIENT_CLINIC_OR_DEPARTMENT_OTHER): Payer: BC Managed Care – PPO | Admitting: Physician Assistant

## 2011-10-30 VITALS — BP 124/73 | HR 83 | Temp 97.9°F | Ht 67.0 in | Wt 159.4 lb

## 2011-10-30 DIAGNOSIS — Z170 Estrogen receptor positive status [ER+]: Secondary | ICD-10-CM

## 2011-10-30 DIAGNOSIS — C50419 Malignant neoplasm of upper-outer quadrant of unspecified female breast: Secondary | ICD-10-CM

## 2011-10-30 DIAGNOSIS — Z5111 Encounter for antineoplastic chemotherapy: Secondary | ICD-10-CM

## 2011-10-30 LAB — CBC WITH DIFFERENTIAL/PLATELET
Eosinophils Absolute: 0.1 10*3/uL (ref 0.0–0.5)
LYMPH%: 11.8 % — ABNORMAL LOW (ref 14.0–49.7)
MCHC: 33.5 g/dL (ref 31.5–36.0)
MCV: 103 fL — ABNORMAL HIGH (ref 79.5–101.0)
MONO%: 7.4 % (ref 0.0–14.0)
NEUT#: 9.1 10*3/uL — ABNORMAL HIGH (ref 1.5–6.5)
Platelets: 148 10*3/uL (ref 145–400)
RBC: 2.82 10*6/uL — ABNORMAL LOW (ref 3.70–5.45)

## 2011-10-30 LAB — COMPREHENSIVE METABOLIC PANEL
Alkaline Phosphatase: 94 U/L (ref 39–117)
Creatinine, Ser: 0.6 mg/dL (ref 0.50–1.10)
Glucose, Bld: 98 mg/dL (ref 70–99)
Sodium: 140 mEq/L (ref 135–145)
Total Bilirubin: 0.4 mg/dL (ref 0.3–1.2)
Total Protein: 6.1 g/dL (ref 6.0–8.3)

## 2011-10-30 MED ORDER — SODIUM CHLORIDE 0.9 % IJ SOLN
10.0000 mL | INTRAMUSCULAR | Status: DC | PRN
  Administered 2011-10-30: 10 mL
  Filled 2011-10-30: qty 10

## 2011-10-30 MED ORDER — ONDANSETRON 8 MG/50ML IVPB (CHCC)
8.0000 mg | Freq: Once | INTRAVENOUS | Status: AC
  Administered 2011-10-30: 8 mg via INTRAVENOUS

## 2011-10-30 MED ORDER — HEPARIN SOD (PORK) LOCK FLUSH 100 UNIT/ML IV SOLN
500.0000 [IU] | Freq: Once | INTRAVENOUS | Status: AC | PRN
  Administered 2011-10-30: 500 [IU]
  Filled 2011-10-30: qty 5

## 2011-10-30 MED ORDER — DOCETAXEL CHEMO INJECTION 160 MG/16ML
75.0000 mg/m2 | Freq: Once | INTRAVENOUS | Status: AC
  Administered 2011-10-30: 130 mg via INTRAVENOUS
  Filled 2011-10-30: qty 13

## 2011-10-30 MED ORDER — DEXAMETHASONE SODIUM PHOSPHATE 10 MG/ML IJ SOLN
10.0000 mg | Freq: Once | INTRAMUSCULAR | Status: AC
  Administered 2011-10-30: 10 mg via INTRAVENOUS

## 2011-10-30 MED ORDER — SODIUM CHLORIDE 0.9 % IV SOLN
Freq: Once | INTRAVENOUS | Status: AC
  Administered 2011-10-30: 12:00:00 via INTRAVENOUS

## 2011-10-30 NOTE — Patient Instructions (Signed)
[facility] Discharge Instructions for Patients Receiving Chemotherapy  Today you received the following chemotherapy agents Taxotere To help prevent nausea and vomiting after your treatment, we encourage you to take your nausea medication as prescribed.  If you develop nausea and vomiting that is not controlled by your nausea medication, call the clinic. If it is after clinic hours your family physician or the after hours number for the clinic or go to the Emergency Department.   BELOW ARE SYMPTOMS THAT SHOULD BE REPORTED IMMEDIATELY:  *FEVER GREATER THAN 100.5 F  *CHILLS WITH OR WITHOUT FEVER  NAUSEA AND VOMITING THAT IS NOT CONTROLLED WITH YOUR NAUSEA MEDICATION  *UNUSUAL SHORTNESS OF BREATH  *UNUSUAL BRUISING OR BLEEDING  TENDERNESS IN MOUTH AND THROAT WITH OR WITHOUT PRESENCE OF ULCERS  *URINARY PROBLEMS  *BOWEL PROBLEMS  UNUSUAL RASH Items with * indicate a potential emergency and should be followed up as soon as possible.  One of the nurses will contact you 24 hours after your treatment. Please let the nurse know about any problems that you may have experienced. Feel free to call the clinic you have any questions or concerns. The clinic phone number is [phone].   I have been informed and understand all the instructions given to me. I know to contact the clinic, my physician, or go to the Emergency Department if any problems should occur. I do not have any questions at this time, but understand that I may call the clinic during office hours   should I have any questions or need assistance in obtaining follow up care.    __________________________________________  _____________  __________ Signature of Patient or Authorized Representative            Date                   Time    __________________________________________ Nurse's Signature    

## 2011-10-30 NOTE — Progress Notes (Signed)
Hematology and Oncology Follow Up Visit  Kelly Mills [id number] Nov 23, 1970 41 y.o. 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 She is has completed 4/4 planned cycles of neoadjuvant dose dense FEC with Neulasta support on day 2, due for day 1, cycle 3/4 adjuvant dose dense Taxotere on 10/16/2011.   Interim History:   Kelly Mills returns today for followup in anticipation of day 1 cycle 3 of 4 planned neoadjuvant dose dense Taxotere today, with Neulasta support on day 2. She is feeling well. She denies any difficulty with fevers, chills, night sweats. She denies any nausea, emesis, diarrhea or constipation issues. She denies a diffuse bone pain.  She has noted a bit more fatigue.  She denies any excessive eye tearing, except first thing in themorning, it then clears. No frank peripheral neuropathy symptoms or skin changes, but she has noted some nailbed sensitivity. A detailed review of systems is otherwise noncontributory as noted below.  Review of Systems: Constitutional:  no weight loss, fever, night sweats and feels well Eyes: No complaints ENT: No complaints Cardiovascular: no chest pain or dyspnea on exertion Respiratory: no cough, shortness of breath, or wheezing Neurological: no TIA or stroke symptoms Dermatological: negative Gastrointestinal: no abdominal pain, change in bowel habits, or black or bloody stools Genito-Urinary: no dysuria, trouble voiding, or hematuria Hematological and Lymphatic: negative Breast: negative Musculoskeletal: Occasional muscle pain just lateral to the spine in the upper right back region, known area for "knot". Remaining ROS negative.  Medications:   I have reviewed the patient's current medications.  Current Outpatient Prescriptions  Medication Sig Dispense Refill  . dexamethasone (DECADRON) 4 MG  tablet Take 2 tabs two times a day the day before Taxotere chemo. Then take 2 tabs daily starting the day after chemo for 2 days.  30 tablet  1  . lidocaine-prilocaine (EMLA) cream Ad lib.      Marland Kitchen LORazepam (ATIVAN) 0.5 MG tablet Take 1 tablet (0.5 mg total) by mouth every 6 (six) hours as needed (Nausea or vomiting).  30 tablet  0  . Multiple Vitamin (MULTIVITAMIN) capsule Take 1 capsule by mouth daily.      . ondansetron (ZOFRAN) 8 MG tablet Take 1 tablet two times a day as needed for nausea or vomiting starting on the third day after chemotherapy.  30 tablet  1  . oxyCODONE (OXY IR/ROXICODONE) 5 MG immediate release tablet 1-2 tabs po every 3-4 hours prn pain  90 tablet  0  . prochlorperazine (COMPAZINE) 10 MG tablet Take 1 tablet (10 mg total) by mouth every 6 (six) hours as needed (Nausea or vomiting).  30 tablet  1  . TRAZODONE HCL PO Take by mouth as needed.      . zolpidem (AMBIEN) 5 MG tablet Take 1 tablet (5 mg total) by mouth at bedtime as needed.  30 tablet  0    Allergies:  Allergies  Allergen Reactions  . Codeine Nausea Only    Physical Exam: Filed Vitals:   10/30/11 1109  BP: 124/73  Pulse: 83  Temp: 97.9 F (36.6 C)    Body mass index is 24.97 kg/(m^2). Weight: 159 lbs. HEENT:  Sclerae anicteric, conjunctivae pink.  Oropharynx clear.  No mucositis or candidiasis.   Nodes:  No cervical, supraclavicular, or axillary lymphadenopathy palpated.  Breast Exam: In the right breast has a bit of thickening in the  upper-outer quadrant but no definable mass per se. No nipple inversion or discharge. The axilla is clear. Lungs:  Clear to auscultation bilaterally.  No crackles, rhonchi, or wheezes.   Heart:  Regular rate and rhythm.   Abdomen:  Soft, nontender.  Positive bowel sounds.  No organomegaly or masses palpated.   Musculoskeletal:  No focal spinal tenderness to palpation.  Extremities:  Benign.  No peripheral edema or cyanosis.  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   Skin:  Benign.   Neuro:  Nonfocal, alert and oriented x 3.   Lab Results: 10/14/11 WBC: 57.6  ANC: 55.2 HBG: 9.9 PLTC: 173    Chemistry      Component Value Date/Time   NA 139 10/15/2011 1338   K 3.7 10/15/2011 1338   CL 104 10/15/2011 1338   CO2 25 10/15/2011 1338   BUN 13 10/15/2011 1338   CREATININE 0.73 10/15/2011 1338      Component Value Date/Time   CALCIUM 9.2 10/15/2011 1338   ALKPHOS 132* 10/15/2011 1338   AST 25 10/15/2011 1338   ALT 54* 10/15/2011 1338   BILITOT 0.4 10/15/2011 1338      Lab Results  Component Value Date   LABCA2 34 10/08/2011    Radiological Studies: Dg Chest Port 1 View 07/22/2011  *RADIOLOGY REPORT*  Clinical Data: Port-A-Cath placement  PORTABLE CHEST - 1 VIEW  Comparison: None.  Findings: 0850 hours.  Left-sided Port-A-Cath is noted with distal tip positioned at the distal SVC level.  No evidence for left-sided pneumothorax.  No edema or focal airspace consolidation. Cardiopericardial silhouette is at upper limits of normal for size. Imaged bony structures of the thorax are intact.  IMPRESSION: The tip of the left Port-A-Cath is positioned at the level of the distal SVC.  No evidence for pneumothorax.  Original Report Authenticated By: ERIC A. MANSELL, M.D.    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 She is has completed 4/4 planned cycles of neoadjuvant dose dense FEC with Neulasta support on day 2, due for day 1, cycle 3/4 adjuvant dose dense Taxotere on 10/16/2011.  2. Mild nail bed changes secondary to Taxotere based therapy.  Case reviewed with Dr. Pierce Crane.   Plan:  Kelly Mills is doing well, she will proceed for day 1 cycle 3 of 4 planned neoadjuvant dose dense Taxotere today, and we will utilize Neulasta on day 2. I will see her in 1 weeks time for nadir assessment.   She will utilize vinegar soaks for her mild nailbed dyscrasia. This plan was reviewed with the patient, who voices understanding and agreement.  She knows to call with any changes or problems.    Laveyah Oriol T, PA-C 10/30/11

## 2011-10-30 NOTE — Telephone Encounter (Signed)
gve the pt her June 2013 appt calendar 

## 2011-10-31 ENCOUNTER — Ambulatory Visit (HOSPITAL_BASED_OUTPATIENT_CLINIC_OR_DEPARTMENT_OTHER): Payer: BC Managed Care – PPO

## 2011-10-31 ENCOUNTER — Other Ambulatory Visit: Payer: BC Managed Care – PPO | Admitting: Lab

## 2011-10-31 VITALS — BP 128/82 | HR 87 | Temp 98.0°F

## 2011-10-31 DIAGNOSIS — C50419 Malignant neoplasm of upper-outer quadrant of unspecified female breast: Secondary | ICD-10-CM

## 2011-10-31 DIAGNOSIS — Z170 Estrogen receptor positive status [ER+]: Secondary | ICD-10-CM

## 2011-10-31 DIAGNOSIS — Z5189 Encounter for other specified aftercare: Secondary | ICD-10-CM

## 2011-10-31 MED ORDER — PEGFILGRASTIM INJECTION 6 MG/0.6ML
6.0000 mg | Freq: Once | SUBCUTANEOUS | Status: AC
  Administered 2011-10-31: 6 mg via SUBCUTANEOUS
  Filled 2011-10-31: qty 0.6

## 2011-11-06 ENCOUNTER — Other Ambulatory Visit (HOSPITAL_BASED_OUTPATIENT_CLINIC_OR_DEPARTMENT_OTHER): Payer: BC Managed Care – PPO | Admitting: Lab

## 2011-11-06 ENCOUNTER — Ambulatory Visit (HOSPITAL_BASED_OUTPATIENT_CLINIC_OR_DEPARTMENT_OTHER): Payer: BC Managed Care – PPO | Admitting: Physician Assistant

## 2011-11-06 VITALS — BP 117/76 | HR 76 | Temp 98.3°F | Ht 67.0 in | Wt 160.9 lb

## 2011-11-06 DIAGNOSIS — Z170 Estrogen receptor positive status [ER+]: Secondary | ICD-10-CM

## 2011-11-06 DIAGNOSIS — C50911 Malignant neoplasm of unspecified site of right female breast: Secondary | ICD-10-CM

## 2011-11-06 DIAGNOSIS — C50419 Malignant neoplasm of upper-outer quadrant of unspecified female breast: Secondary | ICD-10-CM

## 2011-11-06 LAB — CBC WITH DIFFERENTIAL/PLATELET
Basophils Absolute: 0.1 10*3/uL (ref 0.0–0.1)
Eosinophils Absolute: 0.1 10*3/uL (ref 0.0–0.5)
HGB: 8.7 g/dL — ABNORMAL LOW (ref 11.6–15.9)
MONO#: 2.4 10*3/uL — ABNORMAL HIGH (ref 0.1–0.9)
NEUT#: 16.5 10*3/uL — ABNORMAL HIGH (ref 1.5–6.5)
RBC: 2.56 10*6/uL — ABNORMAL LOW (ref 3.70–5.45)
RDW: 17.4 % — ABNORMAL HIGH (ref 11.2–14.5)
WBC: 21.2 10*3/uL — ABNORMAL HIGH (ref 3.9–10.3)
lymph#: 2.2 10*3/uL (ref 0.9–3.3)
nRBC: 0 % (ref 0–0)

## 2011-11-06 NOTE — Progress Notes (Signed)
Hematology and Oncology Follow Up Visit  Kelly Mills [id number] Jan 27, 1971 41 y.o. 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 She is has completed 4/4 planned cycles of neoadjuvant dose dense FEC with Neulasta support on day 2, currently day 7, cycle 3/4 adjuvant dose dense Taxotere on 10/16/2011.   Interim History:   Kelly Mills returns today for followup after cycle 3 of 4 planned neoadjuvant dose dense Taxotere today, with Neulasta support on day 2. She is feeling well. She denies any difficulty with fevers, chills, night sweats. She denies any nausea, emesis, diarrhea or constipation issues. She denies a diffuse bone pain.  She has noted a bit more fatigue.  She denies any excessive eye tearing, except first thing in themorning, it then clears.  She has noted some nailbed sensitivity, and mild "tingling" sensation of her finger tips and toes. A detailed review of systems is otherwise noncontributory as noted below.  Review of Systems: Constitutional:  no weight loss, fever, night sweats and feels well Eyes: No complaints ENT: No complaints Cardiovascular: no chest pain or dyspnea on exertion Respiratory: no cough, shortness of breath, or wheezing Neurological: no TIA or stroke symptoms Dermatological: negative Gastrointestinal: no abdominal pain, change in bowel habits, or black or bloody stools Genito-Urinary: no dysuria, trouble voiding, or hematuria Hematological and Lymphatic: negative Breast: negative Musculoskeletal: Occasional muscle pain just lateral to the spine in the upper right back region, known area for "knot". Remaining ROS negative.  Medications:   I have reviewed the patient's current medications.  Current Outpatient Prescriptions  Medication Sig Dispense Refill  . dexamethasone (DECADRON) 4 MG tablet Take 2 tabs  two times a day the day before Taxotere chemo. Then take 2 tabs daily starting the day after chemo for 2 days.  30 tablet  1  . lidocaine-prilocaine (EMLA) cream Ad lib.      Marland Kitchen LORazepam (ATIVAN) 0.5 MG tablet Take 1 tablet (0.5 mg total) by mouth every 6 (six) hours as needed (Nausea or vomiting).  30 tablet  0  . Multiple Vitamin (MULTIVITAMIN) capsule Take 1 capsule by mouth daily.      . ondansetron (ZOFRAN) 8 MG tablet Take 1 tablet two times a day as needed for nausea or vomiting starting on the third day after chemotherapy.  30 tablet  1  . oxyCODONE (OXY IR/ROXICODONE) 5 MG immediate release tablet 1-2 tabs po every 3-4 hours prn pain  90 tablet  0  . prochlorperazine (COMPAZINE) 10 MG tablet Take 1 tablet (10 mg total) by mouth every 6 (six) hours as needed (Nausea or vomiting).  30 tablet  1  . TRAZODONE HCL PO Take by mouth as needed.      . zolpidem (AMBIEN) 5 MG tablet Take 1 tablet (5 mg total) by mouth at bedtime as needed.  30 tablet  0    Allergies:  Allergies  Allergen Reactions  . Codeine Nausea Only    Physical Exam: Filed Vitals:   11/06/11 1143  BP: 117/76  Pulse: 76  Temp: 98.3 F (36.8 C)    Body mass index is 25.20 kg/(m^2). Weight: 160 lbs. HEENT:  Sclerae anicteric, conjunctivae pink.  Oropharynx clear.  No mucositis or candidiasis.   Nodes:  No cervical, supraclavicular, or axillary lymphadenopathy palpated.  Breast Exam: Deferred. Lungs:  Clear to auscultation bilaterally.  No crackles, rhonchi, or wheezes.  Heart:  Regular rate and rhythm.   Abdomen:  Soft, nontender.  Positive bowel sounds.  No organomegaly or masses palpated.   Musculoskeletal:  No focal spinal tenderness to palpation.  Extremities:  Benign.  No peripheral edema or cyanosis.  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   Skin:  Benign.   Neuro:  Nonfocal, alert and oriented x 3.   Lab Results: 10/14/11 WBC: 57.6  ANC: 55.2 HBG: 9.9 PLTC:  173    Chemistry      Component Value Date/Time   NA 140 10/30/2011 1102   K 4.3 10/30/2011 1102   CL 103 10/30/2011 1102   CO2 26 10/30/2011 1102   BUN 15 10/30/2011 1102   CREATININE 0.60 10/30/2011 1102      Component Value Date/Time   CALCIUM 8.7 10/30/2011 1102   ALKPHOS 94 10/30/2011 1102   AST 19 10/30/2011 1102   ALT 29 10/30/2011 1102   BILITOT 0.4 10/30/2011 1102      Lab Results  Component Value Date   LABCA2 34 10/08/2011    Radiological Studies: Dg Chest Port 1 View 07/22/2011  *RADIOLOGY REPORT*  Clinical Data: Port-A-Cath placement  PORTABLE CHEST - 1 VIEW  Comparison: None.  Findings: 0850 hours.  Left-sided Port-A-Cath is noted with distal tip positioned at the distal SVC level.  No evidence for left-sided pneumothorax.  No edema or focal airspace consolidation. Cardiopericardial silhouette is at upper limits of normal for size. Imaged bony structures of the thorax are intact.  IMPRESSION: The tip of the left Port-A-Cath is positioned at the level of the distal SVC.  No evidence for pneumothorax.  Original Report Authenticated By: ERIC A. MANSELL, M.D.    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 She is has completed 4/4 planned cycles of neoadjuvant dose dense FEC with Neulasta support on day 2, currently day 7, cycle 3/4 adjuvant dose dense Taxotere on 10/16/2011.  2. Mild nail bed changes secondary to Taxotere based therapy.  Case reviewed with Dr. Pierce Crane.   Plan:  Kelly Mills is doing well,  I will see her in 1 weeks time prior to day 1 cycle 4/4 neoadjuvant dose dense Taxotere.  This plan was reviewed with the patient, who voices understanding and agreement.  She knows to call with any changes or problems.    Damani Kelemen T, PA-C 11/06/11

## 2011-11-12 ENCOUNTER — Other Ambulatory Visit: Payer: Self-pay | Admitting: Physician Assistant

## 2011-11-12 DIAGNOSIS — C50419 Malignant neoplasm of upper-outer quadrant of unspecified female breast: Secondary | ICD-10-CM

## 2011-11-13 ENCOUNTER — Ambulatory Visit (HOSPITAL_BASED_OUTPATIENT_CLINIC_OR_DEPARTMENT_OTHER): Payer: BC Managed Care – PPO

## 2011-11-13 ENCOUNTER — Other Ambulatory Visit: Payer: Self-pay | Admitting: *Deleted

## 2011-11-13 ENCOUNTER — Ambulatory Visit (HOSPITAL_BASED_OUTPATIENT_CLINIC_OR_DEPARTMENT_OTHER): Payer: BC Managed Care – PPO | Admitting: Physician Assistant

## 2011-11-13 ENCOUNTER — Other Ambulatory Visit (HOSPITAL_BASED_OUTPATIENT_CLINIC_OR_DEPARTMENT_OTHER): Payer: BC Managed Care – PPO | Admitting: Lab

## 2011-11-13 VITALS — BP 148/76 | HR 87 | Temp 98.4°F | Ht 67.0 in | Wt 158.8 lb

## 2011-11-13 DIAGNOSIS — C50419 Malignant neoplasm of upper-outer quadrant of unspecified female breast: Secondary | ICD-10-CM

## 2011-11-13 DIAGNOSIS — Z170 Estrogen receptor positive status [ER+]: Secondary | ICD-10-CM

## 2011-11-13 DIAGNOSIS — Z5111 Encounter for antineoplastic chemotherapy: Secondary | ICD-10-CM

## 2011-11-13 LAB — CBC WITH DIFFERENTIAL/PLATELET
BASO%: 0.2 % (ref 0.0–2.0)
EOS%: 0.6 % (ref 0.0–7.0)
HCT: 32 % — ABNORMAL LOW (ref 34.8–46.6)
LYMPH%: 10.2 % — ABNORMAL LOW (ref 14.0–49.7)
MCH: 35.3 pg — ABNORMAL HIGH (ref 25.1–34.0)
MCHC: 33.3 g/dL (ref 31.5–36.0)
MCV: 106.1 fL — ABNORMAL HIGH (ref 79.5–101.0)
MONO#: 0.7 10*3/uL (ref 0.1–0.9)
MONO%: 5.7 % (ref 0.0–14.0)
NEUT%: 83.3 % — ABNORMAL HIGH (ref 38.4–76.8)
Platelets: 150 10*3/uL (ref 145–400)
RBC: 3.01 10*6/uL — ABNORMAL LOW (ref 3.70–5.45)

## 2011-11-13 LAB — COMPREHENSIVE METABOLIC PANEL
ALT: 16 U/L (ref 0–35)
Alkaline Phosphatase: 87 U/L (ref 39–117)
CO2: 25 mEq/L (ref 19–32)
Creatinine, Ser: 0.57 mg/dL (ref 0.50–1.10)
Total Bilirubin: 0.3 mg/dL (ref 0.3–1.2)

## 2011-11-13 LAB — LACTATE DEHYDROGENASE: LDH: 277 U/L — ABNORMAL HIGH (ref 94–250)

## 2011-11-13 MED ORDER — SODIUM CHLORIDE 0.9 % IV SOLN
Freq: Once | INTRAVENOUS | Status: AC
  Administered 2011-11-13: 15:00:00 via INTRAVENOUS

## 2011-11-13 MED ORDER — SODIUM CHLORIDE 0.9 % IJ SOLN
10.0000 mL | INTRAMUSCULAR | Status: DC | PRN
  Administered 2011-11-13: 10 mL
  Filled 2011-11-13: qty 10

## 2011-11-13 MED ORDER — ONDANSETRON 8 MG/50ML IVPB (CHCC)
8.0000 mg | Freq: Once | INTRAVENOUS | Status: AC
  Administered 2011-11-13: 8 mg via INTRAVENOUS

## 2011-11-13 MED ORDER — DEXAMETHASONE SODIUM PHOSPHATE 10 MG/ML IJ SOLN
10.0000 mg | Freq: Once | INTRAMUSCULAR | Status: AC
  Administered 2011-11-13: 10 mg via INTRAVENOUS

## 2011-11-13 MED ORDER — HEPARIN SOD (PORK) LOCK FLUSH 100 UNIT/ML IV SOLN
500.0000 [IU] | Freq: Once | INTRAVENOUS | Status: AC | PRN
  Administered 2011-11-13: 500 [IU]
  Filled 2011-11-13: qty 5

## 2011-11-13 MED ORDER — DOCETAXEL CHEMO INJECTION 160 MG/16ML
75.0000 mg/m2 | Freq: Once | INTRAVENOUS | Status: AC
  Administered 2011-11-13: 130 mg via INTRAVENOUS
  Filled 2011-11-13: qty 13

## 2011-11-13 NOTE — Patient Instructions (Addendum)
[facility] Discharge Instructions for Patients Receiving Chemotherapy  Today you received the following chemotherapy agents Taxotere.  To help prevent nausea and vomiting after your treatment, we encourage you to take your nausea medication.  If you develop nausea and vomiting that is not controlled by your nausea medication, call the clinic. If it is after clinic hours your family physician or the after hours number for the clinic or go to the Emergency Department.   BELOW ARE SYMPTOMS THAT SHOULD BE REPORTED IMMEDIATELY:  *FEVER GREATER THAN 100.5 F  *CHILLS WITH OR WITHOUT FEVER  NAUSEA AND VOMITING THAT IS NOT CONTROLLED WITH YOUR NAUSEA MEDICATION  *UNUSUAL SHORTNESS OF BREATH  *UNUSUAL BRUISING OR BLEEDING  TENDERNESS IN MOUTH AND THROAT WITH OR WITHOUT PRESENCE OF ULCERS  *URINARY PROBLEMS  *BOWEL PROBLEMS  UNUSUAL RASH Items with * indicate a potential emergency and should be followed up as soon as possible.  One of the nurses will contact you 24 hours after your treatment. Please let the nurse know about any problems that you may have experienced. Feel free to call the clinic you have any questions or concerns. The clinic phone number is [phone].   I have been informed and understand all the instructions given to me. I know to contact the clinic, my physician, or go to the Emergency Department if any problems should occur. I do not have any questions at this time, but understand that I may call the clinic during office hours   should I have any questions or need assistance in obtaining follow up care.    __________________________________________  _____________  __________ Signature of Patient or Authorized Representative            Date                   Time    __________________________________________ Nurse's Signature    

## 2011-11-13 NOTE — Progress Notes (Signed)
Hematology and Oncology Follow Up Visit  Kelly Mills [id number] 1970/10/08 41 y.o. 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 She is has completed 4/4 planned cycles of neoadjuvant dose dense FEC with Neulasta support on day 2, due for day 1, cycle 4/4 adjuvant dose dense Taxotere on 10/16/2011.   Interim History:   Kelly Mills returns today for followup in anticipation of day 1 cycle 4 of 4 planned neoadjuvant dose dense Taxotere today, with Neulasta support on day 2. She is feeling well. She denies any difficulty with fevers, chills, night sweats. She denies any nausea, emesis, diarrhea or constipation issues. She denies a diffuse bone pain.  She has noted a bit more fatigue.  She has noted abit more eye tearing.  No frank peripheral neuropathy symptoms or skin changes, but she has noted some nailbed sensitivity. A detailed review of systems is otherwise noncontributory as noted below.  Review of Systems: Constitutional:  no weight loss, fever, night sweats and feels well Eyes: No complaints ENT: No complaints Cardiovascular: no chest pain or dyspnea on exertion Respiratory: no cough, shortness of breath, or wheezing Neurological: no TIA or stroke symptoms Dermatological: negative Gastrointestinal: no abdominal pain, change in bowel habits, or black or bloody stools Genito-Urinary: no dysuria, trouble voiding, or hematuria Hematological and Lymphatic: negative Breast: negative Musculoskeletal: Occasional muscle pain just lateral to the spine in the upper right back region, known area for "knot". Remaining ROS negative.  Medications:   I have reviewed the patient's current medications.  Current Outpatient Prescriptions  Medication Sig Dispense Refill  . dexamethasone (DECADRON) 4 MG tablet Take 2 tabs two times a day the day before  Taxotere chemo. Then take 2 tabs daily starting the day after chemo for 2 days.  30 tablet  1  . lidocaine-prilocaine (EMLA) cream Ad lib.      Marland Kitchen LORazepam (ATIVAN) 0.5 MG tablet Take 1 tablet (0.5 mg total) by mouth every 6 (six) hours as needed (Nausea or vomiting).  30 tablet  0  . Multiple Vitamin (MULTIVITAMIN) capsule Take 1 capsule by mouth daily.      . ondansetron (ZOFRAN) 8 MG tablet Take 1 tablet two times a day as needed for nausea or vomiting starting on the third day after chemotherapy.  30 tablet  1  . oxyCODONE (OXY IR/ROXICODONE) 5 MG immediate release tablet 1-2 tabs po every 3-4 hours prn pain  90 tablet  0  . prochlorperazine (COMPAZINE) 10 MG tablet Take 1 tablet (10 mg total) by mouth every 6 (six) hours as needed (Nausea or vomiting).  30 tablet  1  . TRAZODONE HCL PO Take by mouth as needed.      . zolpidem (AMBIEN) 5 MG tablet Take 1 tablet (5 mg total) by mouth at bedtime as needed.  30 tablet  0    Allergies:  Allergies  Allergen Reactions  . Codeine Nausea Only    Physical Exam: Filed Vitals:   11/13/11 1316  BP: 148/76  Pulse: 87  Temp: 98.4 F (36.9 C)    Body mass index is 24.87 kg/(m^2). Weight: 158 lbs. HEENT:  Sclerae anicteric, conjunctivae pink.  Oropharynx clear.  No mucositis or candidiasis.   Nodes:  No cervical, supraclavicular, or axillary lymphadenopathy palpated.  Breast Exam: Deferred. Lungs:  Clear to auscultation bilaterally.  No crackles, rhonchi, or wheezes.   Heart:  Regular rate and rhythm.   Abdomen:  Soft, nontender.  Positive bowel sounds.  No organomegaly or masses palpated.   Musculoskeletal:  No focal spinal tenderness to palpation.  Extremities:  Benign.  No peripheral edema or cyanosis.  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   Skin:  Benign.   Neuro:  Nonfocal, alert and oriented x 3.   Lab Results: 11/13/11 WBC: 12.7  ANC: 10.5 HBG: 10.6 PLTC: 150    Chemistry        Component Value Date/Time   NA 140 10/30/2011 1102   K 4.3 10/30/2011 1102   CL 103 10/30/2011 1102   CO2 26 10/30/2011 1102   BUN 15 10/30/2011 1102   CREATININE 0.60 10/30/2011 1102      Component Value Date/Time   CALCIUM 8.7 10/30/2011 1102   ALKPHOS 94 10/30/2011 1102   AST 19 10/30/2011 1102   ALT 29 10/30/2011 1102   BILITOT 0.4 10/30/2011 1102      Lab Results  Component Value Date   LABCA2 34 10/08/2011    Radiological Studies: Dg Chest Port 1 View 07/22/2011  *RADIOLOGY REPORT*  Clinical Data: Port-A-Cath placement  PORTABLE CHEST - 1 VIEW  Comparison: None.  Findings: 0850 hours.  Left-sided Port-A-Cath is noted with distal tip positioned at the distal SVC level.  No evidence for left-sided pneumothorax.  No edema or focal airspace consolidation. Cardiopericardial silhouette is at upper limits of normal for size. Imaged bony structures of the thorax are intact.  IMPRESSION: The tip of the left Port-A-Cath is positioned at the level of the distal SVC.  No evidence for pneumothorax.  Original Report Authenticated By: ERIC A. MANSELL, M.D.    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 She is has completed 4/4 planned cycles of neoadjuvant dose dense FEC with Neulasta support on day 2, due for day 1, cycle 4/4 adjuvant dose dense Taxotere on 10/16/2011.  2. Mild nail bed changes secondary to Taxotere based therapy.  Case reviewed with Dr. Pierce Crane.   Plan:  Kelly Mills is doing well, she will proceed for day 1 cycle 4/4 planned neoadjuvant dose dense Taxotere today, and we will utilize Neulasta on day 2. I will see her in 1 weeks time for nadir assessment. Of note, she is scheduled for bilateral breast MRI on 11/18/11, to see Dr. Dwain Sarna on 11/19/11 for surgical planning. This plan was reviewed with the patient, who voices understanding and  agreement.  She knows to call with any changes or problems.   Kelly Marut T, PA-C 11/13/11

## 2011-11-15 ENCOUNTER — Ambulatory Visit (HOSPITAL_BASED_OUTPATIENT_CLINIC_OR_DEPARTMENT_OTHER): Payer: BC Managed Care – PPO

## 2011-11-15 VITALS — BP 123/80 | HR 85 | Temp 97.7°F

## 2011-11-15 DIAGNOSIS — Z5189 Encounter for other specified aftercare: Secondary | ICD-10-CM

## 2011-11-15 DIAGNOSIS — C50419 Malignant neoplasm of upper-outer quadrant of unspecified female breast: Secondary | ICD-10-CM

## 2011-11-15 MED ORDER — PEGFILGRASTIM INJECTION 6 MG/0.6ML
6.0000 mg | Freq: Once | SUBCUTANEOUS | Status: AC
  Administered 2011-11-15: 6 mg via SUBCUTANEOUS
  Filled 2011-11-15: qty 0.6

## 2011-11-18 ENCOUNTER — Ambulatory Visit
Admission: RE | Admit: 2011-11-18 | Discharge: 2011-11-18 | Disposition: A | Discharge status: Home / Self Care | Payer: BC Managed Care – PPO | Source: Ambulatory Visit | Attending: General Surgery | Admitting: General Surgery

## 2011-11-18 ENCOUNTER — Telehealth: Payer: Self-pay | Admitting: Oncology

## 2011-11-18 ENCOUNTER — Telehealth: Payer: Self-pay | Admitting: Radiology

## 2011-11-18 DIAGNOSIS — C50419 Malignant neoplasm of upper-outer quadrant of unspecified female breast: Secondary | ICD-10-CM

## 2011-11-18 MED ORDER — GADOBENATE DIMEGLUMINE 529 MG/ML IV SOLN
14.0000 mL | Freq: Once | INTRAVENOUS | Status: AC | PRN
  Administered 2011-11-18: 14 mL via INTRAVENOUS

## 2011-11-18 NOTE — Telephone Encounter (Signed)
S/w the pt she is aware of her July appts and the rad onc appt.

## 2011-11-19 ENCOUNTER — Encounter (INDEPENDENT_AMBULATORY_CARE_PROVIDER_SITE_OTHER): Payer: Self-pay | Admitting: General Surgery

## 2011-11-19 ENCOUNTER — Ambulatory Visit (INDEPENDENT_AMBULATORY_CARE_PROVIDER_SITE_OTHER): Payer: BC Managed Care – PPO | Admitting: General Surgery

## 2011-11-19 VITALS — BP 120/78 | HR 74 | Resp 16 | Ht 67.0 in | Wt 159.0 lb

## 2011-11-19 DIAGNOSIS — C50419 Malignant neoplasm of upper-outer quadrant of unspecified female breast: Secondary | ICD-10-CM

## 2011-11-19 NOTE — Progress Notes (Signed)
Patient ID: Kelly Mills, female   DOB: 09/11/1970, 41 y.o.   MRN: [id number]  Chief Complaint  Patient presents with  . Follow-up    Discuss br sx    HPI Kelly Mills is a 41 y.o. female.   HPI This is a 41-year-old female who I first met in March of 2013 for a locally advanced right breast cancer. At that point in time she had MRI w in the right upper outer quadrant. There was irregular enhancement as well as a 1.1 cm satellite nodule located 8 mm away. There were  2 abnormal nodes in her axilla. Pathology revealed showed metastatic mammary carcinoma. Pathology from both masses showed these to be invasive ductal carcinoma with DCIS present. These were estrogen receptor +90%, progesterone 8%, and HER-2/neu is not amplified. She has undergone primary systemic chemotherapy with a very good response clinically. She cannot really tell where th not really tell all either. Her latest MRI  shows no residual tumor or nodes visualized. She comes in today to discuss definitive surgery. She reports no complaints except for some excessive tearing after the last chemotherapy. Past Medical History  Diagnosis Date  . Cancer     Right Breast    Past Surgical History  Procedure Date  . Nasal septum surgery   . Portacath placement 07/22/2011    Procedure: INSERTION PORT-A-CATH;  Surgeon: Alezander Dimaano, MD;  Location: [facility] OR;  Service: General;  Laterality: N/A;  Insertion of Port-A-Cath    Family History  Problem Relation Age of Onset  . Cancer Maternal Grandfather   . Anesthesia problems Neg Hx     Social History History  Substance Use Topics  . Smoking status: Never Smoker   . Smokeless tobacco: Never Used  . Alcohol Use: Yes    Allergies  Allergen Reactions  . Codeine Nausea Only    Current Outpatient Prescriptions  Medication Sig Dispense Refill  . dexamethasone (DECADRON) 4 MG tablet Take 2 tabs two times a day the day before Taxotere chemo. Then take 2 tabs daily starting  the day after chemo for 2 days.  30 tablet  1  . lidocaine-prilocaine (EMLA) cream Ad lib.      . LORazepam (ATIVAN) 0.5 MG tablet Take 1 tablet (0.5 mg total) by mouth every 6 (six) hours as needed (Nausea or vomiting).  30 tablet  0  . Multiple Vitamin (MULTIVITAMIN) capsule Take 1 capsule by mouth daily.      . ondansetron (ZOFRAN) 8 MG tablet Take 1 tablet two times a day as needed for nausea or vomiting starting on the third day after chemotherapy.  30 tablet  1  . oxyCODONE (OXY IR/ROXICODONE) 5 MG immediate release tablet 1-2 tabs po every 3-4 hours prn pain  90 tablet  0  . prochlorperazine (COMPAZINE) 10 MG tablet Take 1 tablet (10 mg total) by mouth every 6 (six) hours as needed (Nausea or vomiting).  30 tablet  1  . TRAZODONE HCL PO Take by mouth as needed.      . zolpidem (AMBIEN) 5 MG tablet Take 1 tablet (5 mg total) by mouth at bedtime as needed.  30 tablet  0    Review of Systems Review of Systems  Blood pressure 120/78, pulse 74, resp. rate 16, height 5' 7" (1.702 m), weight 159 lb (72.122 kg).  Physical Exam Physical Exam  Vitals reviewed. Constitutional: She appears well-developed and well-nourished.  Neck: Neck supple.  Cardiovascular: Normal rate, regular rhythm and normal heart   sounds.   Pulmonary/Chest: Effort normal and breath sounds normal. She has no wheezes. She has no rales. Right breast exhibits no inverted nipple, no mass, no nipple discharge, no skin change and no tenderness. Left breast exhibits no inverted nipple, no mass, no nipple discharge, no skin change and no tenderness. Breasts are symmetrical.  Lymphadenopathy:    She has no cervical adenopathy.    Data Reviewed BILATERAL BREAST MRI WITH AND WITHOUT CONTRAST  Technique: Multiplanar, multisequence MR images of both breasts  were obtained prior to and following the intravenous administration  of 14ml of Multihance. Three dimensional images were evaluated at  the independent DynaCad workstation.    Comparison: 09/26/2011 and 07/13/2011 MRIs. Prior mammograms.  Findings: Mild normal background parenchymal enhancement is  identified.  The two separate masses within the posterior outer right breast are  no longer visualized. Biopsy clip artifact in the outer right  breast is again identified, but without abnormal adjacent  enhancement.  There are no abnormal areas of enhancement identified within either  breast.  No enlarged or abnormal-appearing lymph nodes are noted  bilaterally.  The visualized bony structures and upper liver are within normal  limits.  IMPRESSION:  Right breast cancer and axillary lymph node metastases no longer  visualized - compatible with treatment response.  No MR evidence of left breast malignancy.    Assessment    Locally advanced right breast cancer s/p neoadjuvant chemotherapy    Plan    Right breast wire guided lumpectomy, right alnd  We discussed an axillary node dissection given her positive nodal disease up front.  I think there is not enough data currently to support any other procedure.  We discussed long term risk of lymphedema up to 40%, shoulder pain, numbness, drain placement. We discussed the options for treatment of the breast cancer which included lumpectomy versus a mastectomy. We discussed the performance of the lumpectomy with a wire placement. We discussed a 10-20% chance of a positive margin requiring reexcision in the operating room.  We discussed the mastectomy and the postoperative care for that as well. We discussed that there is no difference in her survival whether she undergoes lumpectomy with radiation therapy or antiestrogen therapy versus a mastectomy. There is a slight difference in the local recurrence rate being 3-5% with lumpectomy and about 1% with a mastectomy. I think she has had a great response to chemotherapy and will plan on attempting a lumpectomy. We discussed the risks of operation including bleeding,  infection, possible reoperation. She understands her further therapy will be based on what her stages at the time of her operation.         Davionne Dowty 11/19/2011, 10:07 AM    

## 2011-11-20 ENCOUNTER — Encounter: Payer: Self-pay | Admitting: Physician Assistant

## 2011-11-20 ENCOUNTER — Other Ambulatory Visit (HOSPITAL_BASED_OUTPATIENT_CLINIC_OR_DEPARTMENT_OTHER): Payer: BC Managed Care – PPO

## 2011-11-20 ENCOUNTER — Telehealth: Payer: Self-pay | Admitting: *Deleted

## 2011-11-20 ENCOUNTER — Ambulatory Visit (HOSPITAL_BASED_OUTPATIENT_CLINIC_OR_DEPARTMENT_OTHER): Payer: BC Managed Care – PPO | Admitting: Physician Assistant

## 2011-11-20 VITALS — BP 131/83 | HR 86 | Temp 99.1°F | Ht 67.0 in | Wt 159.6 lb

## 2011-11-20 DIAGNOSIS — C50419 Malignant neoplasm of upper-outer quadrant of unspecified female breast: Secondary | ICD-10-CM

## 2011-11-20 DIAGNOSIS — Z170 Estrogen receptor positive status [ER+]: Secondary | ICD-10-CM

## 2011-11-20 DIAGNOSIS — H04209 Unspecified epiphora, unspecified lacrimal gland: Secondary | ICD-10-CM

## 2011-11-20 LAB — CBC WITH DIFFERENTIAL/PLATELET
Basophils Absolute: 0.1 10*3/uL (ref 0.0–0.1)
EOS%: 0.3 % (ref 0.0–7.0)
HCT: 29.6 % — ABNORMAL LOW (ref 34.8–46.6)
HGB: 9.7 g/dL — ABNORMAL LOW (ref 11.6–15.9)
MCH: 35 pg — ABNORMAL HIGH (ref 25.1–34.0)
NEUT%: 78 % — ABNORMAL HIGH (ref 38.4–76.8)
Platelets: 119 10*3/uL — ABNORMAL LOW (ref 145–400)
lymph#: 1.4 10*3/uL (ref 0.9–3.3)

## 2011-11-20 NOTE — Telephone Encounter (Signed)
Made patient appointment for 12-25-2011 starting at 1:30pm labs followed by the md visit

## 2011-11-20 NOTE — Progress Notes (Signed)
Hematology and Oncology Follow Up Visit  Kelly Mills [id number] 1971-04-08 41 y.o. 11/20/11   HPI: Kelly Mills is a 41 year old [place] woman with a newly diagnosed ER/PR positive at 98/80% respectively, 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 She is has completed 4/4 planned cycles of neoadjuvant dose dense FEC with Neulasta support on day 2, currently day 7, cycle 4/4 adjuvant dose dense Taxotere on 10/16/2011.  Recent breast MRI.   Interim History:   Kelly Mills returns today for followup after completion of 4/4 planned neoadjuvant dose dense Taxotere today, with Neulasta support on day 2. She is feeling well. She denies any difficulty with fevers, chills, night sweats. She denies any nausea, emesis, diarrhea or constipation issues. She denies a diffuse bone pain.  She has noted a bit more fatigue.  She has noted quite abit more eye tearing, and has been using lubricating eye drops.  No frank peripheral neuropathy symptoms or skin changes, but she has noted some nailbed sensitivity. A detailed review of systems is otherwise noncontributory as noted below.  Review of Systems: Constitutional:  no weight loss, fever, night sweats and feels well Eyes: No complaints ENT: No complaints Cardiovascular: no chest pain or dyspnea on exertion Respiratory: no cough, shortness of breath, or wheezing Neurological: no TIA or stroke symptoms Dermatological: negative Gastrointestinal: no abdominal pain, change in bowel habits, or black or bloody stools Genito-Urinary: no dysuria, trouble voiding, or hematuria Hematological and Lymphatic: negative Breast: negative Musculoskeletal: Occasional muscle pain just lateral to the spine in the upper right back region, known area for "knot". Remaining ROS negative.  Medications:   I have reviewed the patient's current medications.  Current Outpatient Prescriptions  Medication Sig Dispense Refill  . dexamethasone (DECADRON)  4 MG tablet Take 2 tabs two times a day the day before Taxotere chemo. Then take 2 tabs daily starting the day after chemo for 2 days.  30 tablet  1  . lidocaine-prilocaine (EMLA) cream Ad lib.      Marland Kitchen LORazepam (ATIVAN) 0.5 MG tablet Take 1 tablet (0.5 mg total) by mouth every 6 (six) hours as needed (Nausea or vomiting).  30 tablet  0  . Multiple Vitamin (MULTIVITAMIN) capsule Take 1 capsule by mouth daily.      . ondansetron (ZOFRAN) 8 MG tablet Take 1 tablet two times a day as needed for nausea or vomiting starting on the third day after chemotherapy.  30 tablet  1  . oxyCODONE (OXY IR/ROXICODONE) 5 MG immediate release tablet 1-2 tabs po every 3-4 hours prn pain  90 tablet  0  . prochlorperazine (COMPAZINE) 10 MG tablet Take 1 tablet (10 mg total) by mouth every 6 (six) hours as needed (Nausea or vomiting).  30 tablet  1  . TRAZODONE HCL PO Take by mouth as needed.      . zolpidem (AMBIEN) 5 MG tablet Take 1 tablet (5 mg total) by mouth at bedtime as needed.  30 tablet  0    Allergies:  Allergies  Allergen Reactions  . Codeine Nausea Only    Physical Exam: Filed Vitals:   11/20/11 0935  BP: 131/83  Pulse: 86  Temp: 99.1 F (37.3 C)    Body mass index is 25.00 kg/(m^2). Weight: 159 lbs. HEENT:  Sclerae anicteric, conjunctivae pink.  Oropharynx clear.  No mucositis or candidiasis.   Nodes:  No cervical, supraclavicular, or axillary lymphadenopathy palpated.  Breast Exam: Performed by Dr. Donnie Coffin. Lungs:  Clear to auscultation bilaterally.  No crackles, rhonchi, or wheezes.   Heart:  Regular rate and rhythm.   Abdomen:  Soft, nontender.  Positive bowel sounds.  No organomegaly or masses palpated.   Musculoskeletal:  No focal spinal tenderness to palpation.  Extremities:  Benign.  No peripheral edema or cyanosis.  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   Skin:  Benign.   Neuro:  Nonfocal, alert and oriented x 3.   Lab  Results: 11/13/11 WBC: 12.7  ANC: 10.5 HBG: 10.6 PLTC: 150    Chemistry      Component Value Date/Time   NA 140 11/13/2011 1303   K 3.8 11/13/2011 1303   CL 105 11/13/2011 1303   CO2 25 11/13/2011 1303   BUN 10 11/13/2011 1303   CREATININE 0.57 11/13/2011 1303      Component Value Date/Time   CALCIUM 9.3 11/13/2011 1303   ALKPHOS 87 11/13/2011 1303   AST 18 11/13/2011 1303   ALT 16 11/13/2011 1303   BILITOT 0.3 11/13/2011 1303      Lab Results  Component Value Date   LABCA2 34 10/08/2011    Radiological Studies: 11/18/11 BILATERAL BREAST MRI WITH AND WITHOUT CONTRAST  Technique: Multiplanar, multisequence MR images of both breasts were obtained prior to and following the intravenous administration of 14ml of Multihance. Three dimensional images were evaluated at the independent DynaCad workstation.  Comparison: 09/26/2011 and 07/13/2011 MRIs. Prior mammograms. Findings: Mild normal background parenchymal enhancement is identified. The two separate masses within the posterior outer right breast are no longer visualized. Biopsy clip artifact in the outer right breast is again identified, but without abnormal adjacent enhancement.  There are no abnormal areas of enhancement identified within either breast. No enlarged or abnormal-appearing lymph nodes are noted bilaterally. The visualized bony structures and upper liver are within normal limits.  IMPRESSION:  Right breast cancer and axillary lymph node metastases no longer visualized - compatible with treatment response.  No MR evidence of left breast malignancy.  BI-RADS CATEGORY 6: Known biopsy-proven malignancy - appropriate action should be taken.  RECOMMENDATION: Treatment plan  THREE-DIMENSIONAL MR IMAGE RENDERING ON INDEPENDENT WORKSTATION:  Three-dimensional MR images were rendered by post-processing of the original MR data on an independent workstation. The three-  dimensional MR images were interpreted, and findings were reported in the  accompanying complete MRI report for this study.  Original Report Authenticated By: Rosendo Gros, M.D.    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 She is has completed 4/4 planned cycles of neoadjuvant dose dense FEC with Neulasta support on day 2, currently day 7, cycle 4/4 adjuvant dose dense Taxotere on 10/16/2011, with complete radiographic response per 11/18/11 breast MRI.  Upcoming right lumpectomy with sentinel node dissection planned for 11/27/11 with Dr. Dwain Sarna.  2. Mild nail bed changes secondary to Taxotere based therapy.  3. Excessive eye tearing secondary to therapyTaxotere-based  Case reviewed with Dr. Pierce Crane who also spoke with patient.   Plan:  Kelly Mills is doing well, and is quite pleased with MRI results. We will plan on seeing her back the first one to 2 weeks in August, in the interim she will also be seen by Dr. Michell Heinrich for radiation oncology consultation on 12/10/2011.  she has also been given a prescription for TobraDex eye drops to be used to drops and 8 I 3-4 times per  day.  This plan was reviewed with the patient, who voices understanding and agreement.  She knows to call with any changes or problems.   Kelly Mills T, PA-C 11/20/11

## 2011-11-21 ENCOUNTER — Telehealth (INDEPENDENT_AMBULATORY_CARE_PROVIDER_SITE_OTHER): Payer: Self-pay

## 2011-11-21 NOTE — Telephone Encounter (Signed)
LMOM for pt notifying her that we added a port removal to her orders per Dr Cheral Almas.

## 2011-11-27 ENCOUNTER — Telehealth (INDEPENDENT_AMBULATORY_CARE_PROVIDER_SITE_OTHER): Payer: Self-pay | Admitting: General Surgery

## 2011-11-27 NOTE — Telephone Encounter (Signed)
Ok but I won't be over there to do the oxycodone. It might be easier for Kelly Mills to fill them?

## 2011-11-27 NOTE — Telephone Encounter (Signed)
The patient called requesting refills on Ativan and Oxycodone prior to her scheduled surgery.

## 2011-11-27 NOTE — Telephone Encounter (Signed)
Contacted the patient and advised her that Dr Dwain Sarna is not going to be in the office anytime soon to write the prescription and it may be quicker to get Dr Donnie Coffin to write them. The patient stated she would call Dr Donnie Coffin

## 2011-11-27 NOTE — Progress Notes (Signed)
To come in for labs  Bring all meds and overnight bag- 

## 2011-12-03 ENCOUNTER — Encounter (HOSPITAL_BASED_OUTPATIENT_CLINIC_OR_DEPARTMENT_OTHER)
Admission: RE | Admit: 2011-12-03 | Discharge: 2011-12-03 | Disposition: A | Discharge status: Home / Self Care | Payer: BC Managed Care – PPO | Source: Ambulatory Visit | Attending: General Surgery | Admitting: General Surgery

## 2011-12-03 LAB — BASIC METABOLIC PANEL
Calcium: 9.1 mg/dL (ref 8.4–10.5)
GFR calc Af Amer: >90 mL/min (ref >90)
GFR calc non Af Amer: >90 mL/min (ref >90)
Potassium: 3.8 mEq/L (ref 3.5–5.1)
Sodium: 137 mEq/L (ref 135–145)

## 2011-12-03 LAB — CBC
Hemoglobin: 11 g/dL — ABNORMAL LOW (ref 12.0–15.0)
MCH: 35 pg — ABNORMAL HIGH (ref 26.0–34.0)
Platelets: 216 10*3/uL (ref 150–400)
RBC: 3.14 MIL/uL — ABNORMAL LOW (ref 3.87–5.11)

## 2011-12-04 ENCOUNTER — Ambulatory Visit
Admission: RE | Admit: 2011-12-04 | Discharge: 2011-12-04 | Disposition: A | Discharge status: Home / Self Care | Payer: BC Managed Care – PPO | Source: Ambulatory Visit | Attending: General Surgery | Admitting: General Surgery

## 2011-12-04 ENCOUNTER — Encounter (HOSPITAL_BASED_OUTPATIENT_CLINIC_OR_DEPARTMENT_OTHER): Payer: Self-pay | Admitting: Certified Registered"

## 2011-12-04 ENCOUNTER — Encounter (HOSPITAL_BASED_OUTPATIENT_CLINIC_OR_DEPARTMENT_OTHER): Payer: Self-pay | Admitting: *Deleted

## 2011-12-04 ENCOUNTER — Ambulatory Visit: Admit: 2011-12-04 | Payer: BC Managed Care – PPO

## 2011-12-04 ENCOUNTER — Ambulatory Visit
Admit: 2011-12-04 | Discharge: 2011-12-04 | Disposition: A | Discharge status: Home / Self Care | Payer: BC Managed Care – PPO | Attending: General Surgery | Admitting: General Surgery

## 2011-12-04 ENCOUNTER — Ambulatory Visit (HOSPITAL_BASED_OUTPATIENT_CLINIC_OR_DEPARTMENT_OTHER)
Admission: RE | Admit: 2011-12-04 | Discharge: 2011-12-05 | Disposition: A | Discharge status: Home / Self Care | Payer: BC Managed Care – PPO | Source: Ambulatory Visit | Attending: General Surgery | Admitting: General Surgery

## 2011-12-04 ENCOUNTER — Ambulatory Visit (HOSPITAL_BASED_OUTPATIENT_CLINIC_OR_DEPARTMENT_OTHER): Payer: BC Managed Care – PPO | Admitting: Certified Registered"

## 2011-12-04 ENCOUNTER — Encounter (HOSPITAL_BASED_OUTPATIENT_CLINIC_OR_DEPARTMENT_OTHER)
Admission: RE | Disposition: A | Discharge status: Home / Self Care | Payer: Self-pay | Source: Ambulatory Visit | Attending: General Surgery

## 2011-12-04 DIAGNOSIS — C50419 Malignant neoplasm of upper-outer quadrant of unspecified female breast: Secondary | ICD-10-CM

## 2011-12-04 DIAGNOSIS — C773 Secondary and unspecified malignant neoplasm of axilla and upper limb lymph nodes: Secondary | ICD-10-CM | POA: Insufficient documentation

## 2011-12-04 DIAGNOSIS — C50919 Malignant neoplasm of unspecified site of unspecified female breast: Secondary | ICD-10-CM | POA: Insufficient documentation

## 2011-12-04 DIAGNOSIS — D0590 Unspecified type of carcinoma in situ of unspecified breast: Secondary | ICD-10-CM | POA: Insufficient documentation

## 2011-12-04 DIAGNOSIS — Z452 Encounter for adjustment and management of vascular access device: Secondary | ICD-10-CM

## 2011-12-04 HISTORY — PX: BREAST LUMPECTOMY: SHX2

## 2011-12-04 HISTORY — PX: PORT-A-CATH REMOVAL: SHX5289

## 2011-12-04 HISTORY — PX: AXILLARY LYMPH NODE DISSECTION: SHX5229

## 2011-12-04 LAB — POCT HEMOGLOBIN-HEMACUE: Hemoglobin: 11.7 g/dL — ABNORMAL LOW (ref 12.0–15.0)

## 2011-12-04 SURGERY — BREAST LUMPECTOMY WITH NEEDLE LOCALIZATION
Anesthesia: General | Site: Chest | Laterality: Right | Wound class: Clean

## 2011-12-04 MED ORDER — MORPHINE SULFATE 2 MG/ML IJ SOLN
2.0000 mg | INTRAMUSCULAR | Status: DC | PRN
  Administered 2011-12-04: 2 mg via INTRAVENOUS

## 2011-12-04 MED ORDER — 0.9 % SODIUM CHLORIDE (POUR BTL) OPTIME
TOPICAL | Status: DC | PRN
  Administered 2011-12-04: 300 mL

## 2011-12-04 MED ORDER — ACETAMINOPHEN 325 MG PO TABS: 650.0000 mg | ORAL_TABLET | Freq: Four times a day (QID) | ORAL | Status: DC | PRN

## 2011-12-04 MED ORDER — LIDOCAINE HCL (CARDIAC) 20 MG/ML IV SOLN
INTRAVENOUS | Status: DC | PRN
  Administered 2011-12-04: 25 mg via INTRAVENOUS

## 2011-12-04 MED ORDER — MIDAZOLAM HCL 5 MG/5ML IJ SOLN
INTRAMUSCULAR | Status: DC | PRN
  Administered 2011-12-04: 2 mg via INTRAVENOUS

## 2011-12-04 MED ORDER — PROPOFOL 10 MG/ML IV EMUL
INTRAVENOUS | Status: DC | PRN
  Administered 2011-12-04: 200 mg via INTRAVENOUS

## 2011-12-04 MED ORDER — ACETAMINOPHEN 650 MG RE SUPP: 650.0000 mg | Freq: Four times a day (QID) | RECTAL | Status: DC | PRN

## 2011-12-04 MED ORDER — ONDANSETRON HCL 4 MG/2ML IJ SOLN
4.0000 mg | Freq: Four times a day (QID) | INTRAMUSCULAR | Status: DC | PRN
  Administered 2011-12-04 (×2): 4 mg via INTRAVENOUS

## 2011-12-04 MED ORDER — PROMETHAZINE HCL 25 MG/ML IJ SOLN
12.5000 mg | Freq: Four times a day (QID) | INTRAMUSCULAR | Status: DC | PRN
  Administered 2011-12-04: 12.5 mg via INTRAVENOUS

## 2011-12-04 MED ORDER — MIDAZOLAM HCL 2 MG/2ML IJ SOLN: 0.5000 mg | Freq: Once | INTRAMUSCULAR | Status: DC | PRN

## 2011-12-04 MED ORDER — CEFAZOLIN SODIUM 1-5 GM-% IV SOLN
1.0000 g | INTRAVENOUS | Status: AC
  Administered 2011-12-04: 1 g via INTRAVENOUS

## 2011-12-04 MED ORDER — LACTATED RINGERS IV SOLN
INTRAVENOUS | Status: DC
  Administered 2011-12-04 (×2): via INTRAVENOUS

## 2011-12-04 MED ORDER — FENTANYL CITRATE 0.05 MG/ML IJ SOLN
INTRAMUSCULAR | Status: DC | PRN
  Administered 2011-12-04 (×3): 25 ug via INTRAVENOUS
  Administered 2011-12-04: 100 ug via INTRAVENOUS

## 2011-12-04 MED ORDER — BUPIVACAINE HCL (PF) 0.25 % IJ SOLN
INTRAMUSCULAR | Status: DC | PRN
  Administered 2011-12-04: 19 mL

## 2011-12-04 MED ORDER — SODIUM CHLORIDE 0.9 % IV SOLN
INTRAVENOUS | Status: DC
  Administered 2011-12-04 – 2011-12-05 (×2): via INTRAVENOUS

## 2011-12-04 MED ORDER — DEXAMETHASONE SODIUM PHOSPHATE 4 MG/ML IJ SOLN
INTRAMUSCULAR | Status: DC | PRN
  Administered 2011-12-04: 6 mg via INTRAVENOUS

## 2011-12-04 MED ORDER — HYDROMORPHONE HCL PF 1 MG/ML IJ SOLN
0.2500 mg | INTRAMUSCULAR | Status: DC | PRN
  Administered 2011-12-04 (×4): 0.5 mg via INTRAVENOUS

## 2011-12-04 MED ORDER — PROMETHAZINE HCL 25 MG/ML IJ SOLN: 6.2500 mg | INTRAMUSCULAR | Status: DC | PRN

## 2011-12-04 MED ORDER — ONDANSETRON HCL 4 MG/2ML IJ SOLN
INTRAMUSCULAR | Status: DC | PRN
  Administered 2011-12-04: 4 mg via INTRAVENOUS

## 2011-12-04 MED ORDER — OXYCODONE HCL 5 MG PO TABS
5.0000 mg | ORAL_TABLET | ORAL | Status: DC | PRN
  Administered 2011-12-04 – 2011-12-05 (×5): 10 mg via ORAL

## 2011-12-04 MED ORDER — MEPERIDINE HCL 25 MG/ML IJ SOLN: 6.2500 mg | INTRAMUSCULAR | Status: DC | PRN

## 2011-12-04 SURGICAL SUPPLY — 63 items
APPLIER CLIP 9.375 MED OPEN (MISCELLANEOUS) ×8
BENZOIN TINCTURE PRP APPL 2/3 (GAUZE/BANDAGES/DRESSINGS) ×4 IMPLANT
BINDER BREAST LRG (GAUZE/BANDAGES/DRESSINGS) IMPLANT
BINDER BREAST MEDIUM (GAUZE/BANDAGES/DRESSINGS) IMPLANT
BINDER BREAST XLRG (GAUZE/BANDAGES/DRESSINGS) ×4 IMPLANT
BINDER BREAST XXLRG (GAUZE/BANDAGES/DRESSINGS) IMPLANT
BLADE SURG 15 STRL LF DISP TIS (BLADE) ×3 IMPLANT
BLADE SURG 15 STRL SS (BLADE) ×1
CANISTER SUCTION 1200CC (MISCELLANEOUS) ×4 IMPLANT
CHLORAPREP W/TINT 26ML (MISCELLANEOUS) ×4 IMPLANT
CLIP APPLIE 9.375 MED OPEN (MISCELLANEOUS) ×6 IMPLANT
CLOTH BEACON ORANGE TIMEOUT ST (SAFETY) ×4 IMPLANT
COVER MAYO STAND STRL (DRAPES) ×4 IMPLANT
COVER TABLE BACK 60X90 (DRAPES) ×4 IMPLANT
DECANTER SPIKE VIAL GLASS SM (MISCELLANEOUS) ×4 IMPLANT
DERMABOND ADVANCED (GAUZE/BANDAGES/DRESSINGS) ×1
DERMABOND ADVANCED .7 DNX12 (GAUZE/BANDAGES/DRESSINGS) ×3 IMPLANT
DEVICE DUBIN W/COMP PLATE 8390 (MISCELLANEOUS) ×4 IMPLANT
DRAIN CHANNEL 19F RND (DRAIN) ×4 IMPLANT
DRAPE LAPAROSCOPIC ABDOMINAL (DRAPES) ×4 IMPLANT
DRAPE PED LAPAROTOMY (DRAPES) IMPLANT
DRSG TEGADERM 4X4.75 (GAUZE/BANDAGES/DRESSINGS) ×8 IMPLANT
ELECT COATED BLADE 2.86 ST (ELECTRODE) ×4 IMPLANT
ELECT REM PT RETURN 9FT ADLT (ELECTROSURGICAL) ×4
ELECTRODE REM PT RTRN 9FT ADLT (ELECTROSURGICAL) ×3 IMPLANT
EVACUATOR SILICONE 100CC (DRAIN) ×4 IMPLANT
GAUZE SPONGE 4X4 12PLY STRL LF (GAUZE/BANDAGES/DRESSINGS) ×8 IMPLANT
GLOVE BIO SURGEON STRL SZ7 (GLOVE) ×8 IMPLANT
GLOVE BIOGEL PI IND STRL 7.5 (GLOVE) ×3 IMPLANT
GLOVE BIOGEL PI INDICATOR 7.5 (GLOVE) ×1
GLOVE ECLIPSE 6.5 STRL STRAW (GLOVE) ×8 IMPLANT
GOWN PREVENTION PLUS XLARGE (GOWN DISPOSABLE) ×8 IMPLANT
KIT MARKER MARGIN INK (KITS) ×4 IMPLANT
NEEDLE HYPO 25X1 1.5 SAFETY (NEEDLE) ×4 IMPLANT
NS IRRIG 1000ML POUR BTL (IV SOLUTION) ×4 IMPLANT
PACK BASIN DAY SURGERY FS (CUSTOM PROCEDURE TRAY) ×4 IMPLANT
PENCIL BUTTON HOLSTER BLD 10FT (ELECTRODE) ×4 IMPLANT
PIN SAFETY STERILE (MISCELLANEOUS) ×4 IMPLANT
SHEET MEDIUM DRAPE 40X70 STRL (DRAPES) ×4 IMPLANT
SLEEVE SCD COMPRESS KNEE MED (MISCELLANEOUS) ×4 IMPLANT
SPONGE LAP 4X18 X RAY DECT (DISPOSABLE) ×8 IMPLANT
STAPLER VISISTAT 35W (STAPLE) ×4 IMPLANT
STOCKINETTE IMPERVIOUS LG (DRAPES) ×4 IMPLANT
STRIP CLOSURE SKIN 1/2X4 (GAUZE/BANDAGES/DRESSINGS) ×4 IMPLANT
SUT ETHILON 2 0 FS 18 (SUTURE) ×4 IMPLANT
SUT MNCRL AB 4-0 PS2 18 (SUTURE) ×8 IMPLANT
SUT MON AB 4-0 PC3 18 (SUTURE) IMPLANT
SUT MON AB 5-0 PS2 18 (SUTURE) IMPLANT
SUT SILK 2 0 SH (SUTURE) ×4 IMPLANT
SUT VIC AB 2-0 SH 27 (SUTURE) ×3
SUT VIC AB 2-0 SH 27XBRD (SUTURE) ×9 IMPLANT
SUT VIC AB 3-0 SH 27 (SUTURE) ×2
SUT VIC AB 3-0 SH 27X BRD (SUTURE) ×6 IMPLANT
SUT VIC AB 5-0 PS2 18 (SUTURE) IMPLANT
SUT VICRYL AB 2 0 TIE (SUTURE) ×3 IMPLANT
SUT VICRYL AB 2 0 TIES (SUTURE) ×1
SUT VICRYL AB 3 0 TIES (SUTURE) IMPLANT
SYR CONTROL 10ML LL (SYRINGE) ×4 IMPLANT
TOWEL OR 17X24 6PK STRL BLUE (TOWEL DISPOSABLE) ×8 IMPLANT
TOWEL OR NON WOVEN STRL DISP B (DISPOSABLE) IMPLANT
TUBE CONNECTING 20X1/4 (TUBING) ×4 IMPLANT
WATER STERILE IRR 1000ML POUR (IV SOLUTION) IMPLANT
YANKAUER SUCT BULB TIP NO VENT (SUCTIONS) ×4 IMPLANT

## 2011-12-04 NOTE — Anesthesia Postprocedure Evaluation (Signed)
  Anesthesia Post-op Note  Patient: Kelly Mills  Procedure(s) Performed: Procedure(s) (LRB): BREAST LUMPECTOMY WITH NEEDLE LOCALIZATION (Right) AXILLARY LYMPH NODE DISSECTION (Right) REMOVAL PORT-A-CATH (Left)  Patient Location: PACU  Anesthesia Type: General  Level of Consciousness: awake, alert  and oriented  Airway and Oxygen Therapy: Patient Spontanous Breathing  Post-op Pain: none  Post-op Assessment: Post-op Vital signs reviewed, Patient's Cardiovascular Status Stable, Respiratory Function Stable, Patent Airway, No signs of Nausea or vomiting and Pain level controlled  Post-op Vital Signs: Reviewed and stable  Complications: No apparent anesthesia complications

## 2011-12-04 NOTE — Op Note (Signed)
Preoperative diagnosis: Locally advanced right breast cancer status post primary systemic chemotherapy Postoperative diagnosis: Same as above Procedure: #1 removal of left subclavian port #2 right wire-guided lumpectomy #3 right axillary lymph node dissection Surgeon: Dr. Harden Mo Anesthesia: Gen. Specimens: #1 right breast tissue marked with paint #2 right axillary contents Drains: 19 French Blake drain to right axilla Consultations: None estimated blood loss: Minimal Sponge and needle count was correct x2 at end of operation Disposition to recovery room stable  Indications: This is a 41 year old female present with a locally advanced node positive right breast cancer. She was seen in the multidisciplinary clinic and we decided to proceed with primary systemic chemotherapy. I placed a left subclavian port. She tolerated her chemotherapy well. On her exam as on MRI the cancer has pretty much disappeared. We discussed all of the different options and decided to make an attempt at breast conservation therapy with lumpectomy. Due to node positivity prior to therapy we also discussed a right axillary lymph node dissection. She is also wanted her port removed.  Procedure: After informed consent was obtained the patient was first taken to the breast center where she had wires placed at both of the prior clips. I had these mammograms available for my review in the operating room. She was administered 1 g of intravenous cefazolin. Sequential compression devices were placed the legs. She was then placed under general anesthesia without complication. Her left chest, right breast, and right arm were prepped and draped in the standard sterile surgical fashion. Surgical timeout was then performed.  I first removed the port. I infiltrated Marcaine around this area. I reentered her old incision and removed the port in its entirety. This was closed with 3-0 Vicryl, 4-0 Monocryl, and Dermabond. I then  proceeded to the lumpectomy.  I made an elliptical incision overlying the wire tracks and removed small paddle of skin. I then used cautery to excise the wires and the surrounding tissue. I removed this all the way down to the pectoralis muscle including the pectoralis fascia. The deep margin of this specimen is her muscle. A mammogram was then taken confirming removal of the clips and the wires. There was no palpable mass in this region anymore. I then placed 2 clips deep. I placed one clip in each position around the cavity. Hemostasis was observed. I closed this with 2-0 Vicryl for the breast tissue, 3-0 Vicryl for the dermis, and 4-0 Monocryl.  I then made a right axillary incision between her pectoralis and latissimus. I carried this through her axillary fascia. I then identified her axillary vein, the thoracodorsal bundle, and long thoracic nerve. I removed all of the lymphatic tissue caudad to the axillary vein leaving both the nerves in place. Both nerves were functional upon completion. There were enlarged lymph nodes in the specimen. This area was completely evacuated of any tissue. Hemostasis was observed. I then placed a 15 [place] Blake drain and secured this with a 2-0 nylon. I then close her axillary fascia with a 2-0 Vicryl. The remainder was closed with 3-0 Vicryl for Monocryl. I infiltrated Marcaine throughout the breast. I then used Steri-Strips and sterile dressings. She tolerated this well was extubated and transferred to recovery stable.

## 2011-12-04 NOTE — Progress Notes (Signed)
Determined that pt has not been to breast center this morning for wire insertion. Discussed with Dr. Dwain Sarna who wants pt to go there now for insertion. Called breast center who will be ready to accommodate her as soon as she arrives. Explained to pt and sent her to [facility] breast center. Pt voiced understanding.

## 2011-12-04 NOTE — Anesthesia Procedure Notes (Signed)
Procedure Name: LMA Insertion Date/Time: 12/04/2011 10:06 AM Performed by: Verlan Friends Pre-anesthesia Checklist: Patient identified, Emergency Drugs available, Suction available, Patient being monitored and Timeout performed Patient Re-evaluated:Patient Re-evaluated prior to inductionOxygen Delivery Method: Circle System Utilized Preoxygenation: Pre-oxygenation with 100% oxygen Intubation Type: IV induction Ventilation: Mask ventilation without difficulty LMA: LMA inserted LMA Size: 4.0 Number of attempts: 1 Airway Equipment and Method: bite block Placement Confirmation: positive ETCO2 Tube secured with: Tape Dental Injury: Teeth and Oropharynx as per pre-operative assessment

## 2011-12-04 NOTE — Transfer of Care (Signed)
Immediate Anesthesia Transfer of Care Note  Patient: Kelly Mills  Procedure(s) Performed: Procedure(s) (LRB): BREAST LUMPECTOMY WITH NEEDLE LOCALIZATION (Right) AXILLARY LYMPH NODE DISSECTION (Right) REMOVAL PORT-A-CATH (Left)  Patient Location: PACU  Anesthesia Type: General  Level of Consciousness: awake, alert , oriented and patient cooperative  Airway & Oxygen Therapy: Patient Spontanous Breathing and Patient connected to face mask oxygen  Post-op Assessment: Report given to PACU RN and Post -op Vital signs reviewed and stable  Post vital signs: Reviewed and stable  Complications: No apparent anesthesia complications

## 2011-12-04 NOTE — H&P (View-Only) (Signed)
Patient ID: Kelly Mills, female   DOB: [birth year], 41 y.o.   MRN: [id number]  Chief Complaint  Patient presents with  . Follow-up    Discuss br sx    HPI Kelly Mills is a 41 y.o. female.   HPI This is a 41 year old female who I first met in March of 2013 for a locally advanced right breast cancer. At that point in time she had MRI w in the right upper outer quadrant. There was irregular enhancement as well as a 1.1 cm satellite nodule located 8 mm away. There were  2 abnormal nodes in her axilla. Pathology revealed showed metastatic mammary carcinoma. Pathology from both masses showed these to be invasive ductal carcinoma with DCIS present. These were estrogen receptor +90%, progesterone 8%, and HER-2/neu is not amplified. She has undergone primary systemic chemotherapy with a very good response clinically. She cannot really tell where th not really tell all either. Her latest MRI  shows no residual tumor or nodes visualized. She comes in today to discuss definitive surgery. She reports no complaints except for some excessive tearing after the last chemotherapy. Past Medical History  Diagnosis Date  . Cancer     Right Breast    Past Surgical History  Procedure Date  . Nasal septum surgery   . Portacath placement 07/22/2011    Procedure: INSERTION PORT-A-CATH;  Surgeon: [name], MD;  Location: [facility] OR;  Service: General;  Laterality: N/A;  Insertion of Port-A-Cath    Family History  Problem Relation Age of Onset  . Cancer Maternal Grandfather   . Anesthesia problems Neg Hx     Social History History  Substance Use Topics  . Smoking status: Never Smoker   . Smokeless tobacco: Never Used  . Alcohol Use: Yes    Allergies  Allergen Reactions  . Codeine Nausea Only    Current Outpatient Prescriptions  Medication Sig Dispense Refill  . dexamethasone (DECADRON) 4 MG tablet Take 2 tabs two times a day the day before Taxotere chemo. Then take 2 tabs daily starting  the day after chemo for 2 days.  30 tablet  1  . lidocaine-prilocaine (EMLA) cream Ad lib.      Marland Kitchen LORazepam (ATIVAN) 0.5 MG tablet Take 1 tablet (0.5 mg total) by mouth every 6 (six) hours as needed (Nausea or vomiting).  30 tablet  0  . Multiple Vitamin (MULTIVITAMIN) capsule Take 1 capsule by mouth daily.      . ondansetron (ZOFRAN) 8 MG tablet Take 1 tablet two times a day as needed for nausea or vomiting starting on the third day after chemotherapy.  30 tablet  1  . oxyCODONE (OXY IR/ROXICODONE) 5 MG immediate release tablet 1-2 tabs po every 3-4 hours prn pain  90 tablet  0  . prochlorperazine (COMPAZINE) 10 MG tablet Take 1 tablet (10 mg total) by mouth every 6 (six) hours as needed (Nausea or vomiting).  30 tablet  1  . TRAZODONE HCL PO Take by mouth as needed.      . zolpidem (AMBIEN) 5 MG tablet Take 1 tablet (5 mg total) by mouth at bedtime as needed.  30 tablet  0    Review of Systems Review of Systems  Blood pressure 120/78, pulse 74, resp. rate 16, height 5\' 7"  (1.702 m), weight 159 lb (72.122 kg).  Physical Exam Physical Exam  Vitals reviewed. Constitutional: She appears well-developed and well-nourished.  Neck: Neck supple.  Cardiovascular: Normal rate, regular rhythm and normal heart  sounds.   Pulmonary/Chest: Effort normal and breath sounds normal. She has no wheezes. She has no rales. Right breast exhibits no inverted nipple, no mass, no nipple discharge, no skin change and no tenderness. Left breast exhibits no inverted nipple, no mass, no nipple discharge, no skin change and no tenderness. Breasts are symmetrical.  Lymphadenopathy:    She has no cervical adenopathy.    Data Reviewed BILATERAL BREAST MRI WITH AND WITHOUT CONTRAST  Technique: Multiplanar, multisequence MR images of both breasts  were obtained prior to and following the intravenous administration  of 14ml of Multihance. Three dimensional images were evaluated at  the independent DynaCad workstation.    Comparison: 09/26/2011 and 07/13/2011 MRIs. Prior mammograms.  Findings: Mild normal background parenchymal enhancement is  identified.  The two separate masses within the posterior outer right breast are  no longer visualized. Biopsy clip artifact in the outer right  breast is again identified, but without abnormal adjacent  enhancement.  There are no abnormal areas of enhancement identified within either  breast.  No enlarged or abnormal-appearing lymph nodes are noted  bilaterally.  The visualized bony structures and upper liver are within normal  limits.  IMPRESSION:  Right breast cancer and axillary lymph node metastases no longer  visualized - compatible with treatment response.  No MR evidence of left breast malignancy.    Assessment    Locally advanced right breast cancer s/p neoadjuvant chemotherapy    Plan    Right breast wire guided lumpectomy, right alnd  We discussed an axillary node dissection given her positive nodal disease up front.  I think there is not enough data currently to support any other procedure.  We discussed long term risk of lymphedema up to 40%, shoulder pain, numbness, drain placement. We discussed the options for treatment of the breast cancer which included lumpectomy versus a mastectomy. We discussed the performance of the lumpectomy with a wire placement. We discussed a 10-20% chance of a positive margin requiring reexcision in the operating room.  We discussed the mastectomy and the postoperative care for that as well. We discussed that there is no difference in her survival whether she undergoes lumpectomy with radiation therapy or antiestrogen therapy versus a mastectomy. There is a slight difference in the local recurrence rate being 3-5% with lumpectomy and about 1% with a mastectomy. I think she has had a great response to chemotherapy and will plan on attempting a lumpectomy. We discussed the risks of operation including bleeding,  infection, possible reoperation. She understands her further therapy will be based on what her stages at the time of her operation.         Hakan Nudelman 11/19/2011, 10:07 AM

## 2011-12-04 NOTE — Interval H&P Note (Signed)
History and Physical Interval Note:  12/04/2011 9:43 AM  Kelly Mills  has presented today for surgery, with the diagnosis of right breast cancer   The various methods of treatment have been discussed with the patient and family. After consideration of risks, benefits and other options for treatment, the patient has consented to  Procedure(s) (LRB): BREAST LUMPECTOMY WITH NEEDLE LOCALIZATION (Right) AXILLARY LYMPH NODE DISSECTION (Right) REMOVAL PORT-A-CATH (N/A) as a surgical intervention .  The patient's history has been reviewed, patient examined, no change in status, stable for surgery.  I have reviewed the patient's chart and labs.  Questions were answered to the patient's satisfaction.     Kelly Mills

## 2011-12-04 NOTE — Anesthesia Preprocedure Evaluation (Signed)
Anesthesia Evaluation  Patient identified by MRN, date of birth, ID band Patient awake    Reviewed: Allergy & Precautions, H&P , NPO status , Patient's Chart, lab work & pertinent test results  History of Anesthesia Complications Negative for: history of anesthetic complications  Airway Mallampati: I TM Distance: >3 FB Neck ROM: Full    Dental No notable dental hx. (+) Teeth Intact and Dental Advisory Given   Pulmonary neg pulmonary ROS,  breath sounds clear to auscultation  Pulmonary exam normal       Cardiovascular negative cardio ROS  Rhythm:Regular Rate:Normal     Neuro/Psych negative neurological ROS     GI/Hepatic negative GI ROS, Neg liver ROS,   Endo/Other  negative endocrine ROS  Renal/GU negative Renal ROS     Musculoskeletal   Abdominal   Peds  Hematology   Anesthesia Other Findings Breast cancer: chemo, surgery  Reproductive/Obstetrics                           Anesthesia Physical Anesthesia Plan  ASA: II  Anesthesia Plan: General   Post-op Pain Management:    Induction: Intravenous  Airway Management Planned: LMA  Additional Equipment:   Intra-op Plan:   Post-operative Plan:   Informed Consent: I have reviewed the patients History and Physical, chart, labs and discussed the procedure including the risks, benefits and alternatives for the proposed anesthesia with the patient or authorized representative who has indicated his/her understanding and acceptance.   Dental advisory given  Plan Discussed with: CRNA and Surgeon  Anesthesia Plan Comments: (Plan routine monitors, GA- LMA OK)        Anesthesia Quick Evaluation

## 2011-12-05 ENCOUNTER — Telehealth (INDEPENDENT_AMBULATORY_CARE_PROVIDER_SITE_OTHER): Payer: Self-pay | Admitting: General Surgery

## 2011-12-05 ENCOUNTER — Encounter (HOSPITAL_BASED_OUTPATIENT_CLINIC_OR_DEPARTMENT_OTHER): Payer: Self-pay | Admitting: General Surgery

## 2011-12-05 MED ORDER — OXYCODONE HCL 5 MG PO TABS: 5.0000 mg | ORAL_TABLET | ORAL | Status: DC | PRN

## 2011-12-05 MED ORDER — PROMETHAZINE HCL 12.5 MG PO TABS: 12.5000 mg | ORAL_TABLET | Freq: Four times a day (QID) | ORAL | Status: DC | PRN

## 2011-12-05 NOTE — Telephone Encounter (Signed)
E prescribing error. Phenergan 12.5 mg #10 no refills called to [employer].

## 2011-12-05 NOTE — Discharge Summary (Addendum)
Physician Discharge Summary  Patient ID: Kelly Mills MRN: [id number] DOB/AGE: 10/03/1970 41 y.o.  Admit date: 12/04/2011 Discharge date: 12/05/2011  Admission Diagnoses: Locally advanced right breast cancer s/p primary chemotherapy  Discharge Diagnoses:  Active Problems:  * No active hospital problems. *  Same as above  Discharged Condition: good  Hospital Course: 43 yof who underwent primary chemo for LABC with good response.  She then underwent port removal, right wire guided lumpectomy and alnd.  She has done well overnight and will be discharged home  Consults: None  Significant Diagnostic Studies: none  Treatments: surgery: port removal, right alnd, right wire guided lumpectomy  Discharge Exam: Blood pressure 101/62, pulse 76, temperature 97.8 F (36.6 C), temperature source Oral, resp. rate 16, height 5\' 7"  (1.702 m), weight 159 lb (72.122 kg), last menstrual period 09/27/2011, SpO2 99.00%.  Disposition: 01-Home or Self Care  Discharge Orders    Future Appointments: Provider: Department: Dept Phone: Center:   12/10/2011 7:30 AM [facility] Nurse [facility] [phone] None   12/10/2011 8:00 AM [name], MD [facility] [phone] None   12/25/2011 1:30 PM [name] [facility] [phone] None   12/25/2011 2:30 PM Pierce Crane, MD [facility] [phone] None     Medication List  As of 12/05/2011  7:57 AM   STOP taking these medications         dexamethasone 4 MG tablet      lidocaine-prilocaine cream         TAKE these medications         LORazepam 0.5 MG tablet   Commonly known as: ATIVAN   Take 1 tablet (0.5 mg total) by mouth every 6 (six) hours as needed (Nausea or vomiting).      multivitamin capsule   Take 1 capsule by mouth daily.      ondansetron 8 MG tablet   Commonly known as: ZOFRAN   Take 1 tablet two times a day as needed for nausea or vomiting starting on the third day after chemotherapy.     oxyCODONE 5 MG immediate release tablet   Commonly known as: Oxy IR/ROXICODONE   Take 1-2 tablets (5-10 mg total) by mouth every 4 (four) hours as needed.      prochlorperazine 10 MG tablet   Commonly known as: COMPAZINE   Take 1 tablet (10 mg total) by mouth every 6 (six) hours as needed (Nausea or vomiting).      promethazine 12.5 MG tablet   Commonly known as: PHENERGAN   Take 1 tablet (12.5 mg total) by mouth every 6 (six) hours as needed for nausea.      tobramycin-dexamethasone ophthalmic solution   Commonly known as: TOBRADEX   Place 2 drops into both eyes every 4 (four) hours while awake.      TRAZODONE HCL PO   Take by mouth as needed.      zolpidem 5 MG tablet   Commonly known as: AMBIEN   Take 1 tablet (5 mg total) by mouth at bedtime as needed.           Follow-up Information    Follow up with [facility], MD in 10 days. Elease Hashimoto will call with appointment)    Contact information:   [facility] [address] [place] [address] [phone]          Signed: [name] 12/05/2011, 7:57 AM

## 2011-12-06 ENCOUNTER — Telehealth (INDEPENDENT_AMBULATORY_CARE_PROVIDER_SITE_OTHER): Payer: Self-pay

## 2011-12-06 NOTE — Telephone Encounter (Signed)
LMOM for pt to call me so I can giver her a post op appt.

## 2011-12-09 ENCOUNTER — Encounter: Payer: Self-pay | Admitting: Radiation Oncology

## 2011-12-10 ENCOUNTER — Other Ambulatory Visit: Payer: Self-pay | Admitting: Radiation Oncology

## 2011-12-10 ENCOUNTER — Telehealth (INDEPENDENT_AMBULATORY_CARE_PROVIDER_SITE_OTHER): Payer: Self-pay

## 2011-12-10 ENCOUNTER — Ambulatory Visit
Admission: RE | Admit: 2011-12-10 | Discharge: 2011-12-10 | Disposition: A | Discharge status: Home / Self Care | Payer: BC Managed Care – PPO | Source: Ambulatory Visit | Attending: Radiation Oncology | Admitting: Radiation Oncology

## 2011-12-10 ENCOUNTER — Encounter: Payer: Self-pay | Admitting: Radiation Oncology

## 2011-12-10 VITALS — BP 128/88 | HR 71 | Temp 98.5°F | Resp 20 | Ht 67.0 in | Wt 164.0 lb

## 2011-12-10 DIAGNOSIS — C50419 Malignant neoplasm of upper-outer quadrant of unspecified female breast: Secondary | ICD-10-CM

## 2011-12-10 DIAGNOSIS — Z510 Encounter for antineoplastic radiation therapy: Secondary | ICD-10-CM | POA: Insufficient documentation

## 2011-12-10 DIAGNOSIS — I4949 Other premature depolarization: Secondary | ICD-10-CM | POA: Insufficient documentation

## 2011-12-10 DIAGNOSIS — C50919 Malignant neoplasm of unspecified site of unspecified female breast: Secondary | ICD-10-CM | POA: Insufficient documentation

## 2011-12-10 DIAGNOSIS — C773 Secondary and unspecified malignant neoplasm of axilla and upper limb lymph nodes: Secondary | ICD-10-CM | POA: Insufficient documentation

## 2011-12-10 DIAGNOSIS — L988 Other specified disorders of the skin and subcutaneous tissue: Secondary | ICD-10-CM | POA: Insufficient documentation

## 2011-12-10 HISTORY — DX: Malignant neoplasm of unspecified site of unspecified female breast: C50.919

## 2011-12-10 MED ORDER — OXYCODONE HCL 5 MG PO TABS: 5.0000 mg | ORAL_TABLET | ORAL | Status: DC | PRN

## 2011-12-10 NOTE — Progress Notes (Signed)
Pt has drain in right breast, draining clear yellow fluid, approx 130 cc/day. Pt states she is to see Dr Dwain Sarna when drainage is less than 30 cc/day. Pt taking Oxy-IR for pain, was taking 8 tabs daily, now taking 2 daily in evening when pain is worse. C/o "muscle soreness in right shoulder" as well.

## 2011-12-10 NOTE — Telephone Encounter (Signed)
Pt returned my call . I scheduled for f/u appt with Dr Dwain Sarna on 8/5.

## 2011-12-10 NOTE — Progress Notes (Signed)
Followup note:  Diagnosis: Pathologic stage II A (TI c N1 M0) invasive ductal carcinoma the right breast  Kelly Mills returns today approximately one week following completion of a right partial mastectomy and axillary dissection in the management of her clinical stage IIB (T2, N1, M0), pathologic stage TII A. (T1 C. N1 M0) invasive ductal carcinoma the right breast. She presented with a palpable breast mass at 2:00 in early 2013. On MRI the mass measured 3.3 x 2.3 x 2.0 cm at 9:00. There was a second enhancing mass measuring 1.1 x 0.8 x 0.9 cm, 8 mm posterior and inferior to the larger mass. In aggregate both lesions extended over 4.2 cm, and there was a suspicious axillary lymph node. A needle core biopsy on 07/19/2011 was diagnostic for invasive ductal carcinoma which was strongly ER positive at 90% and weakly PR positive at 28% with an elevated proliferation marker of 83%. HER-2/neu was not amplified. Earlier on February 25 her right breast mass was positive for invasive mammary carcinoma along with an axillary lymph node. She went onto receive neoadjuvant chemotherapy under the direction of Dr. Pierce Crane. MRI scan on 11/18/2011 showed that 2 separate masses within the posterior outer right breast with no longer visualized. There were no abnormal appearing lymph nodes. Dr. Dwain Sarna performed a right partial mastectomy and axillary node dissection and carried the partial mastectomy down to the pectoralis fascia. On review of her pathology she was found to have a residual 1.4 cm invasive ductal carcinoma with the deep margin being 0.01 mm. The closest margin for in situ carcinoma was 0.7 cm. There was no LV I. 3of 8  lymph nodes contain metastatic disease, one being a micrometastatis to lymph node and the others measuring 2 mm and 6 mm. There was extracapsular extension. In addition there were 2 additional perinodal soft tissue deposits measuring 3 mm and 4 mm with no associated lymphoid architecture  suggesting that they represented additional completely replaced lymph nodes to suggest that she had 5 of 10 lymph nodes involved. She is doing well postoperatively. She is currently draining approximately 30 cc of fluid from her right axillary drain a day. She continues to work and raise her 2 young children.  Physical examination: Alert and oriented 41 year old white female appearing her stated age. Filed Vitals:   12/10/11 0741  BP: 128/88  Pulse: 71  Temp: 98.5 F (36.9 C)  Resp: 20   Head and neck examination: Grossly unremarkable. She wears a wig. Nodes: Without palpable cervical, supraclavicular, or axillary lymphadenopathy. There is right axillary drain. Chest: Lungs clear. Breasts: Surgical dressings along the lateral aspect of the right breast at approximately 9:00. The dressings were not removed. Left breast without masses or lesions. Abdomen without hepatomegaly. Extremities without edema.  Laboratory data: Lab Results  Component Value Date   WBC 4.2 12/03/2011   HGB 11.7* 12/04/2011   HCT 33.9* 12/03/2011   MCV 108.0* 12/03/2011   PLT 216 12/03/2011   Impression: Clinical stage IIB (T2, N1, M0) invasive ductal carcinoma the right breast. Pathologic stage II A (T1 N1 versus N2 M0). We again discussed the indications for radiation therapy not only to her breast but her regional lymph nodes. We discussed the potential acute and late toxicities of radiation therapy including lymphedema. Radiation therapy will be followed by adjuvant hormone therapy. I will have her return for treatment planning the week of August 19 and begin her treatment by August 26. Consent is signed today.  30 minutes was spent  face-to-face with the patient, primarily counseling the patient and coordinating her care.

## 2011-12-10 NOTE — Progress Notes (Signed)
Please see the Nurse Progress Note in the MD Initial Consult Encounter for this patient. 

## 2011-12-11 ENCOUNTER — Encounter: Payer: Self-pay | Admitting: Oncology

## 2011-12-13 NOTE — Addendum Note (Signed)
Encounter addended by: Glennie Hawk, RN on: 12/13/2011  9:26 AM<BR>     Documentation filed: Charges VN

## 2011-12-16 ENCOUNTER — Encounter (INDEPENDENT_AMBULATORY_CARE_PROVIDER_SITE_OTHER): Payer: Self-pay | Admitting: General Surgery

## 2011-12-16 ENCOUNTER — Ambulatory Visit (INDEPENDENT_AMBULATORY_CARE_PROVIDER_SITE_OTHER): Payer: BC Managed Care – PPO | Admitting: General Surgery

## 2011-12-16 VITALS — BP 136/84 | HR 76 | Temp 98.3°F | Resp 16 | Ht 67.0 in | Wt 162.0 lb

## 2011-12-16 DIAGNOSIS — Z09 Encounter for follow-up examination after completed treatment for conditions other than malignant neoplasm: Secondary | ICD-10-CM

## 2011-12-16 NOTE — Progress Notes (Signed)
Subjective:     Patient ID: Kelly Mills, female   DOB: 1970-08-13, 41 y.o.   MRN: [id number]  HPI This is a 41 year old female who underwent primary systemic chemotherapy for a locally advanced right breast cancer. On July 24 I took her to the operating room and a right breast wire-guided lumpectomy as well as axillary node dissection. I also took out her port. She is doing well. She complains only some numbness and some soreness in her right upper extremity. She otherwise is doing very well. Her drain is putting out about 100 cc of serous lymphatic looking fluid still. Her axillary dissection was very difficult due to a lot of scarring near her axillary vein. Her pathology as I discussed with her on the phone shows an invasive ductal carcinoma that is grade 1. Her margins are negative although she has a 0.01 mm posterior margin. This is her pectoralis muscle. I took her pectoralis fascia so there is really nothing else to take in this region. She has 3/8 lymph nodes that show a 2, 3, and 6 mm deposit of tumor.  Review of Systems     Objective:   Physical Exam Well-healing breast and axillary incisions without infection Drain was serous fluid present.    Assessment:     Status post right breast lumpectomy and axillary node dissection locally advanced right breast cancer    Plan:     I would leave her drain in. I'm not surprised about the amount due to the difficulty with the dissection. She is going to continue her exercises. She has seen Dr. Dayton Scrape with radiation oncology. I will see her back next week and hopefully remove her drain at that point.

## 2011-12-23 ENCOUNTER — Encounter (INDEPENDENT_AMBULATORY_CARE_PROVIDER_SITE_OTHER): Payer: Self-pay | Admitting: General Surgery

## 2011-12-23 ENCOUNTER — Ambulatory Visit (INDEPENDENT_AMBULATORY_CARE_PROVIDER_SITE_OTHER): Payer: BC Managed Care – PPO | Admitting: General Surgery

## 2011-12-23 DIAGNOSIS — Z09 Encounter for follow-up examination after completed treatment for conditions other than malignant neoplasm: Secondary | ICD-10-CM

## 2011-12-23 MED ORDER — TRAMADOL HCL 50 MG PO TABS: 50.0000 mg | ORAL_TABLET | Freq: Four times a day (QID) | ORAL | Status: AC | PRN

## 2011-12-23 NOTE — Progress Notes (Signed)
Subjective:     Patient ID: Kelly Mills, female   DOB: 1970-10-28, 41 y.o.   MRN: [id number]  HPI This is a 41 year old female underwent primary systemic chemotherapy followed by lumpectomy and axillary node dissection. She's done very well from that. She's been seen by radiation oncology is due to start at the end of the month. She returns today with less than 30 cc out of her drain for the last several days. She reports some back pain as well on the other side which she thinks is positional.  Review of Systems     Objective:   Physical Exam Well-healed right breast and right axillary incisions, draining with serous output    Assessment:     Status post right breast lumpectomy and axillary node dissection    Plan:     I removed her drain today. We discussed covering that hole for the next several days. I also refer her to physical therapy and lymphedema prevention clinic. We discussed the free visit they have as well. I gave her a prescription for tramadol due to her back pain I think this will help. We also discussed stretching exercises exercises moving forward. I will see her back in 6 months. I gave her warning signs for a seroms. She is fine to begin radiation therapy.

## 2011-12-25 ENCOUNTER — Ambulatory Visit: Payer: BC Managed Care – PPO | Admitting: Family

## 2011-12-25 ENCOUNTER — Telehealth: Payer: Self-pay | Admitting: Oncology

## 2011-12-25 ENCOUNTER — Other Ambulatory Visit: Payer: BC Managed Care – PPO | Admitting: Lab

## 2011-12-25 NOTE — Progress Notes (Signed)
No show. Will send POF to reschedule.  

## 2011-12-25 NOTE — Telephone Encounter (Signed)
S/w the pt and she is aware of her aug 2013 appts

## 2012-01-01 ENCOUNTER — Ambulatory Visit
Admission: RE | Admit: 2012-01-01 | Discharge: 2012-01-01 | Disposition: A | Discharge status: Home / Self Care | Payer: BC Managed Care – PPO | Source: Ambulatory Visit | Attending: Radiation Oncology | Admitting: Radiation Oncology

## 2012-01-01 DIAGNOSIS — C50419 Malignant neoplasm of upper-outer quadrant of unspecified female breast: Secondary | ICD-10-CM

## 2012-01-01 NOTE — Progress Notes (Signed)
Met with patient to discuss RO billing. ° °Dx: 174.4 Upper-outer quadrant of breast ° °Attending Rad: Dr. Murray ° ° °Rad Tx: [id number] Extrl Beam °

## 2012-01-01 NOTE — Progress Notes (Signed)
Simulation/treatment planning note:  The patient was taken to the CT simulator. A custom neck mold was placed on a breast board. Her field borders were marked with radiopaque wires. Her partial mastectomy scar was also marked with a radiopaque wire. She was then scanned. I contoured her tumor bed guided by her surgical clips. She was set up to medial and lateral right breast tangents and 2 separate multileaf collimators were designed to conform the field. She was then set up to her right supraclavicular/axillary region, LAO and a separate multileaf collimator was designed. Lastly, she was set up PA to her right axilla for her supplemental axillary field and a separate multileaf collimator was designed for a total of 5 complex treatment arises. I prescribing 5040 cGy in 28 sessions utilizing 6 and/or mixed energy photons. An isodose plan is requested. Dosimetry is also requested. I prescribing 5040 cGy to the right supraclavicular/axillary region, prescribed at a dose of 3 cm with a PA axillary field utilized to bring the mid axillary dose up to 4600 cGy in 28 sessions. She'll then undergo a boost to her right chest wall/tumor bed for a further 1440 cGy in 8 sessions.

## 2012-01-02 ENCOUNTER — Ambulatory Visit: Payer: BC Managed Care – PPO | Admitting: Family

## 2012-01-03 ENCOUNTER — Ambulatory Visit: Payer: BC Managed Care – PPO | Admitting: Radiation Oncology

## 2012-01-06 ENCOUNTER — Encounter: Payer: Self-pay | Admitting: Radiation Oncology

## 2012-01-06 ENCOUNTER — Ambulatory Visit
Admission: RE | Admit: 2012-01-06 | Discharge: 2012-01-06 | Disposition: A | Discharge status: Home / Self Care | Payer: BC Managed Care – PPO | Source: Ambulatory Visit | Attending: Radiation Oncology | Admitting: Radiation Oncology

## 2012-01-06 VITALS — BP 141/90 | HR 86 | Resp 18 | Wt 157.0 lb

## 2012-01-06 DIAGNOSIS — C50419 Malignant neoplasm of upper-outer quadrant of unspecified female breast: Secondary | ICD-10-CM

## 2012-01-06 NOTE — Progress Notes (Signed)
Weekly Management Note:  Site:R Breast/regional LNs Current Dose:  180  cGy Projected Dose: 6480  cGy  Narrative: The patient is seen today for routine under treatment assessment. CBCT/MVCT images/port films were reviewed. The chart was reviewed.   She developed severe low back pain after sneezing over the weekend. She saw her primary care physician who placed her on oxycodone and a muscle relaxer.  Physical Examination:  Filed Vitals:   01/06/12 1741  BP: 141/90  Pulse: 86  Resp: 18  .  Weight: 157 lb (71.215 kg). No skin changes.  Impression: Tolerating radiation therapy well.  Plan: Continue radiation therapy as planned.

## 2012-01-06 NOTE — Progress Notes (Signed)
Patient presents to the clinic tonight for PUT with Dr. Dayton Scrape. Patient is alert and oriented to person, place, and time. No distress noted. Steady gait noted. Pleasant affect noted. Patient reports occasional sharp shooting back pain 10 on a scale of 0-10 that she compares to labor. She reports that over the weekend she sneezed and messed up her back. Patient seen by PCP this morning and prescribed flexeril and oxycodone. Patient reports right breast is sore related to effects of surgery. Patient denies nipple discharge. Patient reports that her surgical incision from breast surgery are well approximated without redness, edema, or drainage. Reported all findings to Dr. Dayton Scrape.

## 2012-01-06 NOTE — Progress Notes (Signed)
Simulation verification note: The patient was similar to verification to her right breast and regional lymph nodes. Her isocenter is in good position and the multileaf collimators contoured the treatment volume appropriately.

## 2012-01-07 ENCOUNTER — Ambulatory Visit
Admission: RE | Admit: 2012-01-07 | Discharge: 2012-01-07 | Disposition: A | Discharge status: Home / Self Care | Payer: BC Managed Care – PPO | Source: Ambulatory Visit | Attending: Radiation Oncology | Admitting: Radiation Oncology

## 2012-01-07 DIAGNOSIS — C50919 Malignant neoplasm of unspecified site of unspecified female breast: Secondary | ICD-10-CM

## 2012-01-07 MED ORDER — RADIAPLEXRX EX GEL
Freq: Once | CUTANEOUS | Status: AC
  Administered 2012-01-07: 17:00:00 via TOPICAL

## 2012-01-07 MED ORDER — ALRA NON-METALLIC DEODORANT (RAD-ONC)
1.0000 "application " | Freq: Once | TOPICAL | Status: AC
  Administered 2012-01-07: 1 via TOPICAL

## 2012-01-07 NOTE — Progress Notes (Signed)
Post sim teaching,radiation therapy and you book, single sheet  flyer on skin products, alra deodorant,radiaplex gel given to patient, discussed side efects, sign/symptoms to report, sees MD/staffRN weekly and prn, when to use radiaplex gel and alra, not before 3-4 hours prior to treatment daily, patient voiced verbal understanding, teach back given .,no c.o breast pain 5:06 PM

## 2012-01-08 ENCOUNTER — Ambulatory Visit: Payer: BC Managed Care – PPO | Attending: General Surgery | Admitting: Physical Therapy

## 2012-01-08 ENCOUNTER — Ambulatory Visit
Admission: RE | Admit: 2012-01-08 | Discharge: 2012-01-08 | Disposition: A | Discharge status: Home / Self Care | Payer: BC Managed Care – PPO | Source: Ambulatory Visit | Attending: Radiation Oncology | Admitting: Radiation Oncology

## 2012-01-08 ENCOUNTER — Encounter: Payer: Self-pay | Admitting: Radiation Oncology

## 2012-01-08 DIAGNOSIS — M2560 Stiffness of unspecified joint, not elsewhere classified: Secondary | ICD-10-CM | POA: Insufficient documentation

## 2012-01-08 DIAGNOSIS — M545 Low back pain, unspecified: Secondary | ICD-10-CM | POA: Insufficient documentation

## 2012-01-08 DIAGNOSIS — IMO0001 Reserved for inherently not codable concepts without codable children: Secondary | ICD-10-CM | POA: Insufficient documentation

## 2012-01-09 ENCOUNTER — Ambulatory Visit
Admission: RE | Admit: 2012-01-09 | Discharge: 2012-01-09 | Disposition: A | Discharge status: Home / Self Care | Payer: BC Managed Care – PPO | Source: Ambulatory Visit | Attending: Radiation Oncology | Admitting: Radiation Oncology

## 2012-01-10 ENCOUNTER — Ambulatory Visit (HOSPITAL_BASED_OUTPATIENT_CLINIC_OR_DEPARTMENT_OTHER): Payer: BC Managed Care – PPO | Admitting: Family

## 2012-01-10 ENCOUNTER — Telehealth: Payer: Self-pay | Admitting: *Deleted

## 2012-01-10 ENCOUNTER — Encounter: Payer: Self-pay | Admitting: Family

## 2012-01-10 ENCOUNTER — Other Ambulatory Visit (HOSPITAL_BASED_OUTPATIENT_CLINIC_OR_DEPARTMENT_OTHER): Payer: BC Managed Care – PPO | Admitting: Lab

## 2012-01-10 ENCOUNTER — Ambulatory Visit
Admission: RE | Admit: 2012-01-10 | Discharge: 2012-01-10 | Disposition: A | Discharge status: Home / Self Care | Payer: BC Managed Care – PPO | Source: Ambulatory Visit | Attending: Radiation Oncology | Admitting: Radiation Oncology

## 2012-01-10 VITALS — BP 120/78 | HR 78 | Temp 99.1°F | Resp 20 | Ht 67.0 in | Wt 156.4 lb

## 2012-01-10 DIAGNOSIS — C50919 Malignant neoplasm of unspecified site of unspecified female breast: Secondary | ICD-10-CM

## 2012-01-10 DIAGNOSIS — M549 Dorsalgia, unspecified: Secondary | ICD-10-CM

## 2012-01-10 DIAGNOSIS — C50419 Malignant neoplasm of upper-outer quadrant of unspecified female breast: Secondary | ICD-10-CM

## 2012-01-10 DIAGNOSIS — Z170 Estrogen receptor positive status [ER+]: Secondary | ICD-10-CM

## 2012-01-10 DIAGNOSIS — C773 Secondary and unspecified malignant neoplasm of axilla and upper limb lymph nodes: Secondary | ICD-10-CM

## 2012-01-10 LAB — COMPREHENSIVE METABOLIC PANEL (CC13)
ALT: 12 U/L (ref 0–55)
AST: 15 U/L (ref 5–34)
Albumin: 3.9 g/dL (ref 3.5–5.0)
CO2: 27 mEq/L (ref 22–29)
Calcium: 9.1 mg/dL (ref 8.4–10.4)
Chloride: 103 mEq/L (ref 98–107)
Potassium: 4.1 mEq/L (ref 3.5–5.1)
Total Protein: 6.4 g/dL (ref 6.4–8.3)

## 2012-01-10 LAB — CBC WITH DIFFERENTIAL/PLATELET
BASO%: 0.7 % (ref 0.0–2.0)
EOS%: 4.8 % (ref 0.0–7.0)
HGB: 13.4 g/dL (ref 11.6–15.9)
MCH: 32.4 pg (ref 25.1–34.0)
MCHC: 33.8 g/dL (ref 31.5–36.0)
MONO#: 0.3 10*3/uL (ref 0.1–0.9)
RDW: 12 % (ref 11.2–14.5)
WBC: 4.2 10*3/uL (ref 3.9–10.3)
lymph#: 1 10*3/uL (ref 0.9–3.3)

## 2012-01-10 NOTE — Progress Notes (Signed)
Hematology and Oncology Follow Up Visit  Kelly Mills [id number] March 24, 1971 41 y.o.    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 She is has completed 4/4 planned cycles of neoadjuvant dose dense FEC with Neulasta support on day 2, followed by  4/4 cycles adjuvant dose dense Taxotere.  Interim History:   Kelly Mills returns today for followup. She is feeling well. She denies any difficulty with fevers, chills, night sweats. She denies any nausea, emesis, diarrhea or constipation issues. She denies a diffuse bone pain.  She has noted a bit more fatigue.  She has noted quite abit more eye tearing, and has been using lubricating eye drops.  No frank peripheral neuropathy symptoms or skin changes, but she has noted some nailbed sensitivity.Currently receiving radiation, will complete Oct. 15. Using Trazadone for sleep with good results. Pulled her back early in the week, this has happened in the past and eventually improves without intervention. She is taking Flexeril with some relief. No self detected breast problems or complaints.  A detailed review of systems is otherwise noncontributory.  Medications:   I have reviewed the patient's current medications.  Allergies:  Allergies  Allergen Reactions  . Codeine Nausea Only    Physical Exam: Filed Vitals:   01/10/12 0907  BP: 120/78  Pulse: 78  Temp: 99.1 F (37.3 C)  Resp: 20    Body mass index is 24.50 kg/(m^2). Weight: 159 lbs. HEENT:  Sclerae anicteric, conjunctivae pink.  Oropharynx clear.  No mucositis or candidiasis.   Nodes:  No cervical, supraclavicular, or axillary lymphadenopathy palpated.  Breast Exam: Performed by Dr. Donnie Coffin. Lungs:  Clear to auscultation bilaterally.  No crackles, rhonchi, or wheezes.   Heart:  Regular rate and rhythm.   Abdomen:  Soft, nontender.  Positive bowel sounds.  No  organomegaly or masses palpated.   Musculoskeletal:  No focal spinal tenderness to palpation.  Extremities:  Benign.  No peripheral edema or cyanosis.  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   Skin:  Benign.   Neuro:  Nonfocal, alert and oriented x 3.  Lab Results  Component Value Date   WBC 4.2 01/10/2012   HGB 13.4 01/10/2012   HCT 39.7 01/10/2012   MCV 95.9 01/10/2012   PLT 159 01/10/2012    Assessment:  1. Left breast cancer, completed chemo and surgery. Now on radiation therapy with good tolerance. 2. Mild nail bed changes secondary to Taxotere based therapy. 3. Excessive eye tearing secondary to therapyTaxotere-based, improved.  4. Back pain, on  Flexeril per primary care.   Plan:  1. Return in 5 weeks to see Dr. Donnie Coffin close to the completion of radiation therapy.  2. She will need Tamoxifen prescription. We discussed the mechanism of action and expected side effects.     Kace Hartje, FNP-C

## 2012-01-10 NOTE — Telephone Encounter (Signed)
Patient stated that the np stated she should md after radiation per the orders says five weeks that would put the patient on for 02-14-2012 patient finishes radiation on10-15-2013 gave patient appointment for 03-06-2012 at 9:00am

## 2012-01-10 NOTE — Patient Instructions (Signed)
Return in 5 weeks to see Dr. Donnie Coffin.

## 2012-01-14 ENCOUNTER — Ambulatory Visit
Admission: RE | Admit: 2012-01-14 | Discharge: 2012-01-14 | Disposition: A | Discharge status: Home / Self Care | Payer: BC Managed Care – PPO | Source: Ambulatory Visit | Attending: Radiation Oncology | Admitting: Radiation Oncology

## 2012-01-14 ENCOUNTER — Encounter: Payer: Self-pay | Admitting: Radiation Oncology

## 2012-01-14 VITALS — BP 148/89 | HR 62 | Temp 98.0°F | Resp 20 | Wt 159.1 lb

## 2012-01-14 DIAGNOSIS — C50419 Malignant neoplasm of upper-outer quadrant of unspecified female breast: Secondary | ICD-10-CM

## 2012-01-14 NOTE — Progress Notes (Signed)
Patient alert,oriented x3, weekly rad onc, completed  Rad txs, 6/28, very slight erythema barely seen, using radiplex gel, no c/o pain 3:43 PM

## 2012-01-14 NOTE — Progress Notes (Signed)
Weekly Management Note:  Site:R Breast/regional LNs Current Dose:  1080  cGy Projected Dose: 6480  cGy  Narrative: The patient is seen today for routine under treatment assessment. CBCT/MVCT images/port films were reviewed. The chart was reviewed.   No complaints today. She is using Radioplex gel when necessary.  Physical Examination:  Filed Vitals:   01/14/12 1546  BP: 148/89  Pulse: 62  Temp: 98 F (36.7 C)  Resp: 20  .  Weight: 159 lb 1.6 oz (72.167 kg). Faint erythema noted along the right breast/axilla. No desquamation.  Impression: Tolerating radiation therapy well.  Plan: Continue radiation therapy as planned.

## 2012-01-15 ENCOUNTER — Ambulatory Visit
Admission: RE | Admit: 2012-01-15 | Discharge: 2012-01-15 | Disposition: A | Discharge status: Home / Self Care | Payer: BC Managed Care – PPO | Source: Ambulatory Visit | Attending: Radiation Oncology | Admitting: Radiation Oncology

## 2012-01-15 ENCOUNTER — Ambulatory Visit: Payer: BC Managed Care – PPO | Attending: General Surgery | Admitting: Physical Therapy

## 2012-01-15 DIAGNOSIS — M2560 Stiffness of unspecified joint, not elsewhere classified: Secondary | ICD-10-CM | POA: Insufficient documentation

## 2012-01-15 DIAGNOSIS — M545 Low back pain, unspecified: Secondary | ICD-10-CM | POA: Insufficient documentation

## 2012-01-15 DIAGNOSIS — IMO0001 Reserved for inherently not codable concepts without codable children: Secondary | ICD-10-CM | POA: Insufficient documentation

## 2012-01-16 ENCOUNTER — Ambulatory Visit
Admission: RE | Admit: 2012-01-16 | Discharge: 2012-01-16 | Disposition: A | Discharge status: Home / Self Care | Payer: BC Managed Care – PPO | Source: Ambulatory Visit | Attending: Radiation Oncology | Admitting: Radiation Oncology

## 2012-01-17 ENCOUNTER — Ambulatory Visit: Payer: BC Managed Care – PPO | Admitting: Physical Therapy

## 2012-01-17 ENCOUNTER — Ambulatory Visit
Admission: RE | Admit: 2012-01-17 | Discharge: 2012-01-17 | Disposition: A | Discharge status: Home / Self Care | Payer: BC Managed Care – PPO | Source: Ambulatory Visit | Attending: Radiation Oncology | Admitting: Radiation Oncology

## 2012-01-20 ENCOUNTER — Encounter: Payer: Self-pay | Admitting: Radiation Oncology

## 2012-01-20 ENCOUNTER — Ambulatory Visit
Admission: RE | Admit: 2012-01-20 | Discharge: 2012-01-20 | Disposition: A | Discharge status: Home / Self Care | Payer: BC Managed Care – PPO | Source: Ambulatory Visit | Attending: Radiation Oncology | Admitting: Radiation Oncology

## 2012-01-20 VITALS — BP 156/90 | HR 50 | Temp 98.5°F | Resp 20 | Wt 156.5 lb

## 2012-01-20 DIAGNOSIS — C50419 Malignant neoplasm of upper-outer quadrant of unspecified female breast: Secondary | ICD-10-CM

## 2012-01-20 NOTE — Progress Notes (Signed)
Patient completed 10/34 right breast, erythenma , using radiaplex gel bid, has skipped heart beats when l;ying on table, counted 50 beats 1 minute  No pain 3:19 PM

## 2012-01-20 NOTE — Progress Notes (Signed)
Weekly Management Note:  Site:R Breast/regional LNs Current Dose:  1800  cGy Projected Dose: 6480  cGy  Narrative: The patient is seen today for routine under treatment assessment. CBCT/MVCT images/port films were reviewed. The chart was reviewed.   No complaints today except for "skipped heartbeats". She had is while she is undergoing chemotherapy as well, Dr. Donnie Coffin was notified.  Physical Examination:  Filed Vitals:   01/20/12 1515  BP: 156/90  Pulse: 50  Temp: 98.5 F (36.9 C)  Resp: 20  .  Weight: 156 lb 8 oz (70.988 kg). Her pulse is regular and she does have a few ectopic beats per minute. Faint erythema of the skin within her treatment fields.  Impression: Tolerating radiation therapy well. She is to contact Dr. Renelda Loma office regarding her ectopic beats.  Plan: Continue radiation therapy as planned.

## 2012-01-21 ENCOUNTER — Ambulatory Visit
Admission: RE | Admit: 2012-01-21 | Discharge: 2012-01-21 | Disposition: A | Discharge status: Home / Self Care | Payer: BC Managed Care – PPO | Source: Ambulatory Visit | Attending: Radiation Oncology | Admitting: Radiation Oncology

## 2012-01-22 ENCOUNTER — Ambulatory Visit
Admission: RE | Admit: 2012-01-22 | Discharge: 2012-01-22 | Disposition: A | Discharge status: Home / Self Care | Payer: BC Managed Care – PPO | Source: Ambulatory Visit | Attending: Radiation Oncology | Admitting: Radiation Oncology

## 2012-01-22 ENCOUNTER — Ambulatory Visit: Payer: BC Managed Care – PPO | Admitting: Physical Therapy

## 2012-01-23 ENCOUNTER — Ambulatory Visit
Admission: RE | Admit: 2012-01-23 | Discharge: 2012-01-23 | Disposition: A | Discharge status: Home / Self Care | Payer: BC Managed Care – PPO | Source: Ambulatory Visit | Attending: Radiation Oncology | Admitting: Radiation Oncology

## 2012-01-24 ENCOUNTER — Ambulatory Visit: Payer: BC Managed Care – PPO | Admitting: Physical Therapy

## 2012-01-24 ENCOUNTER — Ambulatory Visit
Admission: RE | Admit: 2012-01-24 | Discharge: 2012-01-24 | Disposition: A | Discharge status: Home / Self Care | Payer: BC Managed Care – PPO | Source: Ambulatory Visit | Attending: Radiation Oncology | Admitting: Radiation Oncology

## 2012-01-27 ENCOUNTER — Ambulatory Visit
Admission: RE | Admit: 2012-01-27 | Discharge: 2012-01-27 | Disposition: A | Discharge status: Home / Self Care | Payer: BC Managed Care – PPO | Source: Ambulatory Visit | Attending: Radiation Oncology | Admitting: Radiation Oncology

## 2012-01-27 VITALS — BP 155/85 | HR 69 | Temp 97.8°F | Resp 20 | Wt 154.5 lb

## 2012-01-27 DIAGNOSIS — C50419 Malignant neoplasm of upper-outer quadrant of unspecified female breast: Secondary | ICD-10-CM

## 2012-01-27 NOTE — Progress Notes (Signed)
Weekly Management Note:  Site:R Breast/regional LNs Current Dose:  2700  cGy Projected Dose: 6480  cGy  Narrative: The patient is seen today for routine under treatment assessment. CBCT/MVCT images/port films were reviewed. The chart was reviewed.   No new complaints today. She uses Radioplex gel. No significant fatigue.  Physical Examination:  Filed Vitals:   01/27/12 1519  BP: 155/85  Pulse: 69  Temp: 97.8 F (36.6 C)  Resp: 20  .  Weight: 154 lb 8 oz (70.081 kg). There is slight erythema the skin along the right breast and regional lymph nodes. No areas of desquamation.  Impression: Tolerating radiation therapy well.  Plan: Continue radiation therapy as planned.

## 2012-01-27 NOTE — Progress Notes (Signed)
Weekly rad txs:15/36 completed, erythema on right breast/axilla area, skin intact, no c/opain, using radiplex gel daily 3:18 PM

## 2012-01-28 ENCOUNTER — Ambulatory Visit
Admission: RE | Admit: 2012-01-28 | Discharge: 2012-01-28 | Disposition: A | Discharge status: Home / Self Care | Payer: BC Managed Care – PPO | Source: Ambulatory Visit | Attending: Radiation Oncology | Admitting: Radiation Oncology

## 2012-01-29 ENCOUNTER — Encounter: Payer: BC Managed Care – PPO | Admitting: Physical Therapy

## 2012-01-29 ENCOUNTER — Ambulatory Visit
Admission: RE | Admit: 2012-01-29 | Discharge: 2012-01-29 | Disposition: A | Discharge status: Home / Self Care | Payer: BC Managed Care – PPO | Source: Ambulatory Visit | Attending: Radiation Oncology | Admitting: Radiation Oncology

## 2012-01-29 ENCOUNTER — Telehealth: Payer: Self-pay | Admitting: Family

## 2012-01-29 NOTE — Telephone Encounter (Signed)
Asked if she could be seen tomorrow by me or Dr. Donnie Coffin in conjunction with her radiation therapy appt. I explained that neither of [place] are in the office tomorrow. I encouraged her to speak with Dr. Dayton Scrape, but that he may prefer that she see her primary care who has managed her sinus infections in the past.

## 2012-01-30 ENCOUNTER — Ambulatory Visit
Admission: RE | Admit: 2012-01-30 | Discharge: 2012-01-30 | Disposition: A | Discharge status: Home / Self Care | Payer: BC Managed Care – PPO | Source: Ambulatory Visit | Attending: Radiation Oncology | Admitting: Radiation Oncology

## 2012-01-31 ENCOUNTER — Ambulatory Visit
Admission: RE | Admit: 2012-01-31 | Discharge: 2012-01-31 | Disposition: A | Discharge status: Home / Self Care | Payer: BC Managed Care – PPO | Source: Ambulatory Visit | Attending: Radiation Oncology | Admitting: Radiation Oncology

## 2012-01-31 ENCOUNTER — Encounter: Payer: BC Managed Care – PPO | Admitting: Physical Therapy

## 2012-02-03 ENCOUNTER — Ambulatory Visit
Admission: RE | Admit: 2012-02-03 | Discharge: 2012-02-03 | Disposition: A | Discharge status: Home / Self Care | Payer: BC Managed Care – PPO | Source: Ambulatory Visit | Attending: Radiation Oncology | Admitting: Radiation Oncology

## 2012-02-03 DIAGNOSIS — C50419 Malignant neoplasm of upper-outer quadrant of unspecified female breast: Secondary | ICD-10-CM

## 2012-02-03 NOTE — Progress Notes (Signed)
Patient seen in the back by MD,not sent to nursing ok per MD 2:56 PM

## 2012-02-03 NOTE — Progress Notes (Signed)
Weekly Management Note:  Site:R Breast/regional LNs Current Dose:  3600  cGy Projected Dose: 6480  cGy  Narrative: The patient is seen today for routine under treatment assessment. CBCT/MVCT images/port films were reviewed. The chart was reviewed.   No new complaints today. She continues with Radioplex gel.  Physical Examination: There were no vitals filed for this visit..  Weight:  . No significant change. No areas of desquamation .  Impression: Tolerating radiation therapy well.  Plan: Continue radiation therapy as planned.

## 2012-02-04 ENCOUNTER — Ambulatory Visit
Admission: RE | Admit: 2012-02-04 | Discharge: 2012-02-04 | Disposition: A | Discharge status: Home / Self Care | Payer: BC Managed Care – PPO | Source: Ambulatory Visit | Attending: Radiation Oncology | Admitting: Radiation Oncology

## 2012-02-05 ENCOUNTER — Encounter: Payer: BC Managed Care – PPO | Admitting: Physical Therapy

## 2012-02-05 ENCOUNTER — Ambulatory Visit: Admission: RE | Admit: 2012-02-05 | Payer: BC Managed Care – PPO | Source: Ambulatory Visit

## 2012-02-05 ENCOUNTER — Encounter: Payer: Self-pay | Admitting: Radiation Oncology

## 2012-02-05 NOTE — Progress Notes (Signed)
Simulation note: The patient underwent virtual simulation for her right breast boost. She was set up to a 3 field technique. 3 separate multileaf collimators were designed to conform the field. I prescribing 1440 cGy in 8 sessions utilizing 6 MV photons. I chose the 100% isodose curve as the prescription dose. Dosimetry and an isodose plan were requested.

## 2012-02-06 ENCOUNTER — Ambulatory Visit
Admission: RE | Admit: 2012-02-06 | Discharge: 2012-02-06 | Disposition: A | Discharge status: Home / Self Care | Payer: BC Managed Care – PPO | Source: Ambulatory Visit | Attending: Radiation Oncology | Admitting: Radiation Oncology

## 2012-02-07 ENCOUNTER — Encounter: Payer: BC Managed Care – PPO | Admitting: Physical Therapy

## 2012-02-07 ENCOUNTER — Ambulatory Visit
Admission: RE | Admit: 2012-02-07 | Discharge: 2012-02-07 | Disposition: A | Discharge status: Home / Self Care | Payer: BC Managed Care – PPO | Source: Ambulatory Visit | Attending: Radiation Oncology | Admitting: Radiation Oncology

## 2012-02-10 ENCOUNTER — Ambulatory Visit
Admission: RE | Admit: 2012-02-10 | Discharge: 2012-02-10 | Disposition: A | Discharge status: Home / Self Care | Payer: BC Managed Care – PPO | Source: Ambulatory Visit | Attending: Radiation Oncology | Admitting: Radiation Oncology

## 2012-02-10 ENCOUNTER — Encounter: Payer: Self-pay | Admitting: Radiation Oncology

## 2012-02-10 VITALS — BP 122/86 | HR 70 | Temp 98.6°F | Resp 20 | Wt 152.9 lb

## 2012-02-10 DIAGNOSIS — C50419 Malignant neoplasm of upper-outer quadrant of unspecified female breast: Secondary | ICD-10-CM

## 2012-02-10 NOTE — Progress Notes (Signed)
Patient here weekly rad right breast Kelly Mills txs:24/36 completed Using radiaplex gel bid, erythema , skin intact, c/o twinges hurting throughout breast at times

## 2012-02-10 NOTE — Progress Notes (Signed)
Weekly Management Note:  Site:R Breast/regional LNs Current Dose:  4320  cGy Projected Dose: 6480  cGy  Narrative: The patient is seen today for routine under treatment assessment. CBCT/MVCT images/port films were reviewed. The chart was reviewed.   No complaints today. She uses Radioplex gel. We are giving her treatment twice a day on a few occasions because of missed treatments as a result of conflicts with work.  Physical Examination:  Filed Vitals:   02/10/12 1502  BP: 122/86  Pulse: 70  Temp: 98.6 F (37 C)  Resp: 20  .  Weight: 152 lb 14.4 oz (69.355 kg). There is erythema of skin along her right breast and clavicular region with patchy dry desquamation. No areas of moist desquamation.  Impression: Tolerating radiation therapy well.  Plan: Continue radiation therapy as planned.

## 2012-02-11 ENCOUNTER — Ambulatory Visit: Payer: BC Managed Care – PPO

## 2012-02-12 ENCOUNTER — Ambulatory Visit
Admission: RE | Admit: 2012-02-12 | Discharge: 2012-02-12 | Disposition: A | Discharge status: Home / Self Care | Payer: BC Managed Care – PPO | Source: Ambulatory Visit | Attending: Radiation Oncology | Admitting: Radiation Oncology

## 2012-02-13 ENCOUNTER — Ambulatory Visit
Admission: RE | Admit: 2012-02-13 | Discharge: 2012-02-13 | Disposition: A | Discharge status: Home / Self Care | Payer: BC Managed Care – PPO | Source: Ambulatory Visit | Attending: Radiation Oncology | Admitting: Radiation Oncology

## 2012-02-14 ENCOUNTER — Ambulatory Visit
Admission: RE | Admit: 2012-02-14 | Discharge: 2012-02-14 | Disposition: A | Discharge status: Home / Self Care | Payer: BC Managed Care – PPO | Source: Ambulatory Visit | Attending: Radiation Oncology | Admitting: Radiation Oncology

## 2012-02-17 ENCOUNTER — Ambulatory Visit
Admission: RE | Admit: 2012-02-17 | Discharge: 2012-02-17 | Disposition: A | Discharge status: Home / Self Care | Payer: BC Managed Care – PPO | Source: Ambulatory Visit | Attending: Radiation Oncology | Admitting: Radiation Oncology

## 2012-02-17 ENCOUNTER — Encounter: Payer: Self-pay | Admitting: Radiation Oncology

## 2012-02-17 VITALS — BP 129/90 | HR 72 | Temp 97.8°F | Resp 20 | Wt 155.1 lb

## 2012-02-17 DIAGNOSIS — C50419 Malignant neoplasm of upper-outer quadrant of unspecified female breast: Secondary | ICD-10-CM

## 2012-02-17 NOTE — Progress Notes (Signed)
Simulation verification note: Kelly Mills underwent simulation verification for her right breast boost. Her isocenter is in good position and the multileaf collimators contoured the treatment volume appropriately .

## 2012-02-17 NOTE — Progress Notes (Signed)
Weekly Management Note:  Site:R Breast/regional LNs Current Dose:  5220  cGy Projected Dose: 6480  cGy  Narrative: The patient is seen today for routine under treatment assessment. CBCT/MVCT images/port films were reviewed. The chart was reviewed.   She is doing well although she does have right breast and axillary discomfort as expected. She takes ibuprofen when necessary. She maintains a busy work schedule. She uses Radioplex gel.  Physical Examination:  Filed Vitals:   02/17/12 1542  BP: 129/90  Pulse: 72  Temp: 97.8 F (36.6 C)  Resp: 20  .  Weight: 155 lb 1.6 oz (70.353 kg). There is diffuse erythema along her right breast and axilla along with erythema along her right clavicle with dry desquamation.  Impression: Tolerating radiation therapy well.  Plan: Continue radiation therapy as planned.

## 2012-02-17 NOTE — Progress Notes (Signed)
Patient here weekly rad txs, completed 28/28 right breast now on her boost 1/8 ciompleted, dry desquamtion right clavicular area, erythema on breast, patient taking 400 mg Ibuprofen prn pain, hurting a lot , not much relief, alert,oriented x3, 3:47 PM

## 2012-02-18 ENCOUNTER — Ambulatory Visit
Admission: RE | Admit: 2012-02-18 | Discharge: 2012-02-18 | Disposition: A | Discharge status: Home / Self Care | Payer: BC Managed Care – PPO | Source: Ambulatory Visit | Attending: Radiation Oncology | Admitting: Radiation Oncology

## 2012-02-19 ENCOUNTER — Ambulatory Visit
Admission: RE | Admit: 2012-02-19 | Discharge: 2012-02-19 | Disposition: A | Discharge status: Home / Self Care | Payer: BC Managed Care – PPO | Source: Ambulatory Visit | Attending: Radiation Oncology | Admitting: Radiation Oncology

## 2012-02-20 ENCOUNTER — Ambulatory Visit
Admission: RE | Admit: 2012-02-20 | Discharge: 2012-02-20 | Disposition: A | Discharge status: Home / Self Care | Payer: BC Managed Care – PPO | Source: Ambulatory Visit | Attending: Radiation Oncology | Admitting: Radiation Oncology

## 2012-02-21 ENCOUNTER — Ambulatory Visit
Admission: RE | Admit: 2012-02-21 | Discharge: 2012-02-21 | Disposition: A | Discharge status: Home / Self Care | Payer: BC Managed Care – PPO | Source: Ambulatory Visit | Attending: Radiation Oncology | Admitting: Radiation Oncology

## 2012-02-24 ENCOUNTER — Ambulatory Visit
Admission: RE | Admit: 2012-02-24 | Discharge: 2012-02-24 | Disposition: A | Discharge status: Home / Self Care | Payer: BC Managed Care – PPO | Source: Ambulatory Visit | Attending: Radiation Oncology | Admitting: Radiation Oncology

## 2012-02-24 ENCOUNTER — Encounter: Payer: Self-pay | Admitting: Radiation Oncology

## 2012-02-24 VITALS — BP 137/89 | HR 60 | Temp 98.1°F | Resp 20 | Wt 152.6 lb

## 2012-02-24 DIAGNOSIS — C50419 Malignant neoplasm of upper-outer quadrant of unspecified female breast: Secondary | ICD-10-CM

## 2012-02-24 NOTE — Progress Notes (Signed)
Patient here weekly rad txs right breast 7/8 boost comploetd, gave 1 month f/u appt, erythema , under axilla skin healing where it peeled, no pain, eating and doing well, usin radiaplex gel bid 4:44 PM

## 2012-02-24 NOTE — Progress Notes (Signed)
Weekly Management Note:  Site: Right breast boost Current Dose:  6300  cGy Projected Dose: 6480  cGy  Narrative: The patient is seen today for routine under treatment assessment. CBCT/MVCT images/port films were reviewed. The chart was reviewed.   She is treated twice a day today for having missed a day last week. She no longer takes ibuprofen for skin discomfort. She continues with Radioplex gel. She states that she could taste to have "strong heartbeats". She tells that Dr. Donnie Coffin plan on getting a post chemotherapy ECG, but has not been done yet.  Physical Examination:  Filed Vitals:   02/24/12 1642  BP: 137/89  Pulse: 60  Temp: 98.1 F (36.7 C)  Resp: 20  .  Weight: 152 lb 9.6 oz (69.219 kg). Moderate to severe erythema/hyperpigmentation of the skin along the right breast and regional lymph nodes. Her skin looks improved along the clavicular and inframammary regions.  Impression: Tolerating radiation therapy well. She may be having premature atrial contractions. I instructed her to contact Dr. Donnie Coffin for scheduling of an ECG.  Plan: Continue radiation therapy as planned. She is to finish her radiation therapy tomorrow.

## 2012-02-25 ENCOUNTER — Ambulatory Visit
Admission: RE | Admit: 2012-02-25 | Discharge: 2012-02-25 | Disposition: A | Discharge status: Home / Self Care | Payer: BC Managed Care – PPO | Source: Ambulatory Visit | Attending: Radiation Oncology | Admitting: Radiation Oncology

## 2012-02-25 ENCOUNTER — Ambulatory Visit: Payer: BC Managed Care – PPO

## 2012-02-26 ENCOUNTER — Encounter: Payer: Self-pay | Admitting: Radiation Oncology

## 2012-02-26 ENCOUNTER — Ambulatory Visit: Payer: BC Managed Care – PPO

## 2012-02-26 NOTE — Progress Notes (Signed)
[facility] Radiation Oncology End of Treatment Note  Name:Sabrea Q Freese  Date: 02/26/2012 ZOX:[id number] DOB:[birth year]   Status:outpatient    CC: Carilyn Goodpasture, PA  , Dr. [name]  REFERRING PHYSICIAN:   Dr. 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 LAO field to the right axilla/supraclavicular region with 10 MV photons, dose prescribed 3 centers depth. PA right axillary field to supplement the right axilla utilizing 10 MV photons to bring the   midaxillary dose to 4600 cGy in 28 sessions. Three-field right breast boost with 6 MV photons, oblique arrangement.          NARRATIVE:  The patient tolerated treatment reasonably well although she developed moderate radiation dermatitis along her clavicular region, axilla and inframammary region. She used Radioplex gel during her course of therapy.                          PLAN: Routine followup in one month. Patient instructed to call if questions or worsening complaints in interim.

## 2012-03-04 ENCOUNTER — Telehealth: Payer: Self-pay | Admitting: *Deleted

## 2012-03-04 NOTE — Telephone Encounter (Signed)
patient requested to move appointment to 04-10-2012 at 11:00am

## 2012-03-06 ENCOUNTER — Ambulatory Visit: Payer: BC Managed Care – PPO | Admitting: Oncology

## 2012-03-24 ENCOUNTER — Encounter: Payer: Self-pay | Admitting: Radiation Oncology

## 2012-03-25 ENCOUNTER — Encounter: Payer: Self-pay | Admitting: Radiation Oncology

## 2012-03-25 ENCOUNTER — Ambulatory Visit
Admission: RE | Admit: 2012-03-25 | Discharge: 2012-03-25 | Disposition: A | Discharge status: Home / Self Care | Payer: BC Managed Care – PPO | Source: Ambulatory Visit | Attending: Radiation Oncology | Admitting: Radiation Oncology

## 2012-03-25 VITALS — BP 120/81 | HR 89 | Temp 97.4°F | Resp 20 | Wt 159.3 lb

## 2012-03-25 DIAGNOSIS — C50419 Malignant neoplasm of upper-outer quadrant of unspecified female breast: Secondary | ICD-10-CM

## 2012-03-25 NOTE — Progress Notes (Signed)
Pt denies pain, fatigue, loss of appetite. 

## 2012-03-25 NOTE — Progress Notes (Addendum)
CC: Dr. Pierce Mills  Followup note:  Kelly Mills visits today approximately 1 month following completion of radiation therapy to her right breast and regional lymph nodes following conservative surgery and adjuvant chemotherapy in the management of her pathologic stage II A. (T1c N1 M0) invasive ductal carcinoma the right breast. She generally feels well, but she continues to have an ectopic heart beats. She tells [place] she is scheduled see [name] in 2 weeks at which time I believe that she'll be started on tamoxifen for her hormone receptor positive disease. She does not yet have an appointment to see [name].  Physical examination: Alert and oriented.  Wt Readings from Last 3 Encounters:  03/25/12 159 lb 4.8 oz (72.258 kg)  02/24/12 152 lb 9.6 oz (69.219 kg)  02/17/12 155 lb 1.6 oz (70.353 kg)   Temp Readings from Last 3 Encounters:  03/25/12 97.4 F (36.3 C) Oral  02/24/12 98.1 F (36.7 C) Oral  02/17/12 97.8 F (36.6 C) Oral   BP Readings from Last 3 Encounters:  03/25/12 120/81  02/24/12 137/89  02/17/12 129/90   Pulse Readings from Last 3 Encounters:  03/25/12 89  02/24/12 60  02/17/12 72   Head and neck examination: She is having regrowth of her hair. Nodes: Without palpable cervical, supraclavicular, or axillary lymphadenopathy. Chest: Lungs clear. Breasts: There is residual hyperpigmentation the skin along the right breast. There is mild thickening. No masses are appreciated. Left breast without masses or lesions. Abdomen without hepatomegaly. Extremities: Without edema.  Impression: Satisfactory progress. [name] will address with her adjuvant hormone therapy, and also evaluate her ectopic heart beats.  Plan: She will visit.to [name] for a followup visit in 2 weeks. I suspect that she will be placed on tamoxifen. She is concerned about quality-of-life be on tamoxifen, and I suggested that she discuss this with [name], perhaps attend "Feeling Your New Normal".  I feel she can wait until this coming sprain to obtain a baseline right breast mammogram and screening left breast mammogram. This can be arranged by [name] or [name]. I've not scheduled the patient for a formal followup visit, but I would more than happy to see her in the future should the need arise.

## 2012-04-07 ENCOUNTER — Other Ambulatory Visit: Payer: Self-pay | Admitting: *Deleted

## 2012-04-07 DIAGNOSIS — C50919 Malignant neoplasm of unspecified site of unspecified female breast: Secondary | ICD-10-CM

## 2012-04-10 ENCOUNTER — Other Ambulatory Visit (HOSPITAL_BASED_OUTPATIENT_CLINIC_OR_DEPARTMENT_OTHER): Payer: BC Managed Care – PPO | Admitting: Lab

## 2012-04-10 ENCOUNTER — Ambulatory Visit (HOSPITAL_BASED_OUTPATIENT_CLINIC_OR_DEPARTMENT_OTHER): Payer: BC Managed Care – PPO | Admitting: Oncology

## 2012-04-10 ENCOUNTER — Telehealth: Payer: Self-pay | Admitting: *Deleted

## 2012-04-10 VITALS — BP 129/83 | HR 74 | Temp 98.2°F | Resp 20 | Ht 67.0 in | Wt 154.9 lb

## 2012-04-10 DIAGNOSIS — Z170 Estrogen receptor positive status [ER+]: Secondary | ICD-10-CM

## 2012-04-10 DIAGNOSIS — I499 Cardiac arrhythmia, unspecified: Secondary | ICD-10-CM

## 2012-04-10 DIAGNOSIS — C50419 Malignant neoplasm of upper-outer quadrant of unspecified female breast: Secondary | ICD-10-CM

## 2012-04-10 DIAGNOSIS — R002 Palpitations: Secondary | ICD-10-CM

## 2012-04-10 DIAGNOSIS — C50919 Malignant neoplasm of unspecified site of unspecified female breast: Secondary | ICD-10-CM

## 2012-04-10 LAB — CBC WITH DIFFERENTIAL/PLATELET
BASO%: 0.6 % (ref 0.0–2.0)
Basophils Absolute: 0 10*3/uL (ref 0.0–0.1)
EOS%: 8.7 % — ABNORMAL HIGH (ref 0.0–7.0)
MCH: 32.1 pg (ref 25.1–34.0)
MCHC: 34.4 g/dL (ref 31.5–36.0)
MCV: 93.4 fL (ref 79.5–101.0)
MONO%: 8.5 % (ref 0.0–14.0)
RBC: 3.77 10*6/uL (ref 3.70–5.45)
RDW: 14.3 % (ref 11.2–14.5)

## 2012-04-10 LAB — COMPREHENSIVE METABOLIC PANEL (CC13)
ALT: 15 U/L (ref 0–55)
CO2: 27 mEq/L (ref 22–29)
Creatinine: 0.7 mg/dL (ref 0.6–1.1)
Total Bilirubin: 0.43 mg/dL (ref 0.20–1.20)

## 2012-04-10 LAB — LACTATE DEHYDROGENASE (CC13): LDH: 149 U/L (ref 125–245)

## 2012-04-10 LAB — CANCER ANTIGEN 27.29: CA 27.29: 16 U/mL (ref 0–39)

## 2012-04-10 MED ORDER — TAMOXIFEN CITRATE 20 MG PO TABS: 20.0000 mg | ORAL_TABLET | Freq: Every day | ORAL | Status: DC

## 2012-04-10 NOTE — Progress Notes (Signed)
Hematology and Oncology Follow Up Visit  Kelly Mills [id number] 03-22-1971 41 y.o.    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 She is has completed 4/4 planned cycles of neoadjuvant dose dense FEC with Neulasta support on day 2, followed by  4/4 cycles adjuvant dose dense Taxotere.Currently on xrt.  Interim History:   Kelly Mills returns today for followup. She is feeling well. She denies any difficulty with fevers, chills, night sweats. She denies any nausea, emesis, diarrhea or constipation issues. She denies a diffuse bone pain.  She has noted a bit more fatigue.  She notes palpitations and irregular HR and is concerned about this. A detailed review of systems is otherwise noncontributory.  Medications:   I have reviewed the patient's current medications.  Allergies:  Allergies  Allergen Reactions  . Codeine Nausea Only    Physical Exam: Filed Vitals:   04/10/12 1133  BP: 129/83  Pulse: 74  Temp: 98.2 F (36.8 C)  Resp: 20    Body mass index is 24.26 kg/(m^2). Weight: 159 lbs. HEENT:  Sclerae anicteric, conjunctivae pink.  Oropharynx clear.  No mucositis or candidiasis.   Nodes:  No cervical, supraclavicular, or axillary lymphadenopathy palpated.  Breast Exam:  Lungs:  Clear to auscultation bilaterally.  No crackles, rhonchi, or wheezes.   Heart:  Regular rate and rhythm.   Abdomen:  Soft, nontender.  Positive bowel sounds.  No organomegaly or masses palpated.   Musculoskeletal:  No focal spinal tenderness to palpation.  Extremities:  Benign.  No peripheral edema or cyanosis.  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   Skin:  Benign.   Neuro:  Nonfocal, alert and oriented x 3.  Lab Results  Component Value Date   WBC 5.2 04/10/2012   HGB 12.1 04/10/2012   HCT 35.2 04/10/2012   MCV 93.4 04/10/2012     PLT 174 04/10/2012    Assessment:  1. Left breast cancer, completed chemo and surgery. Now on radiation therapy with good tolerance. 2. Mild nail bed changes secondary to Taxotere based therapy. 3. Excessive eye tearing secondary to therapyTaxotere-based, improved.  4. Back pain, on  Flexeril per primary care.   Plan:  1. Return in 5 weeks to see Dr. Donnie Coffin close to the completion of radiation therapy.  2. She will need Tamoxifen prescription. We discussed the mechanism of action and expected side effects.     Pierce Crane, MD

## 2012-04-10 NOTE — Progress Notes (Signed)
Hematology and Oncology Follow Up Visit  ESMAY AMSPACHER [id number] 08-Aug-1970 41 y.o.    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 She is has completed 4/4 planned cycles of neoadjuvant dose dense FEC with Neulasta support on day 2, followed by  4/4 cycles adjuvant dose dense Taxotere.She completed xrt 6 weeks ago. 02/25/12.  Interim History:   Kelly Mills returns today for followup.she is doing well. She has no complaints apart from subjwective sense of palpitations and irrgeualr HR. Denies sob or CP.  A detailed review of systems is otherwise noncontributory.  Medications:   I have reviewed the patient's current medications.  Allergies:  Allergies  Allergen Reactions  . Codeine Nausea Only    Physical Exam: Filed Vitals:   04/10/12 1133  BP: 129/83  Pulse: 74  Temp: 98.2 F (36.8 C)  Resp: 20    Body mass index is 24.26 kg/(m^2). Weight: 159 lbs. Normal HR,  HEENT:  Sclerae anicteric, conjunctivae pink.  Oropharynx clear.  No mucositis or candidiasis.   Nodes:  No cervical, supraclavicular, or axillary lymphadenopathy palpated.   Lungs:  Clear to auscultation bilaterally.  No crackles, rhonchi, or wheezes.   Heart:  Regular rate and rhythm.  No irrgeultr beats. Abdomen:  Soft, nontender.  Positive bowel sounds.  No organomegaly or masses palpated.   Musculoskeletal:  No focal spinal tenderness to palpation.  Extremities:  Benign.  No peripheral edema or cyanosis.  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   Skin:  Benign.   Neuro:  Nonfocal, alert and oriented x 3.  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   Plan:    Patient is doing well. She is concerned about a regular heart rate, and a subjective sense of palpitations. I will refer her to cardiology. I do not suspect any significant etiology from this. I've also discuss adjuvant tamoxifen therapy with her. I discussed side effects. She will start taking this now. I will see her in 3 months time. We will get a followup mammogram in 4-6 months.    [name], md

## 2012-04-10 NOTE — Telephone Encounter (Signed)
Gave patient appointment for 08-11-2012 starting at 1:30pm

## 2012-04-14 ENCOUNTER — Telehealth: Payer: Self-pay | Admitting: *Deleted

## 2012-04-14 NOTE — Telephone Encounter (Signed)
Made patient appointment to see dr.bensihom on 04-27-2012

## 2012-04-27 ENCOUNTER — Encounter (HOSPITAL_COMMUNITY): Payer: Self-pay

## 2012-04-27 ENCOUNTER — Ambulatory Visit (HOSPITAL_COMMUNITY)
Admission: RE | Admit: 2012-04-27 | Discharge: 2012-04-27 | Disposition: A | Discharge status: Home / Self Care | Payer: BC Managed Care – PPO | Source: Ambulatory Visit | Attending: Internal Medicine | Admitting: Internal Medicine

## 2012-04-27 VITALS — BP 102/74 | HR 58 | Ht 67.0 in | Wt 154.4 lb

## 2012-04-27 DIAGNOSIS — R002 Palpitations: Secondary | ICD-10-CM

## 2012-04-27 NOTE — Patient Instructions (Addendum)
Your physician has requested that you have an echocardiogram. Echocardiography is a painless test that uses sound waves to create images of your heart. It provides your doctor with information about the size and shape of your heart and how well your heart's chambers and valves are working. This procedure takes approximately one hour. There are no restrictions for this procedure.  Your physician recommends that you schedule a follow-up appointment in: as needed  

## 2012-04-27 NOTE — Assessment & Plan Note (Signed)
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 Currently PVC burden doesn't seem overly high but yet they can be disconcerting. I have reassured her that these are benign however if they get more frequent would pace monitor to further evaluate morphology and quantify burden. Needs f/u echo after recent chemo to make sure heart remains structurally normal. Discussed need to reduce caffeine and improve sleep as possible.

## 2012-04-27 NOTE — Progress Notes (Signed)
Referring Physician: Dr. Donnie Coffin Primary Care: Dr. Yehuda Mao at [place].   Primary Cardiologist: none   HPI: Kelly Mills is a 41 y.o. female with history of breast cancer (ER/PR +, HER-2 -) treated with Neulasta, taxotere and radiation therapy.  Completed 02/25/12.  She will be on tamoxifen for 5 years.  She has no other past medical history.      She has been referred by Dr. Donnie Coffin for palpitations.  The palpitations/heart racing and "extra pounding beat" started during chemo therapy.  She states they do not limit her activity but they have continued despite chemo completion 5 months ago. She gets palpitations about twice a day. Feels like her heart pauses and then gets a big beat. Just a couple of beats and then goes away. No tachypalpitations. She will occasionally have lightheadedness and dizziness but no syncope - not necessarily related to palpitations.  She walks at least 1 mile a day and does yoga several times a week.  She had normal thyroid function per PCP last week.  She drinks a glass of wine 2-3x/week.  She drinks 2 cups of coffee a day.  Has problems with insomina and uses trazadone most nights to sleep. Has two kids 8 and 12.  Review of Systems: [y] = yes, [ ]  = no   General: Weight gain [ ] ; Weight loss [ ] ; Anorexia [ ] ; Fatigue [ ] ; Fever [ ] ; Chills [ ] ; Weakness [ ]   Cardiac: Chest pain/pressure [ ] ; Resting SOB [ ] ; Exertional SOB [ ] ; Orthopnea [ ] ; Pedal Edema [ ] ; Palpitations [ ] ; Syncope [ ] ; Presyncope [ ] ; Paroxysmal nocturnal dyspnea[ ]   Pulmonary: Cough [ ] ; Wheezing[ ] ; Hemoptysis[ ] ; Sputum [ ] ; Snoring [ ]   GI: Vomiting[ ] ; Dysphagia[ ] ; Melena[ ] ; Hematochezia [ ] ; Heartburn[ ] ; Abdominal pain [ ] ; Constipation [ ] ; Diarrhea [ ] ; BRBPR [ ]   GU: Hematuria[ ] ; Dysuria [ ] ; Nocturia[ ]   Vascular: Pain in legs with walking [ ] ; Pain in feet with lying flat [ ] ; Non-healing sores [ ] ; Stroke [ ] ; TIA [ ] ; Slurred speech [ ] ;  Neuro: Headaches[ ] ; Vertigo[ ] ; Seizures[ ] ;  Paresthesias[ ] ;Blurred vision [ ] ; Diplopia [ ] ; Vision changes [ ]   Ortho/Skin: Arthritis [ ] ; Joint pain [ ] ; Muscle pain [ ] ; Joint swelling [ ] ; Back Pain [ ] ; Rash [ ]   Psych: Depression[ ] ; Anxiety[ ]   Heme: Bleeding problems [ ] ; Clotting disorders [ ] ; Anemia [ ]   Endocrine: Diabetes [ ] ; Thyroid dysfunction[ ]    Past Medical History  Diagnosis Date  . Cancer 07/08/11    Right Breast  . History of chemotherapy     completed 11/13/11  . Breast cancer     right  . History of radiation therapy 01/06/12-02/25/12    right breast,5040 cgy/28 sessions, boost 1440 cGy/8 sessions    Current Outpatient Prescriptions  Medication Sig Dispense Refill  . Multiple Vitamin (MULTIVITAMIN) capsule Take 1 capsule by mouth daily.      . tamoxifen (NOLVADEX) 20 MG tablet Take 1 tablet (20 mg total) by mouth daily.  30 tablet  6  . TRAZODONE HCL PO Take by mouth as needed.      . zolpidem (AMBIEN) 5 MG tablet Take 1 tablet (5 mg total) by mouth at bedtime as needed.  30 tablet  0  . ibuprofen (ADVIL,MOTRIN) 200 MG tablet Take 200 mg by mouth every 6 (six) hours as needed.  Allergies  Allergen Reactions  . Codeine Nausea Only    History   Social History  . Marital Status: Married    Spouse Name: N/A    Number of Children: N/A  . Years of Education: N/A   Occupational History  . 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   Social History Main Topics  . Smoking status: Never Smoker   . Smokeless tobacco: Never Used  . Alcohol Use: Yes     Comment: socially  . Drug Use: No  . Sexually Active:      Comment: menarche age 56, 1st pregnancy age 39   Other Topics Concern  . Not on file   Social History Narrative  . No narrative on file    Family History  Problem Relation Age of Onset  . Cancer Maternal Grandfather     melanoma  . Anesthesia problems Neg Hx   . Diabetes Paternal Grandfather     PHYSICAL EXAM: Filed Vitals:   04/27/12 0932  BP: 102/74  Pulse: 58  Height: 5\' 7"  (1.702 m)   Weight: 154 lb 6.4 oz (70.035 kg)  SpO2: 100%    General:  Well appearing. No respiratory difficulty HEENT: normal Neck: supple. no JVD. Carotids 2+ bilat; no bruits. No lymphadenopathy or thryomegaly appreciated. Cor: PMI nondisplaced. Regular rate & rhythm. No rubs, gallops or murmurs. Occasional ectopy Lungs: clear Abdomen: soft, nontender, nondistended. No hepatosplenomegaly. No bruits or masses. Good bowel sounds. Extremities: no cyanosis, clubbing, rash, edema Neuro: alert & oriented x 3, cranial nerves grossly intact. moves all 4 extremities w/o difficulty. Affect pleasant.  ECG: SB 57. Normal axis and intervals. No delta wave.      ASSESSMENT & PLAN:

## 2012-04-27 NOTE — Addendum Note (Signed)
Encounter addended by: Noralee Space, RN on: 04/27/2012 10:26 AM<BR>     Documentation filed: Patient Instructions Section, Orders

## 2012-04-28 NOTE — Addendum Note (Signed)
Encounter addended by: Cresenciano Genre on: 04/28/2012 10:30 AM<BR>     Documentation filed: Charges VN

## 2012-04-29 ENCOUNTER — Other Ambulatory Visit: Payer: Self-pay | Admitting: Family Medicine

## 2012-04-29 ENCOUNTER — Ambulatory Visit (HOSPITAL_COMMUNITY)
Admission: RE | Admit: 2012-04-29 | Discharge: 2012-04-29 | Disposition: A | Discharge status: Home / Self Care | Payer: BC Managed Care – PPO | Source: Ambulatory Visit | Attending: Internal Medicine | Admitting: Internal Medicine

## 2012-04-29 ENCOUNTER — Ambulatory Visit
Admission: RE | Admit: 2012-04-29 | Discharge: 2012-04-29 | Disposition: A | Discharge status: Home / Self Care | Payer: BC Managed Care – PPO | Source: Ambulatory Visit | Attending: Family Medicine | Admitting: Family Medicine

## 2012-04-29 DIAGNOSIS — R002 Palpitations: Secondary | ICD-10-CM

## 2012-04-29 DIAGNOSIS — M79673 Pain in unspecified foot: Secondary | ICD-10-CM

## 2012-04-29 DIAGNOSIS — D4989 Neoplasm of unspecified behavior of other specified sites: Secondary | ICD-10-CM

## 2012-04-29 DIAGNOSIS — C50919 Malignant neoplasm of unspecified site of unspecified female breast: Secondary | ICD-10-CM | POA: Insufficient documentation

## 2012-04-29 NOTE — Progress Notes (Signed)
  Echocardiogram 2D Echocardiogram has been performed.  Joquan Lotz 04/29/2012, 9:04 AM

## 2012-06-20 ENCOUNTER — Telehealth: Payer: Self-pay | Admitting: *Deleted

## 2012-06-20 ENCOUNTER — Encounter: Payer: Self-pay | Admitting: *Deleted

## 2012-06-20 NOTE — Telephone Encounter (Signed)
Per patient reassignment I have contacted the patient. I have explained that Dr.Rubin is no longer with the practice, but reviewed her chart before she left and wants Dr. Darnelle Catalan to follow up with her care. Patient agrees and appts made. Letter mailed.   JMW

## 2012-07-02 ENCOUNTER — Telehealth: Payer: Self-pay | Admitting: Oncology

## 2012-07-02 NOTE — Telephone Encounter (Signed)
pt requested to move est..Marland KitchenMarland KitchenDone

## 2012-07-03 ENCOUNTER — Encounter (INDEPENDENT_AMBULATORY_CARE_PROVIDER_SITE_OTHER): Payer: BC Managed Care – PPO | Admitting: General Surgery

## 2012-07-10 ENCOUNTER — Ambulatory Visit: Payer: BC Managed Care – PPO | Admitting: Oncology

## 2012-07-10 ENCOUNTER — Other Ambulatory Visit: Payer: BC Managed Care – PPO | Admitting: Lab

## 2012-08-07 ENCOUNTER — Ambulatory Visit (INDEPENDENT_AMBULATORY_CARE_PROVIDER_SITE_OTHER): Payer: BC Managed Care – PPO | Admitting: General Surgery

## 2012-08-07 ENCOUNTER — Encounter (INDEPENDENT_AMBULATORY_CARE_PROVIDER_SITE_OTHER): Payer: Self-pay | Admitting: General Surgery

## 2012-08-07 VITALS — BP 130/78 | HR 68 | Temp 97.2°F | Resp 12 | Ht 67.0 in | Wt 151.0 lb

## 2012-08-07 DIAGNOSIS — C50419 Malignant neoplasm of upper-outer quadrant of unspecified female breast: Secondary | ICD-10-CM

## 2012-08-07 DIAGNOSIS — C50411 Malignant neoplasm of upper-outer quadrant of right female breast: Secondary | ICD-10-CM

## 2012-08-09 NOTE — Progress Notes (Signed)
Subjective:     Patient ID: Kelly Mills, female   DOB: 06-28-1970, 42 y.o.   MRN: [id number]  HPI 34 yof who was diagnosed with right breast cancer and subsequently underwent primary chemo with FEC. This was followed by right lumpectomy/alnd. She underwent xrt and is now on tamoxifen. She really has done very well and has returned to most of her normal activities.  She is working and caring for her family.  She is very active.  She does not report any real problems or concerns today. Should be due for mm this summer. She does not have any symptoms of lymphedema in her right arm or shoulder difficulties.  Review of Systems     Objective:   Physical Exam  Vitals reviewed. Constitutional: She appears well-developed and well-nourished.  Pulmonary/Chest: Right breast exhibits no inverted nipple, no mass, no nipple discharge, no skin change and no tenderness. Left breast exhibits no inverted nipple, no mass, no nipple discharge, no skin change and no tenderness. Breasts are symmetrical.    Lymphadenopathy:    She has no cervical adenopathy.    She has no axillary adenopathy.       Right: No supraclavicular adenopathy present.       Left: No supraclavicular adenopathy present.       Assessment:     History breast cancer    Plan:     She has no clinical evidence of recurrence.  She is really doing well. We discussed continuing her monthly sbe, q 6 month clinical exams and annual mm which she will be due for soon.  I asked her to see me in one year or sooner if she needs anything or has concerns.

## 2012-08-11 ENCOUNTER — Ambulatory Visit: Payer: BC Managed Care – PPO | Admitting: Oncology

## 2012-08-11 ENCOUNTER — Other Ambulatory Visit: Payer: BC Managed Care – PPO | Admitting: Lab

## 2012-08-13 ENCOUNTER — Ambulatory Visit: Payer: BC Managed Care – PPO | Admitting: Family

## 2012-08-18 ENCOUNTER — Ambulatory Visit (HOSPITAL_BASED_OUTPATIENT_CLINIC_OR_DEPARTMENT_OTHER): Payer: BC Managed Care – PPO | Admitting: Lab

## 2012-08-18 ENCOUNTER — Encounter: Payer: Self-pay | Admitting: Family

## 2012-08-18 ENCOUNTER — Telehealth: Payer: Self-pay | Admitting: *Deleted

## 2012-08-18 ENCOUNTER — Ambulatory Visit (HOSPITAL_BASED_OUTPATIENT_CLINIC_OR_DEPARTMENT_OTHER): Payer: BC Managed Care – PPO | Admitting: Family

## 2012-08-18 VITALS — BP 127/86 | HR 88 | Temp 98.3°F | Resp 20 | Ht 67.0 in | Wt 150.7 lb

## 2012-08-18 DIAGNOSIS — C50419 Malignant neoplasm of upper-outer quadrant of unspecified female breast: Secondary | ICD-10-CM

## 2012-08-18 DIAGNOSIS — C50411 Malignant neoplasm of upper-outer quadrant of right female breast: Secondary | ICD-10-CM

## 2012-08-18 DIAGNOSIS — G4700 Insomnia, unspecified: Secondary | ICD-10-CM

## 2012-08-18 LAB — CBC WITH DIFFERENTIAL/PLATELET
BASO%: 0.4 % (ref 0.0–2.0)
Eosinophils Absolute: 0.2 10*3/uL (ref 0.0–0.5)
MONO#: 0.6 10*3/uL (ref 0.1–0.9)
MONO%: 8.3 % (ref 0.0–14.0)
NEUT#: 4.8 10*3/uL (ref 1.5–6.5)
RBC: 3.81 10*6/uL (ref 3.70–5.45)
RDW: 13.5 % (ref 11.2–14.5)
WBC: 7 10*3/uL (ref 3.9–10.3)
nRBC: 0 % (ref 0–0)

## 2012-08-18 LAB — COMPREHENSIVE METABOLIC PANEL (CC13)
AST: 17 U/L (ref 5–34)
Albumin: 3.7 g/dL (ref 3.5–5.0)
Alkaline Phosphatase: 56 U/L (ref 40–150)
BUN: 11 mg/dL (ref 7.0–26.0)
Potassium: 3.9 mEq/L (ref 3.5–5.1)
Sodium: 139 mEq/L (ref 136–145)
Total Bilirubin: 0.53 mg/dL (ref 0.20–1.20)

## 2012-08-18 MED ORDER — ZOLPIDEM TARTRATE 5 MG PO TABS: 5.0000 mg | ORAL_TABLET | Freq: Every evening | ORAL | Status: DC | PRN

## 2012-08-18 NOTE — Patient Instructions (Addendum)
Please contact us at [phone] if you have any questions or concerns.  Consider Vitamin D3 1000 IU daily

## 2012-08-18 NOTE — Telephone Encounter (Signed)
appts made and printed 

## 2012-08-18 NOTE — Progress Notes (Signed)
[facility]  Telephone:([phone]) [phone] [phone]  OFFICE PROGRESS NOTE   ID: Kelly Mills   DOB: November 17, 1970  MR#: [id number]  ONG#:[id number]   PCP: Ronnie Doss SU: [name], MD OTHER MD: Chipper Herb, MD   HISTORY OF PRESENT ILLNESS: From Dr. Theron Arista Rubin's new patient evaluation dated 07/17/2011: "The patient has been in good health. She has had previous mammograms. Last mammogram was in May 2012. She self detected a mass in her left breast in January of this year. She sought medical attention for this and was referred for mammogram and ultrasound. This took place on 07/03/2011. At that time a firm palpable mass was noted at 10:00 position. This measured 1.8 x 1.7 x 1.6 cm. Prominent lymph node was seen in the axilla. Biopsies of both areas were was performed. Both the breast and lymph node showed invasive mammary carcinoma he ER status was 98% PR +80%, proliferative index 84% HER-2 was negative with a ratio 0.96 MRI scan of both breasts performed 07/13/2011 showed a mass measuring 3.3 x 2.3 x 2 cm. In addition there was an enhancing nodule located 8 mm posterior inferior to the larger mass measuring 1.1 x 0.8 x 0.9 cm. In aggregate this total area measured 4.2 cm."  Her subsequent history is as detailed below.     INTERVAL HISTORY: Dr. Darnelle Catalan and I saw Kelly Mills today for follow up of invasive ductal carcinoma of the right breast.  She was last seen by Dr. Donnie Coffin on 04/10/2012.  Since her last office visit she has been doing relatively well.  She is establishing herself with Dr. Darrall Dears service today.  REVIEW OF SYSTEMS: A 10 point review of systems was completed and is negative except for "minor hot flashes" and insomnia. The patient denies any other symptomatology.   PAST MEDICAL HISTORY: Past Medical History  Diagnosis Date  . History of chemotherapy     completed 11/13/11  . Breast cancer     right  . History of radiation  therapy 01/06/12-02/25/12    right breast,5040 cgy/28 sessions, boost 1440 cGy/8 sessions  . Insomnia     PAST SURGICAL HISTORY: Past Surgical History  Procedure Laterality Date  . Nasal septum surgery    . Portacath placement  07/22/2011    Procedure: INSERTION PORT-A-CATH;  Surgeon: [name], MD;  Location: [facility];  Service: General;  Laterality: N/A;  Insertion of Port-A-Cath  . Axillary lymph node dissection  12/04/2011    Procedure: AXILLARY LYMPH NODE DISSECTION;  Surgeon: [name], MD;  Location: [facility];  Service: General;  Laterality: Right;  . Port-a-cath removal  12/04/2011    Procedure: REMOVAL PORT-A-CATH;  Surgeon: [name], MD;  Location: [facility];  Service: General;  Laterality: Left;  . Breast lumpectomy  12/04/11    right, ER/PR +, HER 2 -    FAMILY HISTORY Family History  Problem Relation Age of Onset  . Cancer Maternal Grandfather     melanoma  . Anesthesia problems Neg Hx   . Diabetes Paternal Grandfather   . Arthritis Mother   . Hypertension Father   . Psoriasis Brother     GYNECOLOGIC HISTORY: G5P2.  Hx of frequent clomid injections and other fertility drugs over 2 yrs.  The patient's last menstrual period was in 09/2011.   SOCIAL HISTORY: Kelly Mills and her husband reside in [place], [place].  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  The patient and her husband have 2 children, 2 girls,  72 years of age and 35 years of age.  ADVANCED DIRECTIVES: Not on file  HEALTH MAINTENANCE: History  Substance Use Topics  . Smoking status: Never Smoker   . Smokeless tobacco: Never Used  . Alcohol Use: Yes     Comment: socially    Colonoscopy: N/A PAP: Not on file Bone density: Not on file Lipid panel: Not on file  Allergies  Allergen Reactions  . Codeine Nausea Only    Current Outpatient Prescriptions  Medication Sig Dispense Refill  .  ibuprofen (ADVIL,MOTRIN) 200 MG tablet Take 200 mg by mouth every 6 (six) hours as needed.      . Multiple Vitamin (MULTIVITAMIN) capsule Take 1 capsule by mouth daily.      . tamoxifen (NOLVADEX) 20 MG tablet Take 1 tablet (20 mg total) by mouth daily.  30 tablet  6  . TRAZODONE HCL PO Take 100 mg by mouth at bedtime as needed.       . venlafaxine XR (EFFEXOR-XR) 37.5 MG 24 hr capsule Take 37.5 mg by mouth daily.       Marland Kitchen zolpidem (AMBIEN) 5 MG tablet Take 1 tablet (5 mg total) by mouth at bedtime as needed.  30 tablet  0   No current facility-administered medications for this visit.    OBJECTIVE: Filed Vitals:   08/18/12 0939  BP: 127/86  Pulse: 88  Temp: 98.3 F (36.8 C)  Resp: 20     Body mass index is 23.6 kg/(m^2).      ECOG FS:  Grade 1 - Symptomatic, but fully ambulatory          General appearance: Alert, cooperative, well nourished, no apparent distress Head: Normocephalic, without obvious abnormality, atraumatic Eyes: Conjunctivae/corneas clear, PERRLA, EOMI Nose: Nares, septum and mucosa are normal, no drainage or sinus tenderness Neck: No adenopathy, supple, symmetrical, trachea midline, thyroid not enlarged, no tenderness Resp: Clear to auscultation bilaterally Cardio: Regular rate and rhythm, S1, S2 normal, no murmur, click, rub or gallop Breasts: Soft bilaterally, right breast and right axillary area have well-healed surgical scars, no lymphadenopathy, no nipple inversion, bilateral firm medial mammary ridge, no axilla fullness GI: Soft, slightly distended, non-tender, hypoactive bowel sounds, no organomegaly Extremities: Extremities normal, atraumatic, no cyanosis or edema Skin: Bilateral lower extremity varicose veins RLE > LLE Lymph nodes: Cervical, supraclavicular, and axillary nodes normal Neurologic: Grossly normal   LAB RESULTS: Lab Results  Component Value Date   WBC 7.0 08/18/2012   NEUTROABS 4.8 08/18/2012   HGB 12.6 08/18/2012   HCT 37.5 08/18/2012   MCV  98.4 08/18/2012   PLT 172 08/18/2012      Chemistry      Component Value Date/Time   NA 139 08/18/2012 1059   NA 137 12/03/2011 1213   K 3.9 08/18/2012 1059   K 3.8 12/03/2011 1213   CL 101 08/18/2012 1059   CL 101 12/03/2011 1213   CO2 27 08/18/2012 1059   CO2 28 12/03/2011 1213   BUN 11.0 08/18/2012 1059   BUN 10 12/03/2011 1213   CREATININE 0.7 08/18/2012 1059   CREATININE 0.56 12/03/2011 1213      Component Value Date/Time   CALCIUM 9.4 08/18/2012 1059   CALCIUM 9.1 12/03/2011 1213   ALKPHOS 56 08/18/2012 1059   ALKPHOS 87 11/13/2011 1303   AST 17 08/18/2012 1059   AST 18 11/13/2011 1303   ALT 13 08/18/2012 1059  ALT 16 11/13/2011 1303   BILITOT 0.53 08/18/2012 1059   BILITOT 0.3 11/13/2011 1303      Lab Results  Component Value Date   LABCA2 16 04/10/2012    Urinalysis No results found for this basename: colorurine,  appearanceur,  labspec,  phurine,  glucoseu,  hgbur,  bilirubinur,  ketonesur,  proteinur,  urobilinogen,  nitrite,  leukocytesur    STUDIES: No results found.  ASSESSMENT: 42 y.[place]. [place], [place]:  1.  Status post right breast needle core biopsy at the 9 o'clock position on 07/08/2011 which showed invasive mammary carcinoma, ER 98%, PR 80%, Ki-67 84%, HER-2/neu negative.  She also had a right needle core biopsy of right axilla which was positive for metastatic mammary carcinoma.  2.  Status post bilateral breast MRI on 07/13/2011 which showed an irregular enhancing mass located laterally within the posterior third of the right breast at the 9 o'clock position with central clip artifact consistent with the diagnosed invasive mammary carcinoma. The mass measured 3.3 x 2.3 x 2.0 cm in size. There was an enhancing 1.1 x 0.8 x 0.9 cm satellite nodule located 8 mm posterior and inferior to the larger mass. In aggregate the larger mass and the satellite nodule  measure 4.2 cm in greatest dimension. There were no additional or worrisome enhancing lesions within the right breast   or within the left breast.  Two adjacent abnormal appearing level I right axillary lymph nodes worrisome for metastatic lymph nodes.  3.  Status post right needle core biopsy on 07/19/2011 at the 7 o'clock position which showed invasive mammary carcinoma ER 98%, PR 26%, Ki-67 83%, HER-2/neu negative.  4.  A genetic counseling and testing letter dated 07/26/2011 showed the patient was negative for the BRCA1 and BRCA2 gene mutations.  5.  Status post neoadjuvant chemotherapy with FEC x4 cycles with Neulasta support from 08/07/2011 through 09/18/2011.  This was followed by neoadjuvant Taxotere x4 cycles with Neulasta support from 10/03/2011 through 11/13/2011.  6.  Status post right breast lumpectomy with regional node resection of right axillary contents on 12/03/2012 for a stage IIB, ypT1c, pN1a, invasive ductal carcinoma 0.01 mm grade 1 with  DCIS grade 1, 3/8 lymph nodes positive for metastatic mammary carcinoma (2mm, 3mm, and 6mm) with extracapsular tumor extension identified, prognostic profile was not repeated.  There was insufficient invasive carcinoma present for HER-2 analysis.  7.  Status post radiation therapy from 01/06/2012 through 02/25/2012..  8.  Antiestrogen therapy with Tamoxifen started in 02/2012.  9.  Insomnia  PLAN: 1.  The patient will continue antiestrogen therapy with Tamoxifen 20 mg by mouth daily.  A bilateral digital diagnostic mammogram has been scheduled for the patient on 09/01/2012.   2. An electronic prescription for Ambien 5 mg by mouth when necessary #30 with 0 refills was sent to the patient's pharmacy for her insomnia.  The patient was cautioned against using Ambien and Trazodone concurrently for sleep.  In addition, the patient was asked to request this medication from her primary care physician in the future.  3.  We plan to see the patient again in 6 months at which time we will check a CBC, CMP, LDH, and vitamin D level.  All questions were answered.   The patient was encouraged to contact us in the interim with any problems, questions or concerns.   Larina Bras, NP-C 08/22/2012, 11:18 AM

## 2012-08-19 ENCOUNTER — Telehealth: Payer: Self-pay

## 2012-08-19 LAB — VITAMIN D 25 HYDROXY (VIT D DEFICIENCY, FRACTURES): Vit D, 25-Hydroxy: 35 ng/mL (ref 30–89)

## 2012-08-19 NOTE — Telephone Encounter (Signed)
LVMOM regarding labs for 4/8. Labs ok per [facility]. Vit D level not back at this time. Will call when results are back. Call office with any questions. TMB

## 2012-08-21 ENCOUNTER — Telehealth: Payer: Self-pay

## 2012-08-21 NOTE — Telephone Encounter (Signed)
LMOVM regarding lab results for 4/8. Per [facility], Vit D level ok. Call office with any questions. TMB

## 2012-09-01 ENCOUNTER — Ambulatory Visit
Admission: RE | Admit: 2012-09-01 | Discharge: 2012-09-01 | Disposition: A | Discharge status: Home / Self Care | Payer: BC Managed Care – PPO | Source: Ambulatory Visit | Attending: General Surgery | Admitting: General Surgery

## 2012-09-01 DIAGNOSIS — C50411 Malignant neoplasm of upper-outer quadrant of right female breast: Secondary | ICD-10-CM

## 2012-09-01 DIAGNOSIS — Z853 Personal history of malignant neoplasm of breast: Secondary | ICD-10-CM

## 2012-09-30 ENCOUNTER — Telehealth (INDEPENDENT_AMBULATORY_CARE_PROVIDER_SITE_OTHER): Payer: Self-pay

## 2012-09-30 NOTE — Telephone Encounter (Signed)
Called pt to give her the name of Maryella Shivers and phone # for her to call pt per Dr Dwain Sarna.

## 2012-11-18 ENCOUNTER — Other Ambulatory Visit: Payer: Self-pay | Admitting: Oncology

## 2012-11-18 DIAGNOSIS — C50412 Malignant neoplasm of upper-outer quadrant of left female breast: Secondary | ICD-10-CM

## 2013-01-01 IMAGING — CT NM PET TUM IMG INITIAL (PI) SKULL BASE T - THIGH
5 series · 25 of 25 positions shown · IV contrast (350 OM)
Comparison: No priors.

CLINICAL DATA: Initial treatment strategy for breast cancer.

NUCLEAR MEDICINE PET CT INITIAL (PI) SKULL BASE TO THIGH
TECHNIQUE: 16 mCi F-18 FDG was injected intravenously via the left
AC.  Full-ring PET imaging was performed from the skull base
through the mid-thighs 67  minutes after injection.  CT data was
obtained and used for attenuation correction and anatomic
localization only.  (This was not acquired as a diagnostic CT
examination.)
Fasting Blood Glucose:  87

[Series 1: pet ac · axial · 3.3mm · 4.69mm/px · z∈[-902,-32]mm · 8 of 267 slices shown]
[im 1/267]
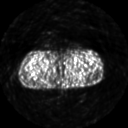
[im 39/267]
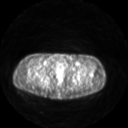
[im 77/267]
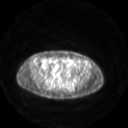
[im 115/267]
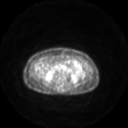
[im 153/267]
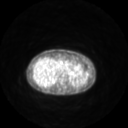
[im 191/267]
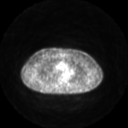
[im 229/267]
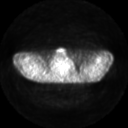
[im 267/267]
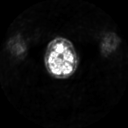

[Series 2: ct images · axial · 3.8mm · 0.98mm/px · z∈[-902,-32]mm · 7 of 267 slices shown]
[im 1/267]
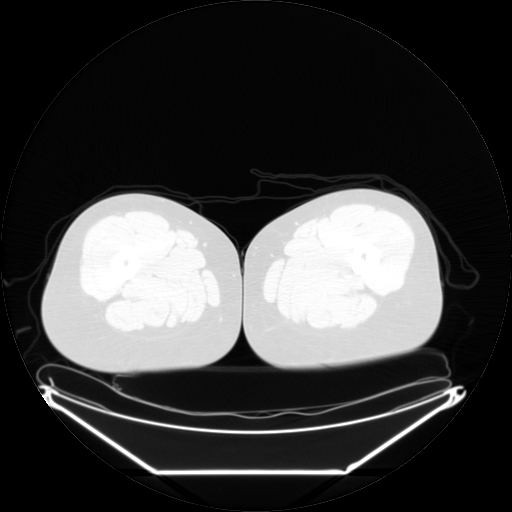
[im 45/267]
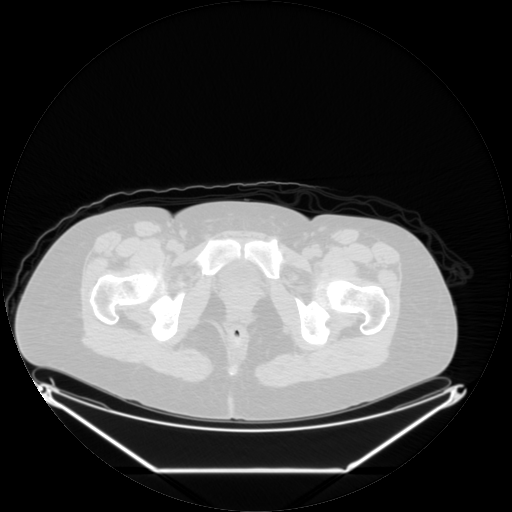
[im 89/267]
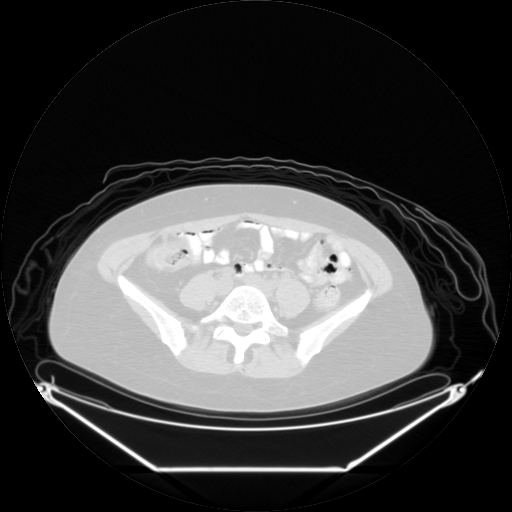
[im 134/267]
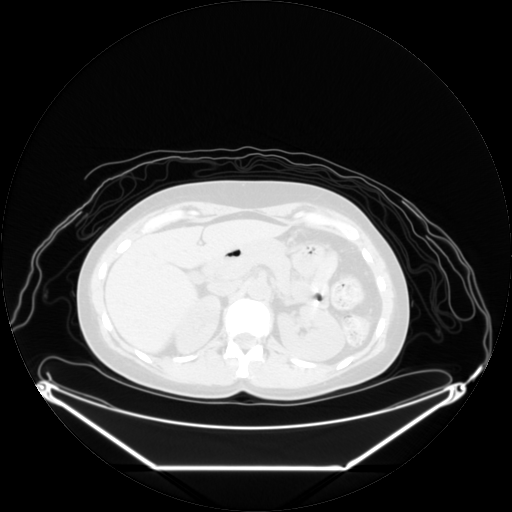
[im 178/267]
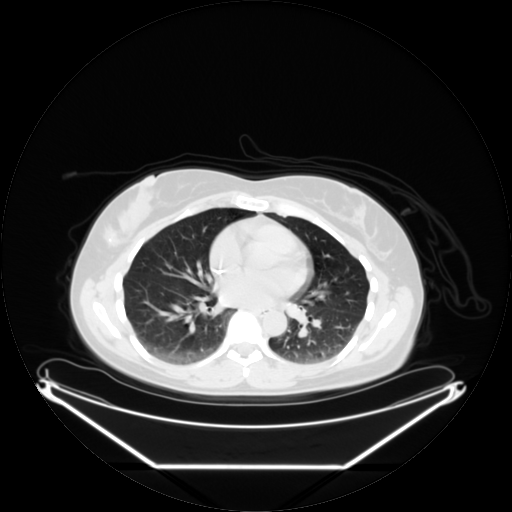
[im 222/267]
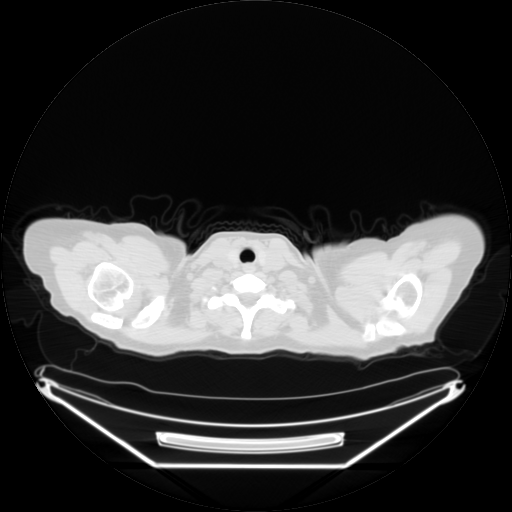
[im 267/267  brain]
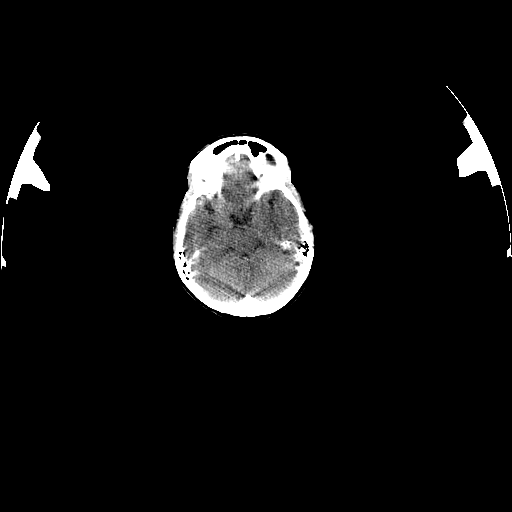

[Series 2: pet nac · axial · 3.3mm · 4.69mm/px · z∈[-902,-32]mm · 7 of 267 slices shown]
[im 1/267]
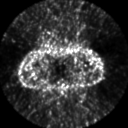
[im 45/267]
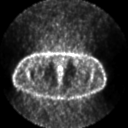
[im 89/267]
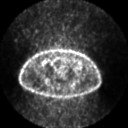
[im 134/267]
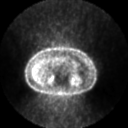
[im 178/267]
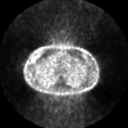
[im 222/267]
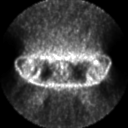
[im 267/267]
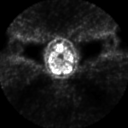

[Series 123: mip · coronal · 3.3mm · 4.69mm/px · 1 of 30 slices shown]
[im 1/30]
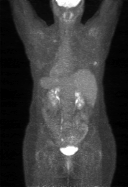

[Series 153: reformatted · coronal · 4.7mm · 6.98mm/px · 2 of 62 slices shown]
[im 1/62]
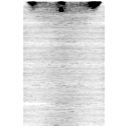
[im 62/62]
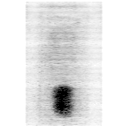

[25 of 25 positions shown; findings below may reference images not displayed]

FINDINGS: Skull Base and Neck:  No abnormal hypermetabolic activity. No
abnormal soft tissue masses or lymphadenopathy.

Thorax: There is a small focus of hypermetabolic activity (SUVmax =
4.1)at the entrance site of the left subclavian single lumen Port-A-
Cath as it traverses beneath the clavicle and superior to the left
first rib. Given the fact that the left-sided Port-A-Cath was
placed on 07/22/2011, these findings are favored to reflect some
postprocedural inflammation. In the lateral aspect of the right
breast there is a 2.3 x 1.7 cm macrolobulated lesion with slight
spiculated margins and peripheral area of calcification there is
hypermetabolic (SUVmax = 5.2), which likely represents the
patient's known primary breast cancer.  Within the right axilla
(image 77 of series 2) there is a small axillary lymph node
measuring only 7 mm in short axis that demonstrates low-level
metabolic activity (SUVmax = 1.8).No other abnormal hypermetabolic
activity. Lungs are clear. No suspicious appearing pulmonary
nodules or masses. No abnormal soft tissue masses. No mediastinal
or hilar adenopathy. Heart size is normal.

Abdomen/Pelvis:  No abnormal hypermetabolic activity. The uninfused
appearance of the liver, gallbladder, pancreas, spleen, bilateral
adrenal glands and bilateral kidneys is unremarkable. No abnormal
soft tissue masses or lymphadenopathy. No ascites or
pneumoperitoneum, and no pathologic distension of bowel. The
appearance of the uterus, bilateral ovaries and urinary bladder is
unremarkable.  Normal appendix.

Upper Thighs:  No abnormal hypermetabolic activity. No abnormal
soft tissue masses or lymphadenopathy.
IMPRESSION: 1.  2.3 x 1.7 cm hypermetabolic right lateral breast nodule
compatible with the patient's known primary breast cancer.  There
is also a nonenlarged right axillary lymph node which demonstrates
some low level metabolic activity which is suspicious for potential
nodal metastasis.  No other findings to suggest distant metastatic
disease on today's examination.
2.  Hypermetabolic activity between the left clavicle and left
first rib at the entrance site for the patient's left-sided single
lumen Port-A-Cath.  Given the fact that this Port-A-Cath was placed
on 07/22/2011, this finding is strongly favored to reflect
resolving postprocedural inflammation.

## 2013-02-12 ENCOUNTER — Telehealth: Payer: Self-pay | Admitting: Oncology

## 2013-02-12 NOTE — Telephone Encounter (Signed)
Pt called to cx 10/7 lb/GM due to upcoming travel dates. Per pt she will call back to r/s when she determines when she will be able to come in for f/u.

## 2013-02-16 ENCOUNTER — Other Ambulatory Visit: Payer: BC Managed Care – PPO | Admitting: Lab

## 2013-02-16 ENCOUNTER — Ambulatory Visit: Payer: BC Managed Care – PPO | Admitting: Oncology

## 2013-04-04 ENCOUNTER — Other Ambulatory Visit: Payer: Self-pay | Admitting: Oncology

## 2013-04-05 ENCOUNTER — Telehealth: Payer: Self-pay | Admitting: *Deleted

## 2013-04-05 NOTE — Telephone Encounter (Signed)
Refill request received.  No future appointments.  Patient cancelled October 7th f/u and was to call to reschedule due to travel plans.  Message left requesting a call to office and to request scheduling to reschedule October's f/u.  One thirty day refill authorized.  Awaiting return call from patient.

## 2013-05-28 ENCOUNTER — Other Ambulatory Visit: Payer: Self-pay | Admitting: Oncology

## 2013-05-28 NOTE — Telephone Encounter (Signed)
This RN called pt per [facility] and need for tamoxifen refill.  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  Refill x 1 given.

## 2013-06-14 ENCOUNTER — Ambulatory Visit (HOSPITAL_BASED_OUTPATIENT_CLINIC_OR_DEPARTMENT_OTHER): Payer: BC Managed Care – PPO | Admitting: Oncology

## 2013-06-14 ENCOUNTER — Encounter (INDEPENDENT_AMBULATORY_CARE_PROVIDER_SITE_OTHER): Payer: Self-pay

## 2013-06-14 ENCOUNTER — Telehealth: Payer: Self-pay | Admitting: Oncology

## 2013-06-14 VITALS — BP 138/89 | HR 86 | Temp 99.1°F | Resp 18 | Ht 67.0 in | Wt 161.6 lb

## 2013-06-14 DIAGNOSIS — N959 Unspecified menopausal and perimenopausal disorder: Secondary | ICD-10-CM

## 2013-06-14 DIAGNOSIS — Z171 Estrogen receptor negative status [ER-]: Secondary | ICD-10-CM

## 2013-06-14 DIAGNOSIS — C50419 Malignant neoplasm of upper-outer quadrant of unspecified female breast: Secondary | ICD-10-CM

## 2013-06-14 DIAGNOSIS — C50411 Malignant neoplasm of upper-outer quadrant of right female breast: Secondary | ICD-10-CM

## 2013-06-14 DIAGNOSIS — C773 Secondary and unspecified malignant neoplasm of axilla and upper limb lymph nodes: Secondary | ICD-10-CM

## 2013-06-14 DIAGNOSIS — C50919 Malignant neoplasm of unspecified site of unspecified female breast: Secondary | ICD-10-CM

## 2013-06-14 DIAGNOSIS — Z170 Estrogen receptor positive status [ER+]: Secondary | ICD-10-CM | POA: Insufficient documentation

## 2013-06-14 MED ORDER — VENLAFAXINE HCL 37.5 MG PO TABS: 37.5000 mg | ORAL_TABLET | Freq: Two times a day (BID) | ORAL | Status: DC

## 2013-06-14 MED ORDER — TRAZODONE HCL 100 MG PO TABS: 100.0000 mg | ORAL_TABLET | Freq: Every evening | ORAL | Status: DC | PRN

## 2013-06-14 NOTE — Telephone Encounter (Signed)
, °

## 2013-06-14 NOTE — Progress Notes (Signed)
[place]  Telephone:(336) [phone] Fax:(336) [phone]  OFFICE PROGRESS NOTE   ID: [name]   DOB: July 06, 1970  MR#: [id number]  EUM#:[id number]   PCP: Wynelle Fanny SU: Rolm Bookbinder, MD OTHER MD: Arloa Koh, MD   HISTORY OF PRESENT ILLNESS: From Dr. Collier Salina Rubin's new patient evaluation dated 07/17/2011:  "The patient has been in good health. She has had previous mammograms. Last mammogram was in May 2012. She self detected a mass in her left breast in January of this year. She sought medical attention for this and was referred for mammogram and ultrasound. This took place on 07/03/2011. At that time a firm palpable mass was noted at 10:00 position. This measured 1.8 x 1.7 x 1.6 cm. Prominent lymph node was seen in the axilla. Biopsies of both areas were was performed. Both the breast and lymph node showed invasive mammary carcinoma he ER status was 98% PR +80%, proliferative index 84% HER-2 was negative with a ratio 0.96 MRI scan of both breasts performed 07/13/2011 showed a mass measuring 3.3 x 2.3 x 2 cm. In addition there was an enhancing nodule located 8 mm posterior inferior to the larger mass measuring 1.1 x 0.8 x 0.9 cm. In aggregate this total area measured 4.2 cm."  Her subsequent history is as detailed below.     INTERVAL HISTORY: Kelly Mills returns today for followup of her breast cancer. The interval history is generally unremarkable and she is doing "well". 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 She exercises by doing your out twice a week and walking a mile every day. There is some family stress related to finances. Her children are now 31 and 9.  REVIEW OF SYSTEMS: She noticed an increase in hot flashes for the past 2 months. She did not remember that venlafaxine was prescribed for hot flashes and stopped that medication. She has mild insomnia. She is getting some return of sensation in the medial aspect of the right upper extremity.  Aside from that a detailed review of systems today was entirely negative   PAST MEDICAL HISTORY: Past Medical History  Diagnosis Date  . History of chemotherapy     completed 11/13/11  . Breast cancer     right  . History of radiation therapy 01/06/12-02/25/12    right breast,5040 cgy/28 sessions, boost 1440 cGy/8 sessions  . Insomnia     PAST SURGICAL HISTORY: Past Surgical History  Procedure Laterality Date  . Nasal septum surgery    . Portacath placement  07/22/2011    Procedure: INSERTION PORT-A-CATH;  Surgeon: Rolm Bookbinder, MD;  Location: [facility];  Service: General;  Laterality: N/A;  Insertion of Port-A-Cath  . Axillary lymph node dissection  12/04/2011    Procedure: AXILLARY LYMPH NODE DISSECTION;  Surgeon: Rolm Bookbinder, MD;  Location: [facility];  Service: General;  Laterality: Right;  . Port-a-cath removal  12/04/2011    Procedure: REMOVAL PORT-A-CATH;  Surgeon: Rolm Bookbinder, MD;  Location: [facility];  Service: General;  Laterality: Left;  . Breast lumpectomy  12/04/11    right, ER/PR +, HER 2 -    FAMILY HISTORY Family History  Problem Relation Age of Onset  . Cancer Maternal Grandfather     melanoma  . Anesthesia problems Neg Hx   . Diabetes Paternal Grandfather   . Arthritis Mother   . Hypertension Father   . Psoriasis Brother     GYNECOLOGIC HISTORY: G5P2.  Hx of frequent clomid injections  and other fertility drugs over 2 yrs.  The patient's last menstrual period was in 09/2011.   SOCIAL HISTORY: [name] and her husband reside in [place], [place].  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  The patient and her husband have 2 children, 2 girls,  60 years of age and 74 years of age.  ADVANCED DIRECTIVES: Not on file  HEALTH MAINTENANCE: History  Substance Use Topics  . Smoking status: Never Smoker   . Smokeless tobacco: Never Used  . Alcohol Use: Yes      Comment: socially    Colonoscopy: N/A PAP: Not on file Bone density: Not on file Lipid panel: Not on file  Allergies  Allergen Reactions  . Codeine Nausea Only    Current Outpatient Prescriptions  Medication Sig Dispense Refill  . ibuprofen (ADVIL,MOTRIN) 200 MG tablet Take 200 mg by mouth every 6 (six) hours as needed.      . Multiple Vitamin (MULTIVITAMIN) capsule Take 1 capsule by mouth daily.      . tamoxifen (NOLVADEX) 20 MG tablet TAKE 1 TABLET BY MOUTH EVERY DAY  30 tablet  0  . TRAZODONE HCL PO Take 100 mg by mouth at bedtime as needed.       . venlafaxine XR (EFFEXOR-XR) 37.5 MG 24 hr capsule Take 37.5 mg by mouth daily.       Marland Kitchen zolpidem (AMBIEN) 5 MG tablet Take 1 tablet (5 mg total) by mouth at bedtime as needed.  30 tablet  0   No current facility-administered medications for this visit.    OBJECTIVE: Middle-aged white woman in no acute distress Filed Vitals:   06/14/13 0913  BP: 138/89  Pulse: 86  Temp: 99.1 F (37.3 C)  Resp: 18     Body mass index is 25.3 kg/(m^2).      ECOG FS:  Grade 0       Sclerae unicteric, pupils equal and reactive Oropharynx clear and moist No cervical or supraclavicular adenopathy Lungs no rales or rhonchi Heart regular rate and rhythm Abd soft, nontender, positive bowel sounds MSK no focal spinal tenderness, no upper extremity lymphedema Neuro: nonfocal, well oriented, appropriate affect Breasts: The right breast is status post lumpectomy and radiation. There is no evidence of local recurrence. The right axilla is benign. The left breast is unremarkable   LAB RESULTS: Lab Results  Component Value Date   WBC 7.0 08/18/2012   NEUTROABS 4.8 08/18/2012   HGB 12.6 08/18/2012   HCT 37.5 08/18/2012   MCV 98.4 08/18/2012   PLT 172 08/18/2012      Chemistry      Component Value Date/Time   NA 139 08/18/2012 1059   NA 137 12/03/2011 1213   K 3.9 08/18/2012 1059   K 3.8 12/03/2011 1213   CL 101 08/18/2012 1059   CL 101 12/03/2011 1213   CO2  27 08/18/2012 1059   CO2 28 12/03/2011 1213   BUN 11.0 08/18/2012 1059   BUN 10 12/03/2011 1213   CREATININE 0.7 08/18/2012 1059   CREATININE 0.56 12/03/2011 1213      Component Value Date/Time   CALCIUM 9.4 08/18/2012 1059   CALCIUM 9.1 12/03/2011 1213   ALKPHOS 56 08/18/2012 1059   ALKPHOS 87 11/13/2011 1303   AST 17 08/18/2012 1059   AST 18 11/13/2011 1303   ALT 13 08/18/2012 1059   ALT 16 11/13/2011 1303   BILITOT 0.53 08/18/2012 1059   BILITOT  0.3 11/13/2011 1303      Lab Results  Component Value Date   LABCA2 16 04/10/2012    Urinalysis No results found for this basename: colorurine,  appearanceur,  labspec,  phurine,  glucoseu,  hgbur,  bilirubinur,  ketonesur,  proteinur,  urobilinogen,  nitrite,  leukocytesur    STUDIES: Next mammogram is due April of 2015  ASSESSMENT: 43 y.o. [place], [place] woman:  1.  Status post right breast needle core biopsy at the 9 o'clock position on 07/08/2011 which showed invasive mammary carcinoma, ER 98%, PR 80%, Ki-67 84%, HER-2/neu negative.  She also had a right needle core biopsy of right axilla which was positive for metastatic mammary carcinoma.  2.  Status post bilateral breast MRI on 07/13/2011 which showed an irregular enhancing mass located laterally within the posterior third of the right breast at the 9 o'clock position with central clip artifact consistent with the diagnosed invasive mammary carcinoma. The mass measured 3.3 x 2.3 x 2.0 cm in size. There was an enhancing 1.1 x 0.8 x 0.9 cm satellite nodule located 8 mm posterior and inferior to the larger mass. In aggregate the larger mass and the satellite nodule  measure 4.2 cm in greatest dimension. There were no additional or worrisome enhancing lesions within the right breast  or within the left breast.  Two adjacent abnormal appearing level I right axillary lymph nodes worrisome for metastatic lymph nodes.  3.  Status post right needle core biopsy on 07/19/2011 at the 7 o'clock position  which showed invasive mammary carcinoma ER 98%, PR 26%, Ki-67 83%, HER-2/neu negative.  4.  A genetic counseling and testing letter dated 07/26/2011 showed the patient was negative for the BRCA1 and BRCA2 gene mutations.  5.  Status post neoadjuvant chemotherapy with FEC x4 cycles with Neulasta support from 08/07/2011 through 09/18/2011.  This was followed by neoadjuvant Taxotere x4 cycles with Neulasta support from 10/03/2011 through 11/13/2011.  6.  Status post right breast lumpectomy with regional node resection of right axillary contents on 12/03/2012 for a stage IIB, ypT1c, pN1a, invasive ductal carcinoma, grade 1, 3/8 lymph nodes positive for metastatic mammary carcinoma (24m, 391m and 75m58mwith extracapsular tumor extension identified, prognostic profile was not repeated.  There was insufficient invasive carcinoma present for HER-2 analysis.  7.  Status post radiation therapy from 01/06/2012 through 02/25/2012..  8.  Tamoxifen started in 02/2012.    PLAN: Kelly Mills is tolerating the tamoxifen well, with hot flashes as the principal symptom. Likely the hot flashes are more noticeable because she stopped the venlafaxine a few months ago. Today we went over the optimal use of that medication and I wrote her prescription so she can obtain the generic form. She can take 37.5 mg twice daily which is equivalent to what she was doing before, or, given that current family stresses, perhaps she might benefit from taking 75 mg twice daily, which would be double the prior dose. She is going to "experimental" and let me know if we need to refill her medications any differently.  We did not obtain lab work today because she has a very high co-pay and she is a ready scheduled for lab work through her primary care physician. She will make sure we get a copy of that.  I have advised her to undergo tomography as well as routine mammography in April, since she has dense breasts (category C.) and tomography  increases mammographic sensitivity.  We're going to continue tamoxifen at least until October  of this year, but possibly for as long as 10 years. We will make the decision of the next visit. Minna Merritts has a good understanding of the overall plan, and agrees with it. She knows in her case the intent to treat is cure     Chauncey Cruel, MD  06/14/2013, 9:25 AM

## 2013-07-04 ENCOUNTER — Other Ambulatory Visit: Payer: Self-pay | Admitting: Oncology

## 2013-07-04 DIAGNOSIS — C50419 Malignant neoplasm of upper-outer quadrant of unspecified female breast: Secondary | ICD-10-CM

## 2013-07-29 ENCOUNTER — Other Ambulatory Visit: Payer: Self-pay | Admitting: *Deleted

## 2013-09-13 ENCOUNTER — Other Ambulatory Visit: Payer: Self-pay | Admitting: Oncology

## 2013-09-13 DIAGNOSIS — Z853 Personal history of malignant neoplasm of breast: Secondary | ICD-10-CM

## 2013-09-14 ENCOUNTER — Other Ambulatory Visit: Payer: Self-pay

## 2013-09-14 ENCOUNTER — Other Ambulatory Visit: Payer: Self-pay | Admitting: Oncology

## 2013-09-14 DIAGNOSIS — Z853 Personal history of malignant neoplasm of breast: Secondary | ICD-10-CM

## 2013-11-02 ENCOUNTER — Ambulatory Visit
Admission: RE | Admit: 2013-11-02 | Discharge: 2013-11-02 | Disposition: A | Discharge status: Home / Self Care | Payer: BC Managed Care – PPO | Source: Ambulatory Visit | Attending: Oncology | Admitting: Oncology

## 2013-11-02 DIAGNOSIS — Z853 Personal history of malignant neoplasm of breast: Secondary | ICD-10-CM

## 2013-12-14 ENCOUNTER — Other Ambulatory Visit: Payer: BC Managed Care – PPO

## 2013-12-14 ENCOUNTER — Telehealth: Payer: Self-pay | Admitting: Nurse Practitioner

## 2013-12-14 NOTE — Telephone Encounter (Signed)
pt cld & wanted to chge appt to later in September-stated got stung by a bee and was unable to make appt this morning

## 2013-12-21 ENCOUNTER — Ambulatory Visit: Payer: BC Managed Care – PPO | Admitting: Nurse Practitioner

## 2014-01-25 ENCOUNTER — Telehealth: Payer: Self-pay | Admitting: Nurse Practitioner

## 2014-01-25 ENCOUNTER — Ambulatory Visit (HOSPITAL_BASED_OUTPATIENT_CLINIC_OR_DEPARTMENT_OTHER): Payer: 59 | Admitting: Nurse Practitioner

## 2014-01-25 ENCOUNTER — Other Ambulatory Visit (HOSPITAL_BASED_OUTPATIENT_CLINIC_OR_DEPARTMENT_OTHER): Payer: 59

## 2014-01-25 ENCOUNTER — Encounter: Payer: Self-pay | Admitting: Nurse Practitioner

## 2014-01-25 VITALS — BP 146/108 | HR 105 | Temp 98.3°F | Resp 18 | Ht 67.0 in | Wt 170.3 lb

## 2014-01-25 DIAGNOSIS — R030 Elevated blood-pressure reading, without diagnosis of hypertension: Secondary | ICD-10-CM

## 2014-01-25 DIAGNOSIS — R232 Flushing: Secondary | ICD-10-CM

## 2014-01-25 DIAGNOSIS — R61 Generalized hyperhidrosis: Secondary | ICD-10-CM

## 2014-01-25 DIAGNOSIS — R7402 Elevation of levels of lactic acid dehydrogenase (LDH): Secondary | ICD-10-CM

## 2014-01-25 DIAGNOSIS — R7401 Elevation of levels of liver transaminase levels: Secondary | ICD-10-CM

## 2014-01-25 DIAGNOSIS — Z170 Estrogen receptor positive status [ER+]: Secondary | ICD-10-CM

## 2014-01-25 DIAGNOSIS — C50919 Malignant neoplasm of unspecified site of unspecified female breast: Secondary | ICD-10-CM

## 2014-01-25 DIAGNOSIS — T451X5A Adverse effect of antineoplastic and immunosuppressive drugs, initial encounter: Secondary | ICD-10-CM

## 2014-01-25 DIAGNOSIS — M8580 Other specified disorders of bone density and structure, unspecified site: Secondary | ICD-10-CM

## 2014-01-25 DIAGNOSIS — C773 Secondary and unspecified malignant neoplasm of axilla and upper limb lymph nodes: Secondary | ICD-10-CM

## 2014-01-25 DIAGNOSIS — F32A Depression, unspecified: Secondary | ICD-10-CM

## 2014-01-25 DIAGNOSIS — C50519 Malignant neoplasm of lower-outer quadrant of unspecified female breast: Secondary | ICD-10-CM

## 2014-01-25 DIAGNOSIS — F341 Dysthymic disorder: Secondary | ICD-10-CM

## 2014-01-25 DIAGNOSIS — F329 Major depressive disorder, single episode, unspecified: Secondary | ICD-10-CM | POA: Insufficient documentation

## 2014-01-25 DIAGNOSIS — R740 Nonspecific elevation of levels of transaminase and lactic acid dehydrogenase [LDH]: Secondary | ICD-10-CM

## 2014-01-25 DIAGNOSIS — C50411 Malignant neoplasm of upper-outer quadrant of right female breast: Secondary | ICD-10-CM

## 2014-01-25 LAB — CBC WITH DIFFERENTIAL/PLATELET
BASO%: 0.6 % (ref 0.0–2.0)
Basophils Absolute: 0 10*3/uL (ref 0.0–0.1)
EOS%: 0.7 % (ref 0.0–7.0)
Eosinophils Absolute: 0 10*3/uL (ref 0.0–0.5)
HEMATOCRIT: 37.7 % (ref 34.8–46.6)
HGB: 12.5 g/dL (ref 11.6–15.9)
LYMPH#: 1.4 10*3/uL (ref 0.9–3.3)
LYMPH%: 22.5 % (ref 14.0–49.7)
MCH: 33.8 pg (ref 25.1–34.0)
MCHC: 33.1 g/dL (ref 31.5–36.0)
MCV: 102.2 fL — ABNORMAL HIGH (ref 79.5–101.0)
MONO#: 0.5 10*3/uL (ref 0.1–0.9)
MONO%: 7.6 % (ref 0.0–14.0)
NEUT%: 68.6 % (ref 38.4–76.8)
NEUTROS ABS: 4.3 10*3/uL (ref 1.5–6.5)
Platelets: 179 10*3/uL (ref 145–400)
RBC: 3.69 10*6/uL — AB (ref 3.70–5.45)
RDW: 13 % (ref 11.2–14.5)
WBC: 6.2 10*3/uL (ref 3.9–10.3)

## 2014-01-25 LAB — COMPREHENSIVE METABOLIC PANEL (CC13)
ALBUMIN: 3.5 g/dL (ref 3.5–5.0)
ALT: 44 U/L (ref 0–55)
ANION GAP: 11 meq/L (ref 3–11)
AST: 67 U/L — ABNORMAL HIGH (ref 5–34)
Alkaline Phosphatase: 65 U/L (ref 40–150)
BUN: 16.3 mg/dL (ref 7.0–26.0)
CALCIUM: 8.6 mg/dL (ref 8.4–10.4)
CHLORIDE: 107 meq/L (ref 98–109)
CO2: 22 meq/L (ref 22–29)
Creatinine: 0.7 mg/dL (ref 0.6–1.1)
Glucose: 101 mg/dl (ref 70–140)
POTASSIUM: 3.7 meq/L (ref 3.5–5.1)
SODIUM: 140 meq/L (ref 136–145)
TOTAL PROTEIN: 6.7 g/dL (ref 6.4–8.3)
Total Bilirubin: 0.29 mg/dL (ref 0.20–1.20)

## 2014-01-25 NOTE — Telephone Encounter (Signed)
, °

## 2014-01-25 NOTE — Progress Notes (Addendum)
[place]  Telephone:(336) [phone] Fax:(336) [phone]  OFFICE PROGRESS NOTE   ID: Kelly Mills   DOB: 1970-12-21  MR#: [id number]  JQZ#:[id number]   PCP: Wynelle Fanny SU: Rolm Bookbinder, MD OTHER MD: Arloa Koh, MD   HISTORY OF PRESENT ILLNESS: From Dr. Collier Salina Rubin's new patient evaluation dated 07/17/2011:  "The patient has been in good health. She has had previous mammograms. Last mammogram was in May 2012. She self detected a mass in her left breast in January of this year. She sought medical attention for this and was referred for mammogram and ultrasound. This took place on 07/03/2011. At that time a firm palpable mass was noted at 10:00 position. This measured 1.8 x 1.7 x 1.6 cm. Prominent lymph node was seen in the axilla. Biopsies of both areas were was performed. Both the breast and lymph node showed invasive mammary carcinoma he ER status was 98% PR +80%, proliferative index 84% HER-2 was negative with a ratio 0.96 MRI scan of both breasts performed 07/13/2011 showed a mass measuring 3.3 x 2.3 x 2 cm. In addition there was an enhancing nodule located 8 mm posterior inferior to the larger mass measuring 1.1 x 0.8 x 0.9 cm. In aggregate this total area measured 4.2 cm."  Her subsequent history is as detailed below.     INTERVAL HISTORY: Kelly Mills returns today for follow up of her breast cancer. She has been on tamoxifen since October 2013. She is tolerating it well, with hot flashes as her main complaint, with venlafaxine ordered for this symptom. This interval history has been extremely stressful for her. She is finalizing a divorce after 17 years of marriage. Her PCP went ahead and increased her venlafaxine to 131m ER daily, but stress and anxiety continue to be an issue for her. Her blood pressure in office today was higher than it has been in awhile, partially due to stress. She is able to walk at least a mile daily with her dog. However, she has  noticed an increase in her stress eating, and thus weight gain.  REVIEW OF SYSTEMS: Kelly Mills deny fevers, chills, nausea, vomiting, or changes in bowel or bladder habits. She is unsure if she has completely stopped her period yet, but thinks she has seen some irregular spotting in the last 6 months. Her right foot is under the care an orthopedic specialist due to unexplained swelling which causes her gait to be altered and put more undue stress on her right hip. She denies shortness of breath, chest pain, or palpitations. A detailed review of systems was otherwise negative.   PAST MEDICAL HISTORY: Past Medical History  Diagnosis Date  . History of chemotherapy     completed 11/13/11  . Breast cancer     right  . History of radiation therapy 01/06/12-02/25/12    right breast,5040 cgy/28 sessions, boost 1440 cGy/8 sessions  . Insomnia     PAST SURGICAL HISTORY: Past Surgical History  Procedure Laterality Date  . Nasal septum surgery    . Portacath placement  07/22/2011    Procedure: INSERTION PORT-A-CATH;  Surgeon: MRolm Bookbinder MD;  Location: [facility]  Service: General;  Laterality: N/A;  Insertion of Port-A-Cath  . Axillary lymph node dissection  12/04/2011    Procedure: AXILLARY LYMPH NODE DISSECTION;  Surgeon: MRolm Bookbinder MD;  [facility]  Service: General;  Laterality: Right;  . Port-a-cath removal  12/04/2011    Procedure: REMOVAL PORT-A-CATH;  Surgeon: MRolm Bookbinder MD;  Location:  [facility];  Service: General;  Laterality: Left;  . Breast lumpectomy  12/04/11    right, ER/PR +, HER 2 -    FAMILY HISTORY Family History  Problem Relation Age of Onset  . Cancer Maternal Grandfather     melanoma  . Anesthesia problems Neg Hx   . Diabetes Paternal Grandfather   . Arthritis Mother   . Hypertension Father   . Psoriasis Brother     GYNECOLOGIC HISTORY: G5P2.  Hx of frequent clomid injections and other fertility drugs over 2  yrs.  The patient's last menstrual period was in 09/2011.   SOCIAL HISTORY: Mrs. Kelly Mills and her husband reside in [place], [place].  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  The patient and her husband have 2 children, 2 girls,  65 years of age and 61 years of age.  ADVANCED DIRECTIVES: Not on file  HEALTH MAINTENANCE: History  Substance Use Topics  . Smoking status: Never Smoker   . Smokeless tobacco: Never Used  . Alcohol Use: Yes     Comment: socially    Colonoscopy: N/A PAP: Not on file Bone density: Not on file Lipid panel: Not on file  Allergies  Allergen Reactions  . Codeine Nausea Only    Current Outpatient Prescriptions  Medication Sig Dispense Refill  . Multiple Vitamin (MULTIVITAMIN) capsule Take 1 capsule by mouth daily.      . tamoxifen (NOLVADEX) 20 MG tablet TAKE 1 TABLET BY MOUTH EVERY DAY  30 tablet  5  . traZODone (DESYREL) 100 MG tablet Take 1 tablet (100 mg total) by mouth at bedtime as needed.  30 tablet  12  . venlafaxine XR (EFFEXOR-XR) 150 MG 24 hr capsule Take 150 mg by mouth daily with breakfast.      . zolpidem (AMBIEN) 5 MG tablet Take 5 mg by mouth at bedtime as needed for sleep.      Marland Kitchen ibuprofen (ADVIL,MOTRIN) 200 MG tablet Take 200 mg by mouth every 6 (six) hours as needed.      . zolpidem (AMBIEN) 5 MG tablet Take 1 tablet (5 mg total) by mouth at bedtime as needed.  30 tablet  0   No current facility-administered medications for this visit.    OBJECTIVE: Middle-aged white woman in no acute distress Filed Vitals:   01/25/14 1330  BP: 146/108  Pulse:   Temp:   Resp:      Body mass index is 26.67 kg/(m^2).      ECOG FS:  Grade 1  Skin: warm, dry  HEENT: sclerae anicteric, conjunctivae pink, oropharynx clear. No thrush or mucositis.  Lymph Nodes: No cervical or supraclavicular lymphadenopathy  Lungs: clear to auscultation bilaterally, no rales, wheezes, or rhonci  Heart: regular  rate and rhythm  Abdomen: round, soft, non tender, positive bowel sounds  Musculoskeletal: No focal spinal tenderness, right foot with +1 nonpitting edema Neuro: non focal, well oriented, positive affect  Breasts: right breast status post lumpectomy and radiation. No evidence of recurrent disease, right axilla benign. Left breast unremarkable.  LAB RESULTS: Lab Results  Component Value Date   WBC 6.2 01/25/2014   NEUTROABS 4.3 01/25/2014   HGB 12.5 01/25/2014   HCT 37.7 01/25/2014   MCV 102.2* 01/25/2014   PLT 179 01/25/2014      Chemistry      Component Value Date/Time   NA 140 01/25/2014 1305   NA 137 12/03/2011 1213   K  3.7 01/25/2014 1305   K 3.8 12/03/2011 1213   CL 101 08/18/2012 1059   CL 101 12/03/2011 1213   CO2 22 01/25/2014 1305   CO2 28 12/03/2011 1213   BUN 16.3 01/25/2014 1305   BUN 10 12/03/2011 1213   CREATININE 0.7 01/25/2014 1305   CREATININE 0.56 12/03/2011 1213      Component Value Date/Time   CALCIUM 8.6 01/25/2014 1305   CALCIUM 9.1 12/03/2011 1213   ALKPHOS 65 01/25/2014 1305   ALKPHOS 87 11/13/2011 1303   AST 67* 01/25/2014 1305   AST 18 11/13/2011 1303   ALT 44 01/25/2014 1305   ALT 16 11/13/2011 1303   BILITOT 0.29 01/25/2014 1305   BILITOT 0.3 11/13/2011 1303      Lab Results  Component Value Date   LABCA2 16 04/10/2012    Urinalysis No results found for this basename: colorurine,  appearanceur,  labspec,  phurine,  glucoseu,  hgbur,  bilirubinur,  ketonesur,  proteinur,  urobilinogen,  nitrite,  leukocytesur    STUDIES: Most recent mammogram performed on 11/02/13 was unremarkable  ASSESSMENT: 43 y.o. [place], [place] woman:  1.  Status post right breast needle core biopsy at the 9 o'clock position on 07/08/2011 which showed invasive mammary carcinoma, ER 98%, PR 80%, Ki-67 84%, HER-2/neu negative.  She also had a right needle core biopsy of right axilla which was positive for metastatic mammary carcinoma.  2.  Status post bilateral breast MRI on  07/13/2011 which showed an irregular enhancing mass located laterally within the posterior third of the right breast at the 9 o'clock position with central clip artifact consistent with the diagnosed invasive mammary carcinoma. The mass measured 3.3 x 2.3 x 2.0 cm in size. There was an enhancing 1.1 x 0.8 x 0.9 cm satellite nodule located 8 mm posterior and inferior to the larger mass. In aggregate the larger mass and the satellite nodule  measure 4.2 cm in greatest dimension. There were no additional or worrisome enhancing lesions within the right breast  or within the left breast.  Two adjacent abnormal appearing level I right axillary lymph nodes worrisome for metastatic lymph nodes.  3.  Status post right needle core biopsy on 07/19/2011 at the 7 o'clock position which showed invasive mammary carcinoma ER 98%, PR 26%, Ki-67 83%, HER-2/neu negative.  4.  A genetic counseling and testing letter dated 07/26/2011 showed the patient was negative for the BRCA1 and BRCA2 gene mutations.  5.  Status post neoadjuvant chemotherapy with FEC x4 cycles with Neulasta support from 08/07/2011 through 09/18/2011.  This was followed by neoadjuvant Taxotere x4 cycles with Neulasta support from 10/03/2011 through 11/13/2011.  6.  Status post right breast lumpectomy with regional node resection of right axillary contents on 12/03/2012 for a stage IIB, ypT1c, pN1a, invasive ductal carcinoma, grade 1, 3/8 lymph nodes positive for metastatic mammary carcinoma (71m, 317m and 34m1mwith extracapsular tumor extension identified, prognostic profile was not repeated.  There was insufficient invasive carcinoma present for HER-2 analysis.  7.  Status post radiation therapy from 01/06/2012 through 02/25/2012..  8.  Tamoxifen started in 02/2012.  PLAN: Kelly Mills continues to tolerate the tamoxifen well. The venlafaxine works well for her hot flashes. However, the increase dose is not doing as much as she'd like for her depression  and anxiety. While venlafaxine itself is a drug that can cause small increases in SBP, she believes the rise in her blood pressure has a lot to do with her daily stress. I  advised her to make a follow up appointment with her PCP soon to discuss alternative therapy and to start checking her blood pressure on her own at home. She may need to start on a low dose antihypertensive agent for a short time.   At this time her approaching menopause is still in the air. We will continue the tamoxifen for the next 6 months and she will discuss with Dr. Jana Hakim whether or not then would be a good time to switch to an aromatase inhibitor or if she will continue the tamoxifen for up to 5 or 10 years. She will have a DEXA scan before her next visit here to assess her bone density. Her mother is battling osteoporosis at this time.  The labs were reviewed in detail with her and a sharp increase in her AST and ALT were noted. She denies regular use of tylenol, and says she only has 1 glass of wine with dinner on occasion. She states she has no history of fatty liver or ever having a high cholesterol requiring medication. I advised her to avoid these agents and place orders for a liver ultrasound to be performed in the upcoming week.   If everything returns benign, Kelly Mills will return in 6 months for labs and an office visit with Dr. Jana Hakim to determine her course of anti estrogen therapy. Kelly Mills understands and agrees with the plan. She knows a goal of treatment in her case is cure. She has been encouraged to call with any issues that might arise before her next visit here.    Kelly Duster, NP  01/25/2014, 5:05 PM

## 2014-02-13 ENCOUNTER — Other Ambulatory Visit: Payer: Self-pay | Admitting: Oncology

## 2014-02-13 DIAGNOSIS — C50411 Malignant neoplasm of upper-outer quadrant of right female breast: Secondary | ICD-10-CM

## 2014-02-16 ENCOUNTER — Other Ambulatory Visit: Payer: 59

## 2014-02-17 ENCOUNTER — Ambulatory Visit (HOSPITAL_COMMUNITY)
Admission: RE | Admit: 2014-02-17 | Discharge: 2014-02-17 | Disposition: A | Discharge status: Home / Self Care | Payer: 59 | Source: Ambulatory Visit | Attending: Nurse Practitioner | Admitting: Nurse Practitioner

## 2014-02-17 DIAGNOSIS — Z853 Personal history of malignant neoplasm of breast: Secondary | ICD-10-CM | POA: Insufficient documentation

## 2014-02-17 DIAGNOSIS — N2889 Other specified disorders of kidney and ureter: Secondary | ICD-10-CM | POA: Insufficient documentation

## 2014-02-17 DIAGNOSIS — R7989 Other specified abnormal findings of blood chemistry: Secondary | ICD-10-CM | POA: Insufficient documentation

## 2014-02-17 DIAGNOSIS — R740 Nonspecific elevation of levels of transaminase and lactic acid dehydrogenase [LDH]: Secondary | ICD-10-CM

## 2014-02-17 DIAGNOSIS — R7401 Elevation of levels of liver transaminase levels: Secondary | ICD-10-CM

## 2014-02-18 ENCOUNTER — Telehealth: Payer: Self-pay | Admitting: *Deleted

## 2014-02-18 NOTE — Telephone Encounter (Signed)
Called pt to inform her of results concerning ultrasound of liver. No answer. I left a message for pt to call this nurse on Monday @ [phone] and hopefully can talk to her on Monday about ways to treat what looks like a probable fatty liver. Message to be forwarded to [facility].

## 2014-02-22 ENCOUNTER — Ambulatory Visit
Admission: RE | Admit: 2014-02-22 | Discharge: 2014-02-22 | Disposition: A | Discharge status: Home / Self Care | Payer: 59 | Source: Ambulatory Visit | Attending: Nurse Practitioner | Admitting: Nurse Practitioner

## 2014-02-22 DIAGNOSIS — M8580 Other specified disorders of bone density and structure, unspecified site: Secondary | ICD-10-CM

## 2014-03-14 ENCOUNTER — Encounter: Payer: Self-pay | Admitting: Nurse Practitioner

## 2014-03-15 ENCOUNTER — Other Ambulatory Visit: Payer: Self-pay | Admitting: Podiatry

## 2014-03-15 DIAGNOSIS — M7989 Other specified soft tissue disorders: Secondary | ICD-10-CM

## 2014-03-15 DIAGNOSIS — S93324S Dislocation of tarsometatarsal joint of right foot, sequela: Secondary | ICD-10-CM

## 2014-03-15 DIAGNOSIS — M79671 Pain in right foot: Secondary | ICD-10-CM

## 2014-03-22 ENCOUNTER — Other Ambulatory Visit: Payer: 59

## 2014-03-23 ENCOUNTER — Other Ambulatory Visit: Payer: 59

## 2014-03-25 ENCOUNTER — Ambulatory Visit
Admission: RE | Admit: 2014-03-25 | Discharge: 2014-03-25 | Disposition: A | Discharge status: Home / Self Care | Payer: 59 | Source: Ambulatory Visit | Attending: Podiatry | Admitting: Podiatry

## 2014-03-25 DIAGNOSIS — M79671 Pain in right foot: Secondary | ICD-10-CM

## 2014-03-25 DIAGNOSIS — M7989 Other specified soft tissue disorders: Secondary | ICD-10-CM

## 2014-03-25 DIAGNOSIS — S93324S Dislocation of tarsometatarsal joint of right foot, sequela: Secondary | ICD-10-CM

## 2014-07-25 ENCOUNTER — Other Ambulatory Visit: Payer: Self-pay | Admitting: Emergency Medicine

## 2014-07-25 DIAGNOSIS — C50411 Malignant neoplasm of upper-outer quadrant of right female breast: Secondary | ICD-10-CM

## 2014-07-26 ENCOUNTER — Other Ambulatory Visit (HOSPITAL_BASED_OUTPATIENT_CLINIC_OR_DEPARTMENT_OTHER): Payer: 59

## 2014-07-26 ENCOUNTER — Ambulatory Visit (HOSPITAL_BASED_OUTPATIENT_CLINIC_OR_DEPARTMENT_OTHER): Payer: 59 | Admitting: Oncology

## 2014-07-26 ENCOUNTER — Telehealth: Payer: Self-pay | Admitting: Oncology

## 2014-07-26 VITALS — BP 141/87 | HR 87 | Temp 99.0°F | Resp 18 | Ht 67.0 in | Wt 169.6 lb

## 2014-07-26 DIAGNOSIS — C50811 Malignant neoplasm of overlapping sites of right female breast: Secondary | ICD-10-CM

## 2014-07-26 DIAGNOSIS — C50411 Malignant neoplasm of upper-outer quadrant of right female breast: Secondary | ICD-10-CM

## 2014-07-26 LAB — COMPREHENSIVE METABOLIC PANEL (CC13)
ALT: 19 U/L (ref 0–55)
AST: 19 U/L (ref 5–34)
Albumin: 3.6 g/dL (ref 3.5–5.0)
Alkaline Phosphatase: 58 U/L (ref 40–150)
Anion Gap: 10 mEq/L (ref 3–11)
BUN: 12.3 mg/dL (ref 7.0–26.0)
CALCIUM: 8.7 mg/dL (ref 8.4–10.4)
CO2: 26 mEq/L (ref 22–29)
Chloride: 104 mEq/L (ref 98–109)
Creatinine: 0.7 mg/dL (ref 0.6–1.1)
EGFR: >90 mL/min/{1.73_m2} (ref >90)
Glucose: 112 mg/dl (ref 70–140)
POTASSIUM: 4.2 meq/L (ref 3.5–5.1)
SODIUM: 140 meq/L (ref 136–145)
Total Bilirubin: 0.22 mg/dL (ref 0.20–1.20)
Total Protein: 6.7 g/dL (ref 6.4–8.3)

## 2014-07-26 LAB — CBC WITH DIFFERENTIAL/PLATELET
BASO%: 0.5 % (ref 0.0–2.0)
Basophils Absolute: 0 10*3/uL (ref 0.0–0.1)
EOS ABS: 0.2 10*3/uL (ref 0.0–0.5)
EOS%: 2.8 % (ref 0.0–7.0)
HCT: 39.2 % (ref 34.8–46.6)
HGB: 13 g/dL (ref 11.6–15.9)
LYMPH%: 28.7 % (ref 14.0–49.7)
MCH: 33.2 pg (ref 25.1–34.0)
MCHC: 33.2 g/dL (ref 31.5–36.0)
MCV: 100 fL (ref 79.5–101.0)
MONO#: 0.8 10*3/uL (ref 0.1–0.9)
MONO%: 8.6 % (ref 0.0–14.0)
NEUT%: 59.4 % (ref 38.4–76.8)
NEUTROS ABS: 5.2 10*3/uL (ref 1.5–6.5)
PLATELETS: 242 10*3/uL (ref 145–400)
RBC: 3.92 10*6/uL (ref 3.70–5.45)
RDW: 13 % (ref 11.2–14.5)
WBC: 8.7 10*3/uL (ref 3.9–10.3)
lymph#: 2.5 10*3/uL (ref 0.9–3.3)

## 2014-07-26 MED ORDER — TAMOXIFEN CITRATE 20 MG PO TABS: 20.0000 mg | ORAL_TABLET | Freq: Every day | ORAL | Status: DC

## 2014-07-26 MED ORDER — VENLAFAXINE HCL ER 150 MG PO CP24: 150.0000 mg | ORAL_CAPSULE | Freq: Every day | ORAL | Status: DC

## 2014-07-26 NOTE — Telephone Encounter (Signed)
appts made and avs printed for pt  Kelly Mills °

## 2014-07-26 NOTE — Progress Notes (Signed)
[place]  Telephone:(336) [phone] Fax:(336) [phone]  OFFICE PROGRESS NOTE   ID: Kelly Mills   DOB: 11-Apr-1971  MR#: [id number]  JYN#:[id number]   PCP: Kelly Mills SU: Kelly Bookbinder, MD OTHER MD: Kelly Koh, MD   HISTORY OF PRESENT ILLNESS: From Dr. Collier Salina Mills's new patient evaluation dated 07/17/2011:  "The patient has been in good health. She has had previous mammograms. Last mammogram was in May 2012. She self detected a mass in her left breast in January of this year. She sought medical attention for this and was referred for mammogram and ultrasound. This took place on 07/03/2011. At that time a firm palpable mass was noted at 10:00 position. This measured 1.8 x 1.7 x 1.6 cm. Prominent lymph node was seen in the axilla. Biopsies of both areas were was performed. Both the breast and lymph node showed invasive mammary carcinoma he ER status was 98% PR +80%, proliferative index 84% HER-2 was negative with a ratio 0.96 MRI scan of both breasts performed 07/13/2011 showed a mass measuring 3.3 x 2.3 x 2 cm. In addition there was an enhancing nodule located 8 mm posterior inferior to the larger mass measuring 1.1 x 0.8 x 0.9 cm. In aggregate this total area measured 4.2 cm."  Her subsequent history is as detailed below.     INTERVAL HISTORY: Kelly Mills returns today for follow up of her breast cancer. The interval history is generally unremarkable from a breast cancer point of view. She continues on tamoxifen, which she is tolerating well. The one side effect is hot flashes and those are well-controlled on her current dose of venlafaxine.  REVIEW OF SYSTEMS: Kelly Mills will be formally divorced April of this year. She and her husband Kelly Mills the 2 daughters, currently 51 and 22 and 102 years old, and they seem to be doing well in school and well generally. Kelly Mills exercises by walking about a mile daily with her dog and also doing yoga. She has some numbness  in her toes at times, bruises easily, and has aches and pains here and there, especially the right foot, which are not more persistent or intense than before. A detailed review of systems today was otherwise stable.  PAST MEDICAL HISTORY: Past Medical History  Diagnosis Date  . History of chemotherapy     completed 11/13/11  . Breast cancer     right  . History of radiation therapy 01/06/12-02/25/12    right breast,5040 cgy/28 sessions, boost 1440 cGy/8 sessions  . Insomnia     PAST SURGICAL HISTORY: Past Surgical History  Procedure Laterality Date  . Nasal septum surgery    . Portacath placement  07/22/2011    Procedure: INSERTION PORT-A-CATH;  Surgeon: Kelly Bookbinder, MD;  Location: [facility];  Service: General;  Laterality: N/A;  Insertion of Port-A-Cath  . Axillary lymph node dissection  12/04/2011    Procedure: AXILLARY LYMPH NODE DISSECTION;  Surgeon: Kelly Bookbinder, MD;  Location: [facility];  Service: General;  Laterality: Right;  . Port-a-cath removal  12/04/2011    Procedure: REMOVAL PORT-A-CATH;  Surgeon: Kelly Bookbinder, MD;  Location: [facility];  Service: General;  Laterality: Left;  . Breast lumpectomy  12/04/11    right, ER/PR +, HER 2 -    FAMILY HISTORY Family History  Problem Relation Age of Onset  . Cancer Maternal Grandfather     melanoma  . Anesthesia problems Neg Hx   . Diabetes Paternal Grandfather   . Arthritis Mother   .  Hypertension Father   . Psoriasis Brother     GYNECOLOGIC HISTORY: G5P2.  Hx of frequent clomid injections and other fertility drugs over 2 yrs.  has had very intermittent periods in the last year and a half. She has a copper IUD in place  SOCIAL HISTORY: Mrs. Kelly Mills and her husband are separated, but both reside in [place], [place].  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  They have 2 daughters aged 2 and 79.  ADVANCED DIRECTIVES: Not on file; at the  07/26/2014 visit the patient was given the appropriate documents 2 complete and notarize at her discretion  HEALTH MAINTENANCE: History  Substance Use Topics  . Smoking status: Never Smoker   . Smokeless tobacco: Never Used  . Alcohol Use: Yes     Comment: socially    Colonoscopy: N/A PAP: Not on file Bone density: Not on file Lipid panel: Not on file  Allergies  Allergen Reactions  . Codeine Nausea Only    Current Outpatient Prescriptions  Medication Sig Dispense Refill  . ibuprofen (ADVIL,MOTRIN) 200 MG tablet Take 200 mg by mouth every 6 (six) hours as needed.    . Multiple Vitamin (MULTIVITAMIN) capsule Take 1 capsule by mouth daily.    . tamoxifen (NOLVADEX) 20 MG tablet TAKE 1 TABLET BY MOUTH DAILY 90 tablet 1  . traZODone (DESYREL) 100 MG tablet Take 1 tablet (100 mg total) by mouth at bedtime as needed. 30 tablet 12  . venlafaxine XR (EFFEXOR-XR) 150 MG 24 hr capsule Take 150 mg by mouth daily with breakfast.    . zolpidem (AMBIEN) 5 MG tablet Take 1 tablet (5 mg total) by mouth at bedtime as needed. 30 tablet 0  . zolpidem (AMBIEN) 5 MG tablet Take 5 mg by mouth at bedtime as needed for sleep.     No current facility-administered medications for this visit.    OBJECTIVE: Middle-aged white woman who appears well Filed Vitals:   07/26/14 0913  BP: 141/87  Pulse: 87  Temp: 99 F (37.2 C)  Resp: 18     Body mass index is 26.56 kg/(m^2).      ECOG FS:  0  Sclerae unicteric, pupils equal and reactive Oropharynx clear and moist-- no thrush No cervical or supraclavicular adenopathy Lungs no rales or rhonchi Heart regular rate and rhythm Abd soft, nontender, positive bowel sounds MSK no focal spinal tenderness, no upper extremity lymphedema Neuro: nonfocal, well oriented, appropriate affect Breasts: The right breast is status post lumpectomy and radiation. There is no evidence of local recurrence. The left breast is unremarkable.  Marland Kitchen  LAB RESULTS: Lab Results   Component Value Date   WBC 8.7 07/26/2014   NEUTROABS 5.2 07/26/2014   HGB 13.0 07/26/2014   HCT 39.2 07/26/2014   MCV 100.0 07/26/2014   PLT 242 07/26/2014      Chemistry      Component Value Date/Time   NA 140 01/25/2014 1305   NA 137 12/03/2011 1213   K 3.7 01/25/2014 1305   K 3.8 12/03/2011 1213   CL 101 08/18/2012 1059   CL 101 12/03/2011 1213   CO2 22 01/25/2014 1305   CO2 28 12/03/2011 1213   BUN 16.3 01/25/2014 1305   BUN 10 12/03/2011 1213   CREATININE 0.7 01/25/2014 1305   CREATININE 0.56 12/03/2011 1213      Component Value Date/Time   CALCIUM 8.6 01/25/2014 1305   CALCIUM 9.1 12/03/2011 1213  ALKPHOS 65 01/25/2014 1305   ALKPHOS 87 11/13/2011 1303   AST 67* 01/25/2014 1305   AST 18 11/13/2011 1303   ALT 44 01/25/2014 1305   ALT 16 11/13/2011 1303   BILITOT 0.29 01/25/2014 1305   BILITOT 0.3 11/13/2011 1303      Lab Results  Component Value Date   LABCA2 16 04/10/2012    Urinalysis No results found for: COLORURINE  STUDIES: EXAM: DUAL X-RAY ABSORPTIOMETRY (DXA) FOR BONE MINERAL DENSITY  FINDINGS: AP LUMBAR SPINE L1-L4  Bone Mineral Density (BMD): 1.084 g/cm2  Z-Score: 0.7  Left FEMUR neck  Bone Mineral Density (BMD): 0.720 g/cm2  Z-Score: -0.8  ASSESSMENT: The patient's Z-score is within expected range for age  FRACTURE RISK: Not increased  FRAX: World Health Organization FRAX assessment of absolute fracture risk is not calculated for this patient because of premenopausal status.  COMPARISON: None.  ASSESSMENT: 44 y.o. BRCA negative [place]:  1.  Status post right breast needle core biopsy at the 9 o'clock position on 07/08/2011 which showed invasive mammary carcinoma, ER 98%, PR 80%, Ki-67 84%, HER-2/neu negative.  She also had a right needle core biopsy of right axilla which was positive for metastatic mammary carcinoma.  2.  Status post bilateral breast MRI on 07/13/2011 which showed an irregular  enhancing mass located laterally within the posterior third of the right breast at the 9 o'clock position with central clip artifact consistent with the diagnosed invasive mammary carcinoma. The mass measured 3.3 x 2.3 x 2.0 cm in size. There was an enhancing 1.1 x 0.8 x 0.9 cm satellite nodule located 8 mm posterior and inferior to the larger mass. In aggregate the larger mass and the satellite nodule  measure 4.2 cm in greatest dimension. There were no additional or worrisome enhancing lesions within the right breast  or within the left breast.  Two adjacent abnormal appearing level I right axillary lymph nodes worrisome for metastatic lymph nodes.  3.  Status post right needle core biopsy on 07/19/2011 at the 7 o'clock position which showed invasive mammary carcinoma ER 98%, PR 26%, Ki-67 83%, HER-2/neu negative.  4.  A genetic counseling and testing letter dated 07/26/2011 showed the patient was negative for the BRCA1 and BRCA2 gene mutations.  5.  Status post neoadjuvant chemotherapy with FEC x4 cycles with Neulasta support from 08/07/2011 through 09/18/2011.  This was followed by neoadjuvant Taxotere x4 cycles with Neulasta support from 10/03/2011 through 11/13/2011.  6.  Status post right breast lumpectomy with regional node resection of right axillary contents on 12/03/2012 for a stage IIB, ypT1c, pN1a, invasive ductal carcinoma, grade 1, 3/8 lymph nodes positive for metastatic mammary carcinoma (55m, 341m and 58m658mwith extracapsular tumor extension identified, prognostic profile was not repeated.  There was insufficient invasive carcinoma present for HER-2 analysis.  7.  Status post radiation therapy from 01/06/2012 through 02/25/2012..  8.  Tamoxifen started in 02/2012.  PLAN: AdrShuntel very likely postmenopausal at this point, although she has had a couple of "periods" within the last year and a half, she tells me. Accordingly we discussed the possibility of switching to an aromatase  inhibitor. The only real advantage is that that would allow her to complete her anti-estrogens within 5 years of starting.  We did discuss the possible toxicities, side effects and complications of those agents as well as reviewed dose of tamoxifen.  After much discussion what I recommended and what she would like to do as well is to continue tamoxifen to  a total of 5 years, and then switch to an aromatase inhibitor for an additional 5 years. I think that would optimize her antiestrogen therapy. In younger patients like her of course we do prefer to cover for about 10 years.  Accordingly she will see [place] again in 6 months. She will have her next mammogram in June. I have urged her to complete a healthcare power of attorney form before her next visit here and bring it so we can scanned in her chart. Otherwise she knows to call for any problems that may develop before her next visit.  Chauncey Cruel, MD  07/26/2014, 9:27 AM

## 2014-07-28 ENCOUNTER — Other Ambulatory Visit (HOSPITAL_COMMUNITY)
Admission: RE | Admit: 2014-07-28 | Discharge: 2014-07-28 | Disposition: A | Discharge status: Home / Self Care | Payer: 59 | Source: Ambulatory Visit | Attending: Family Medicine | Admitting: Family Medicine

## 2014-07-28 ENCOUNTER — Other Ambulatory Visit: Payer: Self-pay | Admitting: Family Medicine

## 2014-07-28 DIAGNOSIS — Z124 Encounter for screening for malignant neoplasm of cervix: Secondary | ICD-10-CM | POA: Insufficient documentation

## 2014-07-28 DIAGNOSIS — Z113 Encounter for screening for infections with a predominantly sexual mode of transmission: Secondary | ICD-10-CM | POA: Diagnosis present

## 2014-07-28 DIAGNOSIS — Z1151 Encounter for screening for human papillomavirus (HPV): Secondary | ICD-10-CM | POA: Diagnosis present

## 2014-08-02 LAB — CYTOLOGY - PAP

## 2014-08-05 ENCOUNTER — Other Ambulatory Visit: Payer: Self-pay | Admitting: Nurse Practitioner

## 2015-01-17 ENCOUNTER — Other Ambulatory Visit: Payer: Self-pay | Admitting: *Deleted

## 2015-01-17 DIAGNOSIS — C50411 Malignant neoplasm of upper-outer quadrant of right female breast: Secondary | ICD-10-CM

## 2015-01-18 ENCOUNTER — Other Ambulatory Visit: Payer: 59

## 2015-01-19 ENCOUNTER — Other Ambulatory Visit: Payer: Self-pay | Admitting: *Deleted

## 2015-01-19 ENCOUNTER — Telehealth: Payer: Self-pay | Admitting: *Deleted

## 2015-01-19 ENCOUNTER — Telehealth: Payer: Self-pay | Admitting: Oncology

## 2015-01-19 NOTE — Telephone Encounter (Signed)
Called to reschedule pt's missed lab appt. Pt had forgotten about lab appt yesterday, but will be r/s for the 14th. Pt verbalized understanding and agreed with the plan. Message to be fwd to Gentry Fitz, NP.

## 2015-01-19 NOTE — Telephone Encounter (Signed)
lvm for pt regarding to added appt.Marland KitchenMarland KitchenMarland KitchenMarland Kitchenpt ok and aware

## 2015-01-25 ENCOUNTER — Telehealth: Payer: Self-pay | Admitting: Oncology

## 2015-01-25 ENCOUNTER — Encounter: Payer: Self-pay | Admitting: Nurse Practitioner

## 2015-01-25 ENCOUNTER — Other Ambulatory Visit (HOSPITAL_BASED_OUTPATIENT_CLINIC_OR_DEPARTMENT_OTHER): Payer: 59

## 2015-01-25 ENCOUNTER — Other Ambulatory Visit: Payer: Self-pay | Admitting: *Deleted

## 2015-01-25 ENCOUNTER — Ambulatory Visit (HOSPITAL_BASED_OUTPATIENT_CLINIC_OR_DEPARTMENT_OTHER): Payer: 59 | Admitting: Nurse Practitioner

## 2015-01-25 VITALS — BP 150/80 | HR 95 | Temp 97.7°F | Resp 18 | Ht 67.0 in | Wt 172.7 lb

## 2015-01-25 DIAGNOSIS — C50411 Malignant neoplasm of upper-outer quadrant of right female breast: Secondary | ICD-10-CM

## 2015-01-25 DIAGNOSIS — C50811 Malignant neoplasm of overlapping sites of right female breast: Secondary | ICD-10-CM | POA: Diagnosis not present

## 2015-01-25 DIAGNOSIS — E876 Hypokalemia: Secondary | ICD-10-CM

## 2015-01-25 DIAGNOSIS — Z23 Encounter for immunization: Secondary | ICD-10-CM | POA: Diagnosis not present

## 2015-01-25 DIAGNOSIS — C773 Secondary and unspecified malignant neoplasm of axilla and upper limb lymph nodes: Secondary | ICD-10-CM | POA: Diagnosis not present

## 2015-01-25 DIAGNOSIS — Z170 Estrogen receptor positive status [ER+]: Secondary | ICD-10-CM

## 2015-01-25 DIAGNOSIS — Z79810 Long term (current) use of selective estrogen receptor modulators (SERMs): Secondary | ICD-10-CM

## 2015-01-25 DIAGNOSIS — F1010 Alcohol abuse, uncomplicated: Secondary | ICD-10-CM | POA: Insufficient documentation

## 2015-01-25 LAB — CBC WITH DIFFERENTIAL/PLATELET
BASO%: 0.6 % (ref 0.0–2.0)
Basophils Absolute: 0 10*3/uL (ref 0.0–0.1)
EOS%: 3.9 % (ref 0.0–7.0)
Eosinophils Absolute: 0.3 10*3/uL (ref 0.0–0.5)
HCT: 40.4 % (ref 34.8–46.6)
HGB: 13.6 g/dL (ref 11.6–15.9)
LYMPH%: 26.1 % (ref 14.0–49.7)
MCH: 33.8 pg (ref 25.1–34.0)
MCHC: 33.7 g/dL (ref 31.5–36.0)
MCV: 100.5 fL (ref 79.5–101.0)
MONO#: 0.6 10*3/uL (ref 0.1–0.9)
MONO%: 8.3 % (ref 0.0–14.0)
NEUT%: 61.1 % (ref 38.4–76.8)
NEUTROS ABS: 4.3 10*3/uL (ref 1.5–6.5)
PLATELETS: 179 10*3/uL (ref 145–400)
RBC: 4.02 10*6/uL (ref 3.70–5.45)
RDW: 13.6 % (ref 11.2–14.5)
WBC: 7 10*3/uL (ref 3.9–10.3)
lymph#: 1.8 10*3/uL (ref 0.9–3.3)

## 2015-01-25 LAB — COMPREHENSIVE METABOLIC PANEL (CC13)
ALT: 33 U/L (ref 0–55)
ANION GAP: 10 meq/L (ref 3–11)
AST: 33 U/L (ref 5–34)
Albumin: 3.5 g/dL (ref 3.5–5.0)
Alkaline Phosphatase: 66 U/L (ref 40–150)
BUN: 12.8 mg/dL (ref 7.0–26.0)
CHLORIDE: 105 meq/L (ref 98–109)
CO2: 25 mEq/L (ref 22–29)
Calcium: 9.4 mg/dL (ref 8.4–10.4)
Creatinine: 0.8 mg/dL (ref 0.6–1.1)
EGFR: >90 mL/min/{1.73_m2} (ref >90)
GLUCOSE: 110 mg/dL (ref 70–140)
Potassium: 2.9 mEq/L — CL (ref 3.5–5.1)
SODIUM: 141 meq/L (ref 136–145)
Total Bilirubin: 0.56 mg/dL (ref 0.20–1.20)
Total Protein: 6.7 g/dL (ref 6.4–8.3)

## 2015-01-25 MED ORDER — INFLUENZA VAC SPLIT QUAD 0.5 ML IM SUSY
0.5000 mL | PREFILLED_SYRINGE | Freq: Once | INTRAMUSCULAR | Status: AC
  Administered 2015-01-25: 0.5 mL via INTRAMUSCULAR
  Filled 2015-01-25: qty 0.5

## 2015-01-25 MED ORDER — TAMOXIFEN CITRATE 20 MG PO TABS
20.0000 mg | ORAL_TABLET | Freq: Every day | ORAL | Status: DC
Start: 2015-01-25 — End: 2016-01-10

## 2015-01-25 MED ORDER — POTASSIUM CHLORIDE ER 10 MEQ PO CPCR: 20.0000 meq | ORAL_CAPSULE | Freq: Two times a day (BID) | ORAL | Status: DC

## 2015-01-25 NOTE — Telephone Encounter (Signed)
Appointments made and avs printed for pateint °

## 2015-01-25 NOTE — Progress Notes (Signed)
[facility]  Telephone:(336) [phone] Fax:(336) [phone]  OFFICE PROGRESS NOTE   ID: TAWNA ALWIN   DOB: 16-May-1970  MR#: [id number]  HER#:[id number]   PCP: Wynelle Fanny SU: Rolm Bookbinder, MD OTHER MD: Arloa Koh, MD  CHIEF COMPLAINT: left breast cancer  CURRENT TREATMENT: tamoxifen daily  BREAST CANCER HISTORY: From Dr. Collier Salina Rubin's new patient evaluation dated 07/17/2011:  "The patient has been in good health. She has had previous mammograms. Last mammogram was in May 2012. She self detected a mass in her left breast in January of this year. She sought medical attention for this and was referred for mammogram and ultrasound. This took place on 07/03/2011. At that time a firm palpable mass was noted at 10:00 position. This measured 1.8 x 1.7 x 1.6 cm. Prominent lymph node was seen in the axilla. Biopsies of both areas were was performed. Both the breast and lymph node showed invasive mammary carcinoma he ER status was 98% PR +80%, proliferative index 84% HER-2 was negative with a ratio 0.96 MRI scan of both breasts performed 07/13/2011 showed a mass measuring 3.3 x 2.3 x 2 cm. In addition there was an enhancing nodule located 8 mm posterior inferior to the larger mass measuring 1.1 x 0.8 x 0.9 cm. In aggregate this total area measured 4.2 cm."  Her subsequent history is as detailed below.     INTERVAL HISTORY: Jon returns today for follow up of her breast cancer. She has been on tamoxifen since October 2013 and tolerates this well. She is on 55m venlafaxine daily (despite the prescription reading 1544m and this controls her hot flashes well. She denies vaginal changes. The interval history is remarkable for her official divorce from her husband in the spring. She had sought therapy throughout this trial in her life, but finally realized that she often drank to "self medicate." Though she tells me she does not drink at work, first thing in the morning,  or to the point where she passes out, she realized that she was using alcohol inappropriate and as a drug. She plans to admit herself into a 6 week rehabilitation program some time this Fall.  REVIEW OF SYSTEMS: Ariana has no complaints to offer today. She has sold the house she shared with her husband and her parents are helping her afford a mortgage on a townhome close to her daughters' school. She will alternate weeks of responsibility with her husband. She endorses anxiety and depression, but believes her mood is getting better since the divorce. A detailed review of systems is otherwise stable.  PAST MEDICAL HISTORY: Past Medical History  Diagnosis Date  . History of chemotherapy     completed 11/13/11  . Breast cancer     right  . History of radiation therapy 01/06/12-02/25/12    right breast,5040 cgy/28 sessions, boost 1440 cGy/8 sessions  . Insomnia     PAST SURGICAL HISTORY: Past Surgical History  Procedure Laterality Date  . Nasal septum surgery    . Portacath placement  07/22/2011    Procedure: INSERTION PORT-A-CATH;  Surgeon: MaRolm BookbinderMD;  [place] [facility] Service: General;  Laterality: N/A;  Insertion of Port-A-Cath  . Axillary lymph node dissection  12/04/2011    Procedure: AXILLARY LYMPH NODE DISSECTION;  Surgeon: MaRolm BookbinderMD;  [place] [facility] Service: General;  Laterality: Right;  . Port-a-cath removal  12/04/2011    Procedure: REMOVAL PORT-A-CATH;  Surgeon: MaRolm BookbinderMD;  [place] [facility]  Service: General;  Laterality: Left;  . Breast lumpectomy  12/04/11    right, ER/PR +, HER 2 -    FAMILY HISTORY Family History  Problem Relation Age of Onset  . Cancer Maternal Grandfather     melanoma  . Anesthesia problems Neg Hx   . Diabetes Paternal Grandfather   . Arthritis Mother   . Hypertension Father   . Psoriasis Brother     GYNECOLOGIC HISTORY: G5P2.  Hx of frequent clomid injections and other  fertility drugs over 2 yrs.  has had very intermittent periods in the last year and a half. She has a copper IUD in place  SOCIAL HISTORY: [name] and her husband are divorced, but both reside in [place], [place].  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  They have 2 daughters aged 45 and 40.  ADVANCED DIRECTIVES: Not on file; at the 45/15/2016 visit the patient was given the appropriate documents 2 complete and notarize at her discretion  HEALTH MAINTENANCE: Social History  Substance Use Topics  . Smoking status: Never Smoker   . Smokeless tobacco: Never Used  . Alcohol Use: Yes     Comment: socially    Colonoscopy: N/A PAP: Not on file Bone density: Not on file Lipid panel: Not on file  Allergies  Allergen Reactions  . Codeine Nausea Only    Current Outpatient Prescriptions  Medication Sig Dispense Refill  . ibuprofen (ADVIL,MOTRIN) 200 MG tablet Take 200 mg by mouth every 6 (six) hours as needed.    . Multiple Vitamin (MULTIVITAMIN) capsule Take 1 capsule by mouth daily.    Marland Kitchen venlafaxine XR (EFFEXOR-XR) 150 MG 24 hr capsule Take 1 capsule (150 mg total) by mouth daily with breakfast. (Patient taking differently: Take 150 mg by mouth daily with breakfast. Pt takes 75 mg daily.) 30 capsule 4  . potassium chloride (MICRO-K) 10 MEQ CR capsule Take 2 capsules (20 mEq total) by mouth 2 (two) times daily. 12 capsule 0  . tamoxifen (NOLVADEX) 20 MG tablet Take 1 tablet (20 mg total) by mouth daily. 90 tablet 2  . traZODone (DESYREL) 100 MG tablet Take 1 tablet (100 mg total) by mouth at bedtime as needed. (Patient not taking: Reported on 01/25/2015) 30 tablet 12  . zolpidem (AMBIEN) 5 MG tablet Take 1 tablet (5 mg total) by mouth at bedtime as needed. 30 tablet 0   No current facility-administered medications for this visit.    OBJECTIVE: Middle-aged white woman who appears well Filed Vitals:   01/25/15 0853  BP: 150/80  Pulse:  95  Temp: 97.7 F (36.5 C)  Resp: 18     Body mass index is 27.04 kg/(m^2).      ECOG FS:  0  Skin: warm, dry  HEENT: sclerae anicteric, conjunctivae pink, oropharynx clear. No thrush or mucositis.  Lymph Nodes: No cervical or supraclavicular lymphadenopathy  Lungs: clear to auscultation bilaterally, no rales, wheezes, or rhonci  Heart: regular rate and rhythm  Abdomen: round, soft, non tender, positive bowel sounds  Musculoskeletal: No focal spinal tenderness, no peripheral edema  Neuro: non focal, well oriented, positive affect  Breasts: right breast status post lumpectomy and radiation. No evidence of recurrent disease. Right axilla benign. Left breast unremarkable.   LAB RESULTS: Lab Results  Component Value Date   WBC 7.0 01/25/2015   NEUTROABS 4.3 01/25/2015   HGB 13.6 01/25/2015   HCT 40.4 01/25/2015   MCV 100.5 01/25/2015  PLT 179 01/25/2015      Chemistry      Component Value Date/Time   NA 141 01/25/2015 0841   NA 137 12/03/2011 1213   K 2.9* 01/25/2015 0841   K 3.8 12/03/2011 1213   CL 101 08/18/2012 1059   CL 101 12/03/2011 1213   CO2 25 01/25/2015 0841   CO2 28 12/03/2011 1213   BUN 12.8 01/25/2015 0841   BUN 10 12/03/2011 1213   CREATININE 0.8 01/25/2015 0841   CREATININE 0.56 12/03/2011 1213      Component Value Date/Time   CALCIUM 9.4 01/25/2015 0841   CALCIUM 9.1 12/03/2011 1213   ALKPHOS 66 01/25/2015 0841   ALKPHOS 87 11/13/2011 1303   AST 33 01/25/2015 0841   AST 18 11/13/2011 1303   ALT 33 01/25/2015 0841   ALT 16 11/13/2011 1303   BILITOT 0.56 01/25/2015 0841   BILITOT 0.3 11/13/2011 1303      Lab Results  Component Value Date   LABCA2 16 04/10/2012    Urinalysis No results found for: COLORURINE  STUDIES: No results found.   ASSESSMENT: 44 y.o. BRCA negative [place]:  1.  Status post right breast needle core biopsy at the 9 o'clock position on 07/08/2011 which showed invasive mammary carcinoma, ER 98%, PR 80%,  Ki-67 84%, HER-2/neu negative.  She also had a right needle core biopsy of right axilla which was positive for metastatic mammary carcinoma.  2.  Status post bilateral breast MRI on 07/13/2011 which showed an irregular enhancing mass located laterally within the posterior third of the right breast at the 9 o'clock position with central clip artifact consistent with the diagnosed invasive mammary carcinoma. The mass measured 3.3 x 2.3 x 2.0 cm in size. There was an enhancing 1.1 x 0.8 x 0.9 cm satellite nodule located 8 mm posterior and inferior to the larger mass. In aggregate the larger mass and the satellite nodule  measure 4.2 cm in greatest dimension. There were no additional or worrisome enhancing lesions within the right breast  or within the left breast.  Two adjacent abnormal appearing level I right axillary lymph nodes worrisome for metastatic lymph nodes.  3.  Status post right needle core biopsy on 07/19/2011 at the 7 o'clock position which showed invasive mammary carcinoma ER 98%, PR 26%, Ki-67 83%, HER-2/neu negative.  4.  A genetic counseling and testing letter dated 07/26/2011 showed the patient was negative for the BRCA1 and BRCA2 gene mutations.  5.  Status post neoadjuvant chemotherapy with FEC x4 cycles with Neulasta support from 08/07/2011 through 09/18/2011.  This was followed by neoadjuvant Taxotere x4 cycles with Neulasta support from 10/03/2011 through 11/13/2011.  6.  Status post right breast lumpectomy with regional node resection of right axillary contents on 12/03/2012 for a stage IIB, ypT1c, pN1a, invasive ductal carcinoma, grade 1, 3/8 lymph nodes positive for metastatic mammary carcinoma (11m, 328m and 80m68mwith extracapsular tumor extension identified, prognostic profile was not repeated.  There was insufficient invasive carcinoma present for HER-2 analysis.  7.  Status post radiation therapy from 01/06/2012 through 02/25/2012..  8.  Tamoxifen started in 02/2012.  9.  Alcohol abuse - plans to admit herself into a 6 week rehabilitation program in Fall 2016  PLAN: As far as her breast cancer is concerned, Kirstie is doing well. She is now 2 years out from her definitve surgery with no evidence of recurrent disease. She is tolerating the tamoxifen well with few complaints beyond hot flashes. She will continue on  this drug for 5 years, before switching to anastrozole for an additional 5 years. The labs were reviewed in detail and were normal, except for a critically low potassium of 2.9. We reviewed a list of high potassium foods, and I prescribed her 3 days of 40mq potassium BID.   Cristel is overdue for a repeat mammogram, but will have this done when she returns from rehab. She is very optimistic that this will be a good experience for her.   Diamonds will return in 6 months for labs and a follow up visit. She understands and agrees with this plan. She knows the goal of treatment in her case is cure. She has been encouraged to call with any issues that might arise before her next visit here.  HLaurie Panda NP  01/25/2015, 10:36 AM

## 2015-07-14 ENCOUNTER — Other Ambulatory Visit: Payer: Self-pay | Admitting: Oncology

## 2015-07-14 DIAGNOSIS — Z9011 Acquired absence of right breast and nipple: Secondary | ICD-10-CM

## 2015-07-17 ENCOUNTER — Other Ambulatory Visit: Payer: Self-pay | Admitting: *Deleted

## 2015-07-17 DIAGNOSIS — C50411 Malignant neoplasm of upper-outer quadrant of right female breast: Secondary | ICD-10-CM

## 2015-07-18 ENCOUNTER — Other Ambulatory Visit: Payer: 59

## 2015-07-18 ENCOUNTER — Ambulatory Visit: Payer: 59 | Admitting: Oncology

## 2015-07-19 NOTE — Progress Notes (Signed)
no show

## 2015-07-21 ENCOUNTER — Inpatient Hospital Stay: Admission: RE | Admit: 2015-07-21 | Payer: 59 | Source: Ambulatory Visit

## 2015-07-28 ENCOUNTER — Ambulatory Visit
Admission: RE | Admit: 2015-07-28 | Discharge: 2015-07-28 | Disposition: A | Discharge status: Home / Self Care | Payer: 59 | Source: Ambulatory Visit | Attending: Oncology | Admitting: Oncology

## 2015-07-28 DIAGNOSIS — Z9011 Acquired absence of right breast and nipple: Secondary | ICD-10-CM

## 2015-11-03 ENCOUNTER — Encounter: Payer: Self-pay | Admitting: Genetic Counselor

## 2015-12-31 ENCOUNTER — Encounter (HOSPITAL_COMMUNITY): Payer: Self-pay | Admitting: Behavioral Health

## 2015-12-31 ENCOUNTER — Ambulatory Visit (HOSPITAL_COMMUNITY)
Admission: RE | Admit: 2015-12-31 | Discharge: 2015-12-31 | Disposition: A | Discharge status: Home / Self Care | Payer: 59 | Source: Home / Self Care | Attending: Psychiatry | Admitting: Psychiatry

## 2015-12-31 ENCOUNTER — Encounter (HOSPITAL_COMMUNITY): Payer: Self-pay

## 2015-12-31 ENCOUNTER — Emergency Department (HOSPITAL_COMMUNITY)
Admission: EM | Admit: 2015-12-31 | Discharge: 2015-12-31 | Disposition: A | Discharge status: Home / Self Care | Payer: 59 | Attending: Emergency Medicine | Admitting: Emergency Medicine

## 2015-12-31 DIAGNOSIS — F17200 Nicotine dependence, unspecified, uncomplicated: Secondary | ICD-10-CM | POA: Diagnosis not present

## 2015-12-31 DIAGNOSIS — Z853 Personal history of malignant neoplasm of breast: Secondary | ICD-10-CM | POA: Insufficient documentation

## 2015-12-31 DIAGNOSIS — F1920 Other psychoactive substance dependence, uncomplicated: Secondary | ICD-10-CM | POA: Insufficient documentation

## 2015-12-31 DIAGNOSIS — F1490 Cocaine use, unspecified, uncomplicated: Secondary | ICD-10-CM | POA: Insufficient documentation

## 2015-12-31 DIAGNOSIS — F1910 Other psychoactive substance abuse, uncomplicated: Secondary | ICD-10-CM

## 2015-12-31 DIAGNOSIS — R443 Hallucinations, unspecified: Secondary | ICD-10-CM | POA: Diagnosis not present

## 2015-12-31 HISTORY — DX: Alcohol abuse, uncomplicated: F10.10

## 2015-12-31 LAB — CBC WITH DIFFERENTIAL/PLATELET
BASOS ABS: 0 10*3/uL (ref 0.0–0.1)
BASOS PCT: 0 %
Eosinophils Absolute: 0.7 10*3/uL (ref 0.0–0.7)
Eosinophils Relative: 8 %
HEMATOCRIT: 41 % (ref 36.0–46.0)
HEMOGLOBIN: 13.3 g/dL (ref 12.0–15.0)
Lymphocytes Relative: 26 %
Lymphs Abs: 2.4 10*3/uL (ref 0.7–4.0)
MCH: 32.5 pg (ref 26.0–34.0)
MCHC: 32.4 g/dL (ref 30.0–36.0)
MCV: 100.2 fL — ABNORMAL HIGH (ref 78.0–100.0)
Monocytes Absolute: 0.6 10*3/uL (ref 0.1–1.0)
Monocytes Relative: 7 %
NEUTROS ABS: 5.5 10*3/uL (ref 1.7–7.7)
NEUTROS PCT: 59 %
Platelets: 289 10*3/uL (ref 150–400)
RBC: 4.09 MIL/uL (ref 3.87–5.11)
RDW: 13 % (ref 11.5–15.5)
WBC: 9.2 10*3/uL (ref 4.0–10.5)

## 2015-12-31 LAB — COMPREHENSIVE METABOLIC PANEL
ALBUMIN: 3.9 g/dL (ref 3.5–5.0)
ALK PHOS: 63 U/L (ref 38–126)
ALT: 18 U/L (ref 14–54)
AST: 18 U/L (ref 15–41)
Anion gap: 6 (ref 5–15)
BILIRUBIN TOTAL: 0.4 mg/dL (ref 0.3–1.2)
BUN: 14 mg/dL (ref 6–20)
CALCIUM: 9.1 mg/dL (ref 8.9–10.3)
CO2: 26 mmol/L (ref 22–32)
Chloride: 106 mmol/L (ref 101–111)
Creatinine, Ser: 0.67 mg/dL (ref 0.44–1.00)
GFR calc Af Amer: >60 mL/min (ref >60)
GFR calc non Af Amer: >60 mL/min (ref >60)
GLUCOSE: 93 mg/dL (ref 65–99)
Potassium: 3.8 mmol/L (ref 3.5–5.1)
Sodium: 138 mmol/L (ref 135–145)
TOTAL PROTEIN: 7.1 g/dL (ref 6.5–8.1)

## 2015-12-31 LAB — ETHANOL: Alcohol, Ethyl (B): <5 mg/dL (ref <5)

## 2015-12-31 NOTE — ED Notes (Signed)
Kelly Mills reports that Inpatient treatment is recommended, but she does not meet criteria for IVC.  Pt adamant that she does not want to be placed into Inpatient treatment today.  Sts she is willing to consider it, but not today.  Pt misunderstood why she was sent to [facility] by [facility].

## 2015-12-31 NOTE — ED Triage Notes (Signed)
Patient brought in by [facility] for medical clearance.  RN went into room to triage patient and when asked why patient is here, patient states "i am here for blood work".  Nurse asked patient again why she went to [facility] to nee to have blood work done here.  Patient states, " I really dont want to answer this again, I have told several people already and don't feel like it again and I just need blood work done." RN explained to patient that MD will need to know why she needs to be treated before they can just order lab work.  Patient refusing to answer and demanding to leave.  Patient calling friend to come get here.   RN made MD Tomi Bamberger and Nanvati aware.

## 2015-12-31 NOTE — ED Notes (Addendum)
Pt refusing to answer questions for [facility] staff.  Sts that she has already told several people why she is here.  Per chart review, Pt was assessed at [facility].  Pt c/o auditory and visual hallucinations since last week, recent cocaine and ETOH use, insomnia, and hopelessness.  Pt reports several stressors including separating from her husband and losing custody of her children.  Denies SI/HI.

## 2015-12-31 NOTE — ED Notes (Signed)
Pt is very agitated.  Sts she does not want to be placed at [facility].  Sts she only went there to see what her options are.

## 2015-12-31 NOTE — ED Notes (Signed)
Verbalized understanding discharge instructions and follow-up. In no acute distress.   

## 2015-12-31 NOTE — BH Assessment (Addendum)
Assessment Note  Kelly Mills is a 45 y.o. female who presented voluntarily to [facility] as a walk-in.  She was referred by friends and also by her addictions counselor, [name].  Pt reported as follows:  She stated that over the last year, she has experience significant psycho-social stressors, namely separation from her husband involving the loss of custody of her two children, a job loss, and a DWI.  Pt described herself as an alcoholic, and she stated that she volunteered for inpatient treatment for alcoholism at [facility] in October 2016.  Pt's current complaint is that she experienced auditory and visual hallucinations last week.  Pt stated that she has not been sleeping well, and that over the past week, she used $40 worth of cocaine.  She stated that she left her townhome while it was being treated for mice infestation, and that while she was staying at another home, she experienced the sounds of mice, and while she was in her car, she experienced the visual hallucination of a house.  Pt conceded that she was under the influence of cocaine when this happened.  Pt denied suicidal ideation, any past suicide attempts, homicidal ideation, or any current auditory/visual hallucination.  She is currently seeing an Personal assistant and participates in a 12 step program.  Pt stated that she has recently sought the help of a psychologist and has her first appointment on Wednesday, August 23.  Pt stated that she does not take any psychotropic medication but hopes to be able to arrange a med eval on Wednesday.  During assessment, Pt presented as alert and oriented.  She was dressed in street clothes and appeared appropriately groomed.  Pt had good eye contact.  Mood was sad.  Affect was tearful.  Pt stated that she came to [facility] for advice on medication and at the request of her friends.  She denied suicidal ideation (see above).  Pt endorsed persistent sadness, insomnia, isolation, feelings of  hopelessness.  She endorsed recent use of cocaine and past use of alcohol.  Pt's  Pt's speech was normal in rate, rhythm, and volume.  Pt's memory and concentration were intact.  She denied past abuse.  Pt's insight, judgment, and impulse control were intact.    Consulted with [name], NP, who spoke with Pt separately.  Pt advised T. Starkes that she is drinking again as well as using cocaine.  Per T. Starkes, Pt to go to [facility] for medical clearance.  Pt unwilling to receive inpatient treatment, but willing to attend intensive outpatient for management of depression, anxiety, and substance use.  Diagnosis: Substance-induced psychosis; alcohol use disorder; cocaine use disorder  Past Medical History:  Past Medical History:  Diagnosis Date  . Breast cancer ([place])    right  . History of chemotherapy    completed 11/13/11  . History of radiation therapy 01/06/12-02/25/12   right breast,5040 cgy/28 sessions, boost 1440 cGy/8 sessions  . Insomnia     Past Surgical History:  Procedure Laterality Date  . AXILLARY LYMPH NODE DISSECTION  12/04/2011   Procedure: AXILLARY LYMPH NODE DISSECTION;  Surgeon: Rolm Bookbinder, MD;  Location: [facility];  Service: General;  Laterality: Right;  . BREAST LUMPECTOMY  12/04/11   right, ER/PR +, HER 2 -  . NASAL SEPTUM SURGERY    . PORT-A-CATH REMOVAL  12/04/2011   Procedure: REMOVAL PORT-A-CATH;  Surgeon: Rolm Bookbinder, MD;  Location: [place];  Service: General;  Laterality: Left;  . PORTACATH  PLACEMENT  07/22/2011   Procedure: INSERTION PORT-A-CATH;  Surgeon: Rolm Bookbinder, MD;  Location: [facility] OR;  Service: General;  Laterality: N/A;  Insertion of Port-A-Cath    Family History:  Family History  Problem Relation Age of Onset  . Cancer Maternal Grandfather     melanoma  . Diabetes Paternal Grandfather   . Arthritis Mother   . Hypertension Father   . Psoriasis Brother   . Anesthesia problems Neg Hx     Social  History:  reports that she has never smoked. She has never used smokeless tobacco. She reports that she drinks alcohol. She reports that she uses drugs, including Cocaine, about 4 times per week.  Additional Social History:  Alcohol / Drug Use Pain Medications: See PTA Prescriptions: See PTA (includes Tamoxifen, Venlafaxine) History of alcohol / drug use?: Yes Substance #1 Name of Substance 1: Cocaine 1 - Age of First Use: 43 1 - Amount (size/oz): Varied 1 - Frequency: Weekly 1 - Duration: Ongoing 1 - Last Use / Amount: 12/29/15 Substance #2 Name of Substance 2: Alcohol 2 - Age of First Use: 13 2 - Amount (size/oz): Varied -- half bottle liquor; full bottle of wine 2 - Frequency: Daily 2 - Duration: through 2017 2 - Last Use / Amount: May 2017  CIWA:   COWS:    Allergies:  Allergies  Allergen Reactions  . Codeine Nausea Only    Home Medications:  (Not in a hospital admission)  OB/GYN Status:  No LMP recorded.  General Assessment Data Location of Assessment: [facility] Assessment Services TTS Assessment: In system Is this a Tele or Face-to-Face Assessment?: Face-to-Face Is this an Initial Assessment or a Re-assessment for this encounter?: Initial Assessment Marital status: Separated Is patient pregnant?: No Pregnancy Status: No Living Arrangements: Alone (Separated from husband [name] and two daughters) Can pt return to current living arrangement?: Yes Admission Status: Voluntary Is patient capable of signing voluntary admission?: Yes Referral Source: Self/Family/Friend Insurance type: [place] Exam ([place]) Medical Exam completed: No Reason for MSE not completed: Other: (Pt presented as walk-in)  Crisis Care Plan Living Arrangements: Alone (Separated from husband [name] and two daughters) Name of Psychiatrist: None yet (Pt's first appointment is Wednesday) Name of Therapist: Elder Cyphers  Education Status Is patient currently in school?:  No  Risk to self with the past 6 months Suicidal Ideation: No Has patient been a risk to self within the past 6 months prior to admission? : No Suicidal Intent: No Has patient had any suicidal intent within the past 6 months prior to admission? : No Is patient at risk for suicide?: No Suicidal Plan?: No Has patient had any suicidal plan within the past 6 months prior to admission? : No Access to Means: No What has been your use of drugs/alcohol within the last 12 months?: Cocaine, Alcohol Previous Attempts/Gestures: No Intentional Self Injurious Behavior: None Family Suicide History: No Recent stressful life event(s): Divorce, Financial Problems, Other (Comment), Job Loss (Separated; in recovery from alcohol; financial stress) Persecutory voices/beliefs?: No Depression: Yes Depression Symptoms: Despondent, Insomnia, Tearfulness, Isolating, Loss of interest in usual pleasures Substance abuse history and/or treatment for substance abuse?: Yes (Cocaine use; alcohol use) Suicide prevention information given to non-admitted patients: Not applicable  Risk to Others within the past 6 months Homicidal Ideation: No Does patient have any lifetime risk of violence toward others beyond the six months prior to admission? : No Thoughts of Harm to Others: No Current Homicidal Intent: No  Current Homicidal Plan: No Access to Homicidal Means: No History of harm to others?: No Assessment of Violence: None Noted Does patient have access to weapons?: No Criminal Charges Pending?: Yes Describe Pending Criminal Charges: DWI, Hit/Run, [employer] driver's license Does patient have a court date: Yes Court Date: 01/05/16 Is patient on probation?: No  Psychosis Hallucinations: Visual (Pt reported seeing a mouse on Friday while under influence) Delusions: None noted  Mental Status Report Appearance/Hygiene: Unremarkable (street clothes) Eye Contact: Good Motor Activity: Unremarkable Speech:  Logical/coherent Level of Consciousness: Alert Mood: Sad Affect: Sad Anxiety Level: None Thought Processes: Coherent, Relevant Judgement: Partial Orientation: Person, Place, Time, Situation Obsessive Compulsive Thoughts/Behaviors: None  Cognitive Functioning Concentration: Normal Memory: Recent Intact, Remote Intact IQ: Average Insight: Fair Impulse Control: Fair Appetite: Fair Sleep: Decreased Vegetative Symptoms: None  ADLScreening [facility] Assessment Services) Patient's cognitive ability adequate to safely complete daily activities?: Yes Patient able to express need for assistance with ADLs?: Yes Independently performs ADLs?: Yes (appropriate for developmental age)  Prior Inpatient Therapy Prior Inpatient Therapy: Yes Prior Therapy Dates: October 2017 Prior Therapy Facilty/Provider(s): [facility] Reason for Treatment: Alcohol use  Prior Outpatient Therapy Prior Outpatient Therapy: Yes Prior Therapy Dates: Ongoing Prior Therapy Facilty/Provider(s): [name] Reason for Treatment: Alcohol use Does patient have an ACCT team?: No Does patient have Intensive In-House Services?  : No Does patient have [facility] services? : No Does patient have P4CC services?: No  ADL Screening (condition at time of admission) Patient's cognitive ability adequate to safely complete daily activities?: Yes Is the patient deaf or have difficulty hearing?: No Does the patient have difficulty seeing, even when wearing glasses/contacts?: No Does the patient have difficulty concentrating, remembering, or making decisions?: No Patient able to express need for assistance with ADLs?: Yes Does the patient have difficulty dressing or bathing?: No Independently performs ADLs?: Yes (appropriate for developmental age) Communication: Independent Does the patient have difficulty walking or climbing stairs?: No Weakness of Legs: None Weakness of Arms/Hands: None     Therapy Consults (therapy consults  require a physician order) PT Evaluation Needed: No OT Evalulation Needed: No SLP Evaluation Needed: No Abuse/Neglect Assessment (Assessment to be complete while patient is alone) Physical Abuse: Denies Verbal Abuse: Denies Sexual Abuse: Denies Exploitation of patient/patient's resources: Denies Values / Beliefs Cultural Requests During Hospitalization: None Spiritual Requests During Hospitalization: None Consults Spiritual Care Consult Needed: No Social Work Consult Needed: No      Additional Information 1:1 In Past 12 Months?: No CIRT Risk: No Elopement Risk: No Does patient have medical clearance?: No     Disposition:  Disposition Initial Assessment Completed for this Encounter: Yes Disposition of Patient: Inpatient treatment program Type of inpatient treatment program: Adult [name], NP, recommends inpatient treatment)  On Site Evaluation by:   Reviewed with Physician:    Laurena Slimmer Sekai Nayak 12/31/2015 2:31 PM

## 2015-12-31 NOTE — ED Provider Notes (Signed)
[facility] DEPT Provider Note   CSN: [id number] Arrival date & time: 12/31/15  1459     History   Chief Complaint Chief Complaint  Patient presents with  . Hallucinations    HPI Kelly Mills is a 45 y.o. female.  HPI Pt comes in with cc of medical clearance. PT was asked to come here by behavioral health, as they are seeking inpatient bed for hallucinations and substance abuse. PT reports cocaine and alcohol abuse, last use was 2 days ago. PT has no active hallucinations and she denies SI, HI. PT has no desire to come in as an inpatient, she wants to get outpatient help for now and already has a psychologist and addiction Counsellor lined up.  Past Medical History:  Diagnosis Date  . Breast cancer ([place])    right  . ETOH abuse   . History of chemotherapy    completed 11/13/11  . History of radiation therapy 01/06/12-02/25/12   right breast,5040 cgy/28 sessions, boost 1440 cGy/8 sessions  . Insomnia     Patient Active Problem List   Diagnosis Date Noted  . Alcohol abuse 01/25/2015  . Transaminitis 01/25/2014  . Hot flashes due to tamoxifen 01/25/2014  . Depression 01/25/2014  . Breast cancer of upper-outer quadrant of right female breast ([place]) 06/14/2013  . Heart palpitations 04/27/2012    Past Surgical History:  Procedure Laterality Date  . AXILLARY LYMPH NODE DISSECTION  12/04/2011   Procedure: AXILLARY LYMPH NODE DISSECTION;  Surgeon: Rolm Bookbinder, MD;  Location: [facility];  Service: General;  Laterality: Right;  . BREAST LUMPECTOMY  12/04/11   right, ER/PR +, HER 2 -  . NASAL SEPTUM SURGERY    . PORT-A-CATH REMOVAL  12/04/2011   Procedure: REMOVAL PORT-A-CATH;  Surgeon: Rolm Bookbinder, MD;  Location: [facility] [place] [facility];  Service: General;  Laterality: Left;  . PORTACATH PLACEMENT  07/22/2011   Procedure: INSERTION PORT-A-CATH;  Surgeon: Rolm Bookbinder, MD;  Location: [facility] OR;  Service: General;  Laterality: N/A;  Insertion of  Port-A-Cath    OB History    Gravida Para Term Preterm AB Living   5 2           SAB TAB Ectopic Multiple Live Births                  Obstetric Comments   2 children, freq Clomid injections, fertility drugs x 2 yrs       Home Medications    Prior to Admission medications   Medication Sig Start Date End Date Taking? Authorizing Provider  ibuprofen (ADVIL,MOTRIN) 200 MG tablet Take 200 mg by mouth every 6 (six) hours as needed.    Historical Provider, MD  Multiple Vitamin (MULTIVITAMIN) capsule Take 1 capsule by mouth daily.    Historical Provider, MD  potassium chloride (MICRO-K) 10 MEQ CR capsule Take 2 capsules (20 mEq total) by mouth 2 (two) times daily. 01/25/15   Laurie Panda, NP  tamoxifen (NOLVADEX) 20 MG tablet Take 1 tablet (20 mg total) by mouth daily. 01/25/15   Laurie Panda, NP  traZODone (DESYREL) 100 MG tablet Take 1 tablet (100 mg total) by mouth at bedtime as needed. Patient not taking: Reported on 01/25/2015 06/14/13   Chauncey Cruel, MD  venlafaxine XR (EFFEXOR-XR) 150 MG 24 hr capsule Take 1 capsule (150 mg total) by mouth daily with breakfast. Patient taking differently: Take 150 mg by mouth daily with breakfast. Pt takes 75 mg daily. 07/26/14  Chauncey Cruel, MD  zolpidem (AMBIEN) 5 MG tablet Take 1 tablet (5 mg total) by mouth at bedtime as needed. 08/18/12 08/18/13  Vivien Rota, NP    Family History Family History  Problem Relation Age of Onset  . Cancer Maternal Grandfather     melanoma  . Diabetes Paternal Grandfather   . Arthritis Mother   . Hypertension Father   . Psoriasis Brother   . Anesthesia problems Neg Hx     Social History Social History  Substance Use Topics  . Smoking status: Current Some Day Smoker  . Smokeless tobacco: Never Used  . Alcohol use Yes     Allergies   Codeine   Review of Systems Review of Systems  Constitutional: Negative for activity change.  Respiratory: Negative for shortness of breath.     Cardiovascular: Negative for chest pain.  Psychiatric/Behavioral: Positive for hallucinations. Negative for suicidal ideas.     Physical Exam Updated Vital Signs BP 148/98 (BP Location: Left Arm)   Pulse 77   Temp 97.8 F (36.6 C) (Oral)   Resp 16   SpO2 100%   Physical Exam  Constitutional: She is oriented to person, place, and time. She appears well-developed.  HENT:  Head: Normocephalic and atraumatic.  Eyes: EOM are normal.  Neck: Normal range of motion. Neck supple.  Cardiovascular: Normal rate.   Pulmonary/Chest: Effort normal.  Abdominal: Bowel sounds are normal.  Neurological: She is alert and oriented to person, place, and time.  Skin: Skin is warm and dry.  Psychiatric: She has a normal mood and affect. Her behavior is normal. Judgment and thought content normal.  Nursing note and vitals reviewed.    ED Treatments / Results  Labs (all labs ordered are listed, but only abnormal results are displayed) Labs Reviewed  CBC WITH DIFFERENTIAL/PLATELET  COMPREHENSIVE METABOLIC PANEL  ETHANOL    EKG  EKG Interpretation None       Radiology No results found.  Procedures Procedures (including critical care time)  Medications Ordered in ED Medications - No data to display   Initial Impression / Assessment and Plan / ED Course  I have reviewed the triage vital signs and the nursing notes.  Pertinent labs & imaging results that were available during my care of the patient were reviewed by me and considered in my medical decision making (see chart for details).  Clinical Course    Will d/c.  Pt doesn't want inpatient care, and she has no si/hi and is not psychotic.  Final Clinical Impressions(s) / ED Diagnoses   Final diagnoses:  Hallucinations  Polysubstance abuse    New Prescriptions New Prescriptions   No medications on file     Varney Biles, MD 12/31/15 1622

## 2016-01-02 ENCOUNTER — Ambulatory Visit (HOSPITAL_COMMUNITY): Payer: 59 | Admitting: Psychology

## 2016-01-02 DIAGNOSIS — F1420 Cocaine dependence, uncomplicated: Secondary | ICD-10-CM

## 2016-01-02 DIAGNOSIS — F1020 Alcohol dependence, uncomplicated: Secondary | ICD-10-CM | POA: Insufficient documentation

## 2016-01-02 DIAGNOSIS — F32A Depression, unspecified: Secondary | ICD-10-CM

## 2016-01-02 DIAGNOSIS — F1091 Alcohol use, unspecified, in remission: Secondary | ICD-10-CM | POA: Insufficient documentation

## 2016-01-02 DIAGNOSIS — F329 Major depressive disorder, single episode, unspecified: Secondary | ICD-10-CM

## 2016-01-03 ENCOUNTER — Other Ambulatory Visit (HOSPITAL_COMMUNITY): Payer: Self-pay | Attending: Medical | Admitting: Psychology

## 2016-01-03 DIAGNOSIS — F17210 Nicotine dependence, cigarettes, uncomplicated: Secondary | ICD-10-CM | POA: Insufficient documentation

## 2016-01-03 DIAGNOSIS — F1420 Cocaine dependence, uncomplicated: Secondary | ICD-10-CM | POA: Insufficient documentation

## 2016-01-03 DIAGNOSIS — F1020 Alcohol dependence, uncomplicated: Secondary | ICD-10-CM | POA: Insufficient documentation

## 2016-01-03 DIAGNOSIS — Z853 Personal history of malignant neoplasm of breast: Secondary | ICD-10-CM | POA: Insufficient documentation

## 2016-01-03 DIAGNOSIS — F329 Major depressive disorder, single episode, unspecified: Secondary | ICD-10-CM | POA: Insufficient documentation

## 2016-01-03 DIAGNOSIS — F32A Depression, unspecified: Secondary | ICD-10-CM

## 2016-01-04 ENCOUNTER — Other Ambulatory Visit (HOSPITAL_COMMUNITY): Payer: Self-pay | Admitting: Psychology

## 2016-01-04 ENCOUNTER — Other Ambulatory Visit (HOSPITAL_COMMUNITY): Payer: Self-pay | Admitting: Medical

## 2016-01-04 DIAGNOSIS — F32A Depression, unspecified: Secondary | ICD-10-CM

## 2016-01-04 DIAGNOSIS — F329 Major depressive disorder, single episode, unspecified: Secondary | ICD-10-CM

## 2016-01-04 DIAGNOSIS — F1020 Alcohol dependence, uncomplicated: Secondary | ICD-10-CM

## 2016-01-04 DIAGNOSIS — F1420 Cocaine dependence, uncomplicated: Secondary | ICD-10-CM

## 2016-01-05 ENCOUNTER — Encounter (HOSPITAL_COMMUNITY): Payer: Self-pay | Admitting: Psychology

## 2016-01-05 NOTE — Progress Notes (Signed)
Orientation to CD-IOP: The patient is a 45 yo, separated, white, female seeking entry into the CD-IOP to address her chemical dependency. The patient currently lives by herself in [place]. The patient recently sought entry into [facility], reporting auditory and visual hallucinations, most likely due to her cocaine use. She was instructed to appear at [facility] ED for medical clearance. The team at [facility] referred her to this program. The patient reports she is drinking one bottle of wine per day. She last drank 3 days ago, on the 19th. She is snorting 3-5 lines of cocaine 3-4 times per week. Her last cocaine use was also on the 19th. The patient smokes cannabis rarely and that will include 1-2 tokes. The patient admitted that her drinking has been problematic for almost 10 years. The cocaine has been an issue for the last year and a half. The patient had 40 days of residential treatment last year at the [facility] in the [place]. The patient admitted she only stayed sober for about a week. In May of this year, the patient was arrested for a DWI (.16). Her case has been continued and her next court date is later this month. The patient has been separated from her husband since April of 2015. They have 2 daughters, ages 59 and 34 yo. They are currently living with their father and he is using the DWI and her alcohol use as reasons that the girls should be with him and not their mother. The patient reported her maternal grandfather was an alcoholic and her mother was an only child. The patient was born in [place], [place] where her father was a professor. When she was 83 yo, the family moved to [place] where her father joined the faculty at [employer]. She attended and graduated from [employer] and then completed her MFA here at [employer] in 1992. 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 Most recently, she was employed with the same company for 3 years, but lost her  job a year ago because of her drinking and drug use. 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 She has had 5 books published. The patient is a breast cancer survivor. She was first diagnosed in early 2013, underwent surgery, chemotherapy and radiation. She currently is diagnosed with depression and is taking Effexor and Trazadone. The patient presented as very forthcoming and serious about addressing these struggles. The documentation was reviewed, signed and the orientation completed accordingly. The patient will return tomorrow at begin the CD-IOP.

## 2016-01-08 ENCOUNTER — Other Ambulatory Visit (HOSPITAL_COMMUNITY): Payer: Self-pay | Admitting: Psychology

## 2016-01-08 ENCOUNTER — Other Ambulatory Visit (HOSPITAL_COMMUNITY): Payer: Self-pay | Admitting: Medical

## 2016-01-08 VITALS — BP 122/76 | HR 85 | Ht 67.0 in | Wt 170.0 lb

## 2016-01-08 DIAGNOSIS — F1491 Cocaine use, unspecified, in remission: Secondary | ICD-10-CM | POA: Insufficient documentation

## 2016-01-08 DIAGNOSIS — F1420 Cocaine dependence, uncomplicated: Secondary | ICD-10-CM

## 2016-01-08 DIAGNOSIS — F19951 Other psychoactive substance use, unspecified with psychoactive substance-induced psychotic disorder with hallucinations: Secondary | ICD-10-CM | POA: Insufficient documentation

## 2016-01-08 DIAGNOSIS — C50411 Malignant neoplasm of upper-outer quadrant of right female breast: Secondary | ICD-10-CM

## 2016-01-08 DIAGNOSIS — F329 Major depressive disorder, single episode, unspecified: Secondary | ICD-10-CM

## 2016-01-08 DIAGNOSIS — F1994 Other psychoactive substance use, unspecified with psychoactive substance-induced mood disorder: Secondary | ICD-10-CM | POA: Insufficient documentation

## 2016-01-08 DIAGNOSIS — F39 Unspecified mood [affective] disorder: Secondary | ICD-10-CM

## 2016-01-08 DIAGNOSIS — F1020 Alcohol dependence, uncomplicated: Secondary | ICD-10-CM

## 2016-01-08 DIAGNOSIS — F32A Depression, unspecified: Secondary | ICD-10-CM

## 2016-01-08 MED ORDER — VENLAFAXINE HCL ER 75 MG PO CP24: 75.0000 mg | ORAL_CAPSULE | Freq: Every day | ORAL | 2 refills | Status: DC

## 2016-01-08 MED ORDER — NALTREXONE HCL 50 MG PO TABS: 50.0000 mg | ORAL_TABLET | Freq: Every day | ORAL | 2 refills | Status: DC

## 2016-01-08 NOTE — Progress Notes (Signed)
Daily Group Progress Note  Program: CD-IOP   01/08/2016 Jocelyn Lamer [id number]  Diagnosis:  Alcohol use disorder, severe, dependence ([place])  Cocaine use disorder, severe, dependence ([place])  Depression   Sobriety Date: 12/31/15   Group Time: 1-2:30  Participation Level: Active  Behavioral Response: Appropriate and Sharing  Type of Therapy: Process Group  Interventions: Motivational Interviewing and Solution Focused  Topic: Counselors met with patients for group process session. Some members met with the program director to discuss medication management. A new member introduced themselves to the group. All members were actively engaged in the group discussion concerning recovery from mind-altering drugs and alcohol.    Group Time: 2:45-4  Participation Level: Active  Behavioral Response: Appropriate and Sharing  Type of Therapy: Process Group  Interventions: Motivational Interviewing and Solution Focused  Topic: Counselors met with patients for group psychoeducation regarding communication strategies. A successful graduate of the program with over six months of sobriety returned to speak to the group. The counselors asked the graduate questions regarding aftercare and then opened the floor to questions from the group. Most members were active and engaged in the discussion. A drug test was collected from one member.    Summary: Patient presented as well-groomed, active, and engaged in group. The introduced herself to the group and gave a brief overview of her personal history of cocaine and alcohol use. She reports experiencing chronic pain from a foot injury she sustained several years ago. She has experienced success and failure, and reported using drugs and alcohol to "take the pain away" both emotionally and physically. Her use has caused her to lose her job and has caused family stress, particularly with her daughters. The pt is currently separated from her  husband and reports planning to file for divorce. She also has a DWI from this May that she is trying to resolve. Throughout the session the client was positive and hopeful regarding her sobriety.    UDS collected: No Results: Pending  AA/NA attended?: YesWednesday  Sponsor?: Yes   [name], LCAS 01/08/2016 9:32 AM

## 2016-01-08 NOTE — Progress Notes (Signed)
Psychiatric Initial Adult Assessment   Mills Identification: Kelly Mills MRN:  [id number] Date of Evaluation:  01/08/2016 Referral Source: Friend;WLED Chief Complaint:   Visit Diagnosis:    ICD-9-CM ICD-10-CM   1. Alcohol use disorder, severe, dependence ([place]) 303.90 F10.20   2. Cocaine use disorder, severe, dependence ([place]) 304.20 F14.20   3. Depression 311 F32.9   4. Substance induced mood disorder (HCC) 292.84 F19.94   5. Hallucination, drug-induced ([place]) 292.12 F19.951    Subjective: "I need to stop (using cocaine and alcohol)"  History of Present Illness:  45 yo WF seen for Initial Assessment on entrance into [facility] CD IOP Program.Pt was referred from ED after presenting to [facility] escorted by concerned friends: Kelly Mills, Counselor  Psychiatry   Hide copied text Assessment Note   Kelly Mills is a 45 y.o. female who presented voluntarily to [facility] as a walk-in.  She was referred by friends and also by her addictions counselor, Kelly Mills.  Pt reported as follows:  She stated that over Kelly last year, she has experience significant psycho-social stressors, namely separation from her husband involving Kelly loss of custody of her two children, a job loss, and a DWI.  Pt described herself as an alcoholic, and she stated that she volunteered for inpatient treatment for alcoholism at [facility] in October 2016.  Pt's current complaint is that she experienced auditory and visual hallucinations last week.  Pt stated that she has not been sleeping well, and that over Kelly past week, she used $40 worth of cocaine.  She stated that she left her townhome while it was being treated for mice infestation, and that while she was staying at another home, she experienced Kelly sounds of mice, and while she was in her car, she experienced Kelly visual hallucination of a house.  Pt conceded that she was under Kelly influence of cocaine when this happened.  Disposition:   Disposition Initial Assessment Completed for this Encounter: Yes Disposition of Mills: Inpatient treatment program Type of inpatient treatment program: Adult Kelly Levins, NP, recommends inpatient treatment)   Pt was sent to ED for Medical Clearance but elected not to stay Clinical Course   Will d/c. Pt doesn't want inpatient care, and she has no si/hi and is not psychotic Friends contacted [place] Counselor for [facility] CD IOP program and arrangements were made for pt to meet with her on 01/02/2016: Kelly Mills, LCAS  Psychology    [] Hide copied text Orientation to CD-IOP: Kelly Mills is a 45 yo, separated, white, female seeking entry into Kelly CD-IOP to address her chemical dependency. Kelly Mills currently lives by herself in [place]. Kelly Mills recently sought entry into [facility], reporting auditory and visual hallucinations, most likely due to her cocaine use. She was instructed to appear at [facility] ED for medical clearance. Kelly team at [facility] referred her to this program. Kelly Mills reports she is drinking one bottle of wine per day. She last drank 3 days ago, on Kelly 19th. She is snorting 3-5 lines of cocaine 3-4 times per week. Her last cocaine use was also on Kelly 19th. Kelly Mills smokes cannabis rarely and that will include 1-2 tokes. Kelly Mills admitted that her drinking has been problematic for almost 10 years. Kelly cocaine has been an issue for Kelly last year and a half. Kelly Mills had 40 days of residential treatment last year at [facility] [place]. Kelly Mills  admitted she only stayed sober for about a week. In May of this year, Kelly Mills was arrested for a DWI (.16). Her case has been continued and her next court date is later this month. Kelly Mills has been separated from her husband since April of 2015. They have 2 daughters, ages 71 and 22 yo. They are currently living with their father and he is using Kelly DWI and her alcohol use as reasons  that Kelly girls should be with him and not their mother. Kelly Mills reported her maternal grandfather was an alcoholic and her mother was an only child. Kelly Mills was born in [place], [place] where her father was a professor. When she was 50 yo, Kelly family moved to [place] where her father joined Kelly faculty at [employer]. She attended and graduated from [employer] and then completed her MFA here at [employer] in 1992. 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 Most recently, she was employed with Kelly same company for 3 years, but lost her job a year ago because of her drinking and drug use. 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 Kelly Mills is a breast cancer survivor. She was first diagnosed in early 2013, underwent surgery, chemotherapy and radiation. She currently is diagnosed with depression and is taking Effexor and Trazadone. Kelly Mills presented as very forthcoming and serious about addressing these struggles. Kelly documentation was reviewed, signed and Kelly orientation completed accordingly. Kelly Mills will return tomorrow at begin Kelly CD-IOP.    In speaking with her today she is verbose and overly detailed in her answers to questions. She is able to to give excessive amounts of detail about her addiction,treatment and current situation without emotion but as if she were an observer rather than Kelly participant in her story.Kelly details are as noted by Counselor Kelly Mills.  :       Associated Signs/Symptoms: DAST 10 Score 8/10 DSM 5 SUD Criteria 7/11+ for cocaine 10/11+ for alcohol  Description: Female DOB: [birth year] Provider: Chauncey Cruel, MD Department: [facility]  Diagnoses    Codes Comments  Breast cancer of upper-outer quadrant of right female breast [facility])  - Primary ICD-9-CM: 174.4 ICD-10-CM: C50.411         Referring Provider  Carlos Levering, PA-C       Reason for Visit  Reason for Visit History     Progress Notes by Chauncey Cruel, MD at 07/19/2015 7:42 PM  Author: Chauncey Cruel, MD Author Type: Physician Filed: 07/19/2015  7:42 PM  Note Status: Signed Cosign: Cosign Not Required Encounter Date: 07/18/2015  Editor: Chauncey Cruel, MD (Physician)    no show        Depression Symptoms:   depressed mood, anhedonia, psychomotor retardation, feelings of worthlessness/guilt, difficulty concentrating, loss of energy/fatigue, disturbed sleep, decreased appetite,  PHQ 9 Score 22-level1 on self harm Extremely difficult to function  (Hypo) Manic Symptoms:   Delusions, Distractibility, Hallucinations,attributed to heavy cocaine use last week-denies prior history Impulsivity, Irritable Mood, Anxiety Symptoms:   Specific Phobias snakes GAD 7 Score 6 mild anxiety Psychotic Symptoms:  Hallucinations: Visual cocaine related PTSD Symptoms: Had a traumatic exposure:  Current divorce;raised by mother who was raised in alcoholic family/alcholic father Had a traumatic exposure in Kelly last month:  Current divorce Re-experiencing:  hold herself to impossible standards  of perfection;trouble acepting her addictive illness;mistakes she mae's Hypervigilance:  Yes Hyperarousal:  Difficulty Concentrating Emotional Numbness/Detachment Sleep Avoidance:  Denial system   Past Psychiatric History:  Prior Inpatient Therapy Prior Inpatient Therapy: Yes Prior Therapy Dates: October 2017 Prior Therapy Facilty/Provider(s): [facility] Reason for Treatment: Alcohol use   Prior Outpatient Therapy Prior Outpatient Therapy: Yes Prior Therapy Dates: Ongoing Prior Therapy Facilty/Provider(s): Kelly Mills Reason for Treatment: Alcohol use Does Mills have an ACCT team?: No Does Mills have Intensive In-House Services?  : No Does Mills have [facility]arch services? : No Does Mills have P4CC services?: No    Previous  Psychotropic Medications: Yes Effexor Substance Abuse History in Kelly last 12 months:  Yes.   Substance Abuse History in Kelly last 12 months: Substance Age of 1st Use Last Use Amount Specific Type  Nicotine 13 today  Cigarettes  Alcohol 13 12/20/15 1 bottle wine  Cannabis 14 12/13/15 2 joints Pot  Opiates      Cocaine      Methamphetamines      LSD      Ecstasy      Benzodiazepines      Caffeine      Inhalants      Others:                          Consequences of Substance Abuse: Medical Consequences:  Detox/Missed Oncology FU Legal Consequences:  DUI court pending Family Consequences:  Divorce/older daughter aware -relationship "tanking" Blackouts:  yes-x1" DT's: NA Withdrawal Symptoms:   Headaches Nausea Tremors insomnia  Past Medical History:  Past Medical History:  Diagnosis Date  . Breast cancer ([place])    right  . ETOH abuse   . History of chemotherapy    completed 11/13/11  . History of radiation therapy 01/06/12-02/25/12   right breast,5040 cgy/28 sessions, boost 1440 cGy/8 sessions  . Insomnia     Past Surgical History:  Procedure Laterality Date  . AXILLARY LYMPH NODE DISSECTION  12/04/2011   Procedure: AXILLARY LYMPH NODE DISSECTION;  Surgeon: Rolm Bookbinder, MD;  Location: [facility];  Service: General;  Laterality: Right;  . BREAST LUMPECTOMY  12/04/11   right, ER/PR +, HER 2 -  . NASAL SEPTUM SURGERY    . PORT-A-CATH REMOVAL  12/04/2011   Procedure: REMOVAL PORT-A-CATH;  Surgeon: Rolm Bookbinder, MD;  Location: [facility];  Service: General;  Laterality: Left;  . PORTACATH PLACEMENT  07/22/2011   Procedure: INSERTION PORT-A-CATH;  Surgeon: Rolm Bookbinder, MD;  Location: [facility] OR;  Service: General;  Laterality: N/A;  Insertion of Port-A-Cath   Developmental History:Noncontributory-all WNL Prenatal History:  Birth History:  Postnatal Infancy:  Developmental History:  Milestones:  Sit-Up:   Crawl:   Walk:   Speech:   School History: Masters Legal History:Pending DUI Hobbies/Interests:Writer of fiction  Family Psychiatric History: MGM alcoholic;Daughtyer with ADHD and Mood DO  Family History:  Family History  Problem Relation Age of Onset  . Cancer Maternal Grandfather     melanoma  . Diabetes Paternal Grandfather   . Arthritis Mother   . Hypertension Father   . Psoriasis Brother   . Anesthesia problems Neg Hx     Social History:   Social History   Social History  . Marital status: Married    Spouse name: N/A  . Number of children: N/A  . Years of education: N/A   Social History Main Topics  . Smoking status: Current Some Day Smoker  .  Smokeless tobacco: Never Used  . Alcohol use Yes  . Drug use:     Frequency: 4.0 times per week    Types: Cocaine     Comment: Weekly use  . Sexual activity: Yes    Birth control/ protection: IUD     Comment: menarche age 37, 68st pregnancy age 39   Other Topics Concern  .  Hematology and Oncology  Expand All Collapse All   [place]  [phone] [phone]  OFFICE PROGRESS NOTE  ID: Kelly Mills   DOB: 1970-06-18  MR#: [id number]  EUM#:[id number] PCP: Wynelle Fanny SU: Rolm Bookbinder, MD OTHER MD: Arloa Koh, MD   CHIEF COMPLAINT: left breast cancer CURRENT TREATMENT: tamoxifen daily BREAST CANCER HISTORY: From Dr. Collier Salina Rubin's new Mills evaluation dated 07/17/2011: "Kelly Mills has been in good health. She has had previous mammograms. Last mammogram was in May 2012. She self detected a mass in her left breast in January of this year. She sought medical attention for this and was referred for mammogram and ultrasound. This took place on 07/03/2011. At that time a firm palpable mass was noted at 10:00 position. This measured 1.8 x 1.7 x 1.6 cm. Prominent lymph node was seen in Kelly axilla. Biopsies of both areas were was performed. Both Kelly breast and lymph node showed invasive mammary  carcinoma he ER status was 98% PR +80%, proliferative index 84% HER-2 was negative with a ratio 0.96 MRI scan of both breasts performed 07/13/2011 showed a mass measuring 3.3 x 2.3 x 2 cm. In addition there was an enhancing nodule located 8 mm posterior inferior to Kelly larger mass measuring 1.1 x 0.8 x 0.9 cm. In aggregate this total area measured 4.2 cm."  Her subsequent history is as detailed below.      INTERVAL HISTORY: Jenniferlynn returns today for follow up of her breast cancer. She has been on tamoxifen since October 2013 and tolerates this well. She is on 60m venlafaxine daily (despite Kelly prescription reading 1539m and this controls her hot flashes well. She denies vaginal changes. Kelly interval history is remarkable for her official divorce from her husband in Kelly spring. She had sought therapy throughout this trial in her life, but finally realized that she often drank to "self medicate." Though she tells me she does not drink at work, first thing in Kelly morning, or to Kelly point where she passes out, she realized that she was using alcohol inappropriate and as a drug. She plans to admit herself into a 6 week rehabilitation program some time this Fall.       Missed FU March 2017   Social History Narrative  . Recent stressful life event(s): Divorce, Financial Problems, Other (Comment), Job Loss (Separated; in recovery from alcohol; financial stress) Persecutory voices/beliefs?: No Depression: Yes Depression Symptoms: Despondent, Insomnia, Tearfulness, Isolating, Loss of interest in usual pleasures Substance abuse history and/or treatment for substance abuse?: Yes (Cocaine use; alcohol use) Suicide prevention information given to non-admitted patients: Not applicable       Additional Social History:   Allergies:   Allergies  Allergen Reactions  . Codeine Nausea Only    Metabolic Disorder Labs: Results for DANYASHIA, RANEYMRN [id number] of 01/10/2016 13:45  Ref. Range  01/25/2015 08:41 12/31/2015 16:00  Glucose Latest Ref Range: 65 - 99 mg/dL 110 93   No results found for: PROLACTIN No results found for: CHOL, TRIG, HDL, CHOLHDL, VLDL, LDLCALC   Current Medications: Current Outpatient Prescriptions  Medication Sig Dispense Refill  . ibuprofen (ADVIL,MOTRIN) 200 MG tablet Take 200 mg by mouth every 6 (six) hours as needed.    . Multiple Vitamin (MULTIVITAMIN) capsule Take 1 capsule by mouth daily.    . potassium chloride (MICRO-K) 10 MEQ CR capsule Take 2 capsules (20 mEq total) by mouth 2 (two) times daily. 12 capsule 0  . tamoxifen (NOLVADEX) 20 MG tablet Take 1 tablet (20 mg total) by mouth daily. 90 tablet 2  . traZODone (DESYREL) 100 MG tablet Take 1 tablet (100 mg total) by mouth at bedtime as needed. (Mills not taking: Reported on 01/25/2015) 30 tablet 12  . venlafaxine XR (EFFEXOR-XR) 150 MG 24 hr capsule Take 1 capsule (150 mg total) by mouth daily with breakfast. (Mills taking differently: Take 150 mg by mouth daily with breakfast. Pt takes 75 mg daily.) 30 capsule 4  . zolpidem (AMBIEN) 5 MG tablet Take 1 tablet (5 mg total) by mouth at bedtime as needed. 30 tablet 0   No current facility-administered medications for this visit.     Neurologic: Headache: Negative Seizure: Negative Paresthesias:Yes Toes from Chemo  Musculoskeletal: Strength & Muscle Tone: within normal limits Gait & Station: normal Mills leans: N/A  Psychiatric Specialty Exam: Review of Systems  Constitutional: Negative for chills, diaphoresis, fever, malaise/fatigue and weight loss.  HENT: Negative for congestion, ear discharge, ear pain, hearing loss, nosebleeds, sore throat and tinnitus.   Eyes: Negative for blurred vision, double vision, photophobia, pain, discharge and redness.       Presbyopia  Respiratory: Negative for cough, hemoptysis, sputum production, shortness of breath, wheezing and stridor.   Cardiovascular: Negative for chest pain, palpitations,  orthopnea, claudication, leg swelling and PND.  Gastrointestinal: Negative for abdominal pain, blood in stool, constipation, diarrhea, heartburn, melena, nausea and vomiting.  Genitourinary: Negative for dysuria, flank pain, frequency, hematuria and urgency.  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 Negative for falls, myalgias and neck pain.  Skin: Negative for itching and rash.  Neurological: Positive for tingling (PN feet s/p chemo). Negative for dizziness, tremors, sensory change, speech change, focal weakness, seizures, loss of consciousness, weakness and headaches.  Endo/Heme/Allergies: Negative for environmental allergies and polydipsia. Bruises/bleeds easily (bruise).  Psychiatric/Behavioral: Positive for depression, hallucinations, memory loss (substance related) and substance abuse. Negative for suicidal ideas. Kelly Mills is nervous/anxious and has insomnia (intermittent PRN trazodone ).     There were no vitals taken for this visit.There is no height or weight on file to calculate BMI.  General Appearance: Well Groomed  Eye Contact:  Fair  Speech:  Pressured  Volume:  Normal  Mood:  Dysphoric  Affect:  Congruent  Thought Process:  Goal Directed, Linear and Descriptions of Associations: Loose  Orientation:  Full (Time, Place, and Person)  Thought Content:  Delusions and Rumination  Suicidal Thoughts:  No  Homicidal Thoughts:  No  Memory:  Negative  Judgement:  Poor  Insight:  Lacking  Psychomotor Activity:  Normal  Concentration:  Concentration: Fair and Attention Span: Fair  Recall:  [employer] of Knowledge:Good  Language: Good  Akathisia:  NA  Handed:  Right  AIMS (if indicated):  NA  Assets:  Desire for Improvement Housing Resilience Social Support Transportation Vocational/Educational  ADL's:  Intact  Cognition: Impaired,  Moderate  Sleep:  Using Trazodone     Treatment Plan Summary: Treatment Plan/Recommendations:  Plan of Care: [facility] CD IOP ([facility]  Model)  Laboratory:  UDS per protocol  Psychotherapy: Individual;group and family CD IOP  Medications: See List Trial of Naltrexone RX today considering Campral and adding to antidepressant/mood disorder regimine  Routine PRN Medications:  Negative  Consultations: None now-needs FU with Oncologist  Safety Concerns:  RELAPSE  Other:  NA     Darlyne Russian, PA-C 8/28/20173:51 PM

## 2016-01-09 ENCOUNTER — Telehealth (HOSPITAL_COMMUNITY): Payer: Self-pay | Admitting: Psychology

## 2016-01-09 LAB — PRESCRIPTION ABUSE MONITORING 17P, URINE
6-Acetylmorphine, Urine: NEGATIVE ng/mL
Amphetamine Scrn, Ur: NEGATIVE ng/mL
BARBITURATE SCREEN URINE: NEGATIVE ng/mL
BENZODIAZEPINE SCREEN, URINE: NEGATIVE ng/mL
BUPRENORPHINE, URINE: NEGATIVE ng/mL
CANNABINOIDS UR QL SCN: NEGATIVE ng/mL
CARISOPRODOL/MEPROBAMATE, UR: NEGATIVE ng/mL
CREATININE(CRT), U: 36.8 mg/dL (ref 20.0–300.0)
Cocaine (Metab) Scrn, Ur: NEGATIVE ng/mL
EDDP, Urine: NEGATIVE ng/mL
FENTANYL, URINE: NEGATIVE pg/mL
MDMA Screen, Urine: NEGATIVE ng/mL
MEPERIDINE SCREEN, URINE: NEGATIVE ng/mL
Methadone Screen, Urine: NEGATIVE ng/mL
Nitrite Urine, Quantitative: NEGATIVE ug/mL
OXYCODONE+OXYMORPHONE UR QL SCN: NEGATIVE ng/mL
Opiate Scrn, Ur: NEGATIVE ng/mL
PHENCYCLIDINE QUANTITATIVE URINE: NEGATIVE ng/mL
Ph of Urine: 5.7 (ref 4.5–8.9)
Propoxyphene Scrn, Ur: NEGATIVE ng/mL
SPECIFIC GRAVITY: 1.009
Tapentadol, Urine: NEGATIVE ng/mL

## 2016-01-09 LAB — TRAMADOL (GC/MS), URINE
TRAMADOL (GC/MS): 2395 ng/mL
TRAMADOL: POSITIVE — AB

## 2016-01-09 LAB — ETHYL GLUCURONIDE, URINE: Ethyl Glucuronide Screen, Ur: NEGATIVE ng/mL

## 2016-01-09 NOTE — Progress Notes (Signed)
    Daily Group Progress Note  Program: CD-IOP   01/09/2016 Kelly Mills [id number]  Diagnosis:  Alcohol use disorder, severe, dependence ([place])  Cocaine use disorder, severe, dependence ([place])  Depression   Sobriety Date: 01/04/16  Group Time: 1-2:30  Participation Level: Active  Behavioral Response: Appropriate and Sharing  Type of Therapy: Process Group  Interventions: Motivational Interviewing and Solution Focused  Topic: Counselors met with patients for 1.5 hour group process session. Patients were active and engaged. Pt discussed their challenges related to recovery, sobriety, and building social support.      Group Time: 2:45-4  Participation Level: Active  Behavioral Response: Appropriate and Sharing  Type of Therapy: Psycho-education Group  Interventions: Motivational Interviewing and Solution Focused  Topic: Counselors met with patients for 1.5 hour group psychoeducation session. Patients were active and engaged. Counselors led topical discussion on "communication skills in recovery" utilizing resources and handouts from Seeking Safety, and Mindfulness Based Relapse Prevention Workbook. Patients were active and engaged in session and talked openly about their struggle to communicate with friends and family members.   Summary: Patient presented as highly active and engaged in session. She reported that she attended 1 AA meeting. Pt reported disclosing her alcohol and cocaine use hx with her sponsor and feels that "she is making progress". She described her current mood as "hopeful". Patient shared briefly about an accident that left her foot injured and keeps her in chronic low level pain. Patient is presenting well and contributes strongly to the group in both process and psychoeducation. Patient stated she is taking her medications as px and is seeing positive results. She did not report any significant cravings for alcohol or cocaine. Kelly Mills,  Counselor   UDS collected: No Results:   AA/NA attended?: YesThursday  Sponsor?: Yes   Kelly Mills, [place] 01/09/2016 4:04 PM

## 2016-01-09 NOTE — Progress Notes (Signed)
    Daily Group Progress Note  Program: CD-IOP   01/09/2016 Jocelyn Lamer [id number]  Diagnosis:  Alcohol use disorder, severe, dependence ([place])  Cocaine use disorder, severe, dependence ([place])  Depression  Substance induced mood disorder ([place])  Hallucination, drug-induced ([place])   Sobriety Date: 12/31/15  Group Time: 1-2:30  Participation Level: Active  Behavioral Response: Appropriate and Sharing  Type of Therapy: Process Group  Interventions: CBT, Motivational Interviewing and Solution Focused  Topic: Counselors met with patients for 1.5 hour group process session. Patients were active and engaged. Pt discussed their challenges related to recovery, sobriety, and building social support.      Group Time: 2:45-4  Participation Level: Active  Behavioral Response: Appropriate and Sharing  Type of Therapy: Psycho-education Group  Interventions: CBT, Motivational Interviewing, Solution Focused and Psychosocial Skills: Communication Skills  Topic: Counselors met with patients for 1.5 hour group psychoeducation session. Patients were active and engaged. Counselors led topical discussion on "communication skills in recovery" utilizing resources and handouts from Seeking Safety, and Mindfulness Based Relapse Prevention Workbook. Patients were active and engaged in session and talked openly about their struggle to communicate with friends and family members.   Summary: Patient presented as active and engaged in group. She reported attending 4 AA meetings over the weekend. She presented as excited and upbeat in group. She stated that she "annoyed her sponsor" over the weekend since she did not attend a meeting as scheduled. Pt reported that she recently received her furniture from her old house and stayed up till 2am cleaning and organizing her house. Pt reported that she is enjoying going to [employer]' meetings and meeting other females in recovery. Patient shared  extensively about her daughters verbal and emotional outbursts which have caused significant strain on her marriage and her view of herself as a mother. Patient identified as "deeply sensitive" and taking "everything to heart".  [name], Counselor   UDS collected: Yes Results: Pending  AA/NA attended?: YesFriday, Saturday and Sunday  Sponsor?: Yes   [name], LCAS 01/09/2016 4:49 PM

## 2016-01-10 ENCOUNTER — Encounter (HOSPITAL_COMMUNITY): Payer: Self-pay | Admitting: Medical

## 2016-01-10 ENCOUNTER — Other Ambulatory Visit (HOSPITAL_COMMUNITY): Payer: Self-pay | Admitting: Psychology

## 2016-01-10 DIAGNOSIS — F39 Unspecified mood [affective] disorder: Secondary | ICD-10-CM

## 2016-01-10 DIAGNOSIS — F1420 Cocaine dependence, uncomplicated: Secondary | ICD-10-CM

## 2016-01-10 DIAGNOSIS — F32A Depression, unspecified: Secondary | ICD-10-CM

## 2016-01-10 DIAGNOSIS — F329 Major depressive disorder, single episode, unspecified: Secondary | ICD-10-CM

## 2016-01-10 DIAGNOSIS — Z6372 Alcoholism and drug addiction in family: Secondary | ICD-10-CM | POA: Insufficient documentation

## 2016-01-10 DIAGNOSIS — F1994 Other psychoactive substance use, unspecified with psychoactive substance-induced mood disorder: Secondary | ICD-10-CM

## 2016-01-10 DIAGNOSIS — F1020 Alcohol dependence, uncomplicated: Secondary | ICD-10-CM

## 2016-01-10 MED ORDER — ARIPIPRAZOLE 10 MG PO TABS: 10.0000 mg | ORAL_TABLET | Freq: Every day | ORAL | 2 refills | Status: DC

## 2016-01-10 MED ORDER — BACLOFEN 20 MG PO TABS: ORAL_TABLET | ORAL | 2 refills | Status: DC

## 2016-01-10 MED ORDER — TAMOXIFEN CITRATE 20 MG PO TABS: 20.0000 mg | ORAL_TABLET | Freq: Every day | ORAL | 2 refills | Status: DC

## 2016-01-10 NOTE — Progress Notes (Signed)
Patient ID: Kelly Mills, female   DOB: 19-Apr-1971, 45 y.o.   MRN: [id number] S-Pt seen S/P intake 2 days ago to discuss medications due to c/o of lack of efficacy from [facility] for mood and thought disturbances. At team meeting it was revealed pt had used Cocaine again Monday andf Tuesday-pt reports this is true .Cites triggers of emotional vulnerability due to stressors of divorce;struggles of younger daughter with school (ADHD) and mood disorder with c/o SI.and recent move (Pt c/o mice and rats in new condo and hired exterminator failed to resolve.She had taken photos of flour sprinkled on floor behind refrigerator [id number] to show footprints ts of rats but her friends reported to [place] she was obsessed and that photos "did not look like that") Use Monday was discovered leftover cocaine while moving Use Tuesday was triggered by phone call from recognizede dealer/friend she had r erased but not blocked.Also she had not informed him she was in recovery.Associated with the call was the realization "I have $30 cash in my wallet"..Initially pt had stressed her alcohol use;minimized cocaine use and was prescribed Naltrexone on Monday.She made the side comment she was "surprised taht someone already so hyper would be attracted to cocaine".  Pt also admitted ,at times tearfully ,that hearing she might be bipolar was ablow to her-associating it with messages of inferiority/unfitnesss and stereotyping herself.She had the same visceral reaction to her diagnosis of Breast cancer and Alcoholism. She recognized [employer] as a book she needs to read.SHE ADMITTED SHE SUSPECTED SHE WAS BIPOLAR AND THAT THERE WAS ALSO A SENSE OF RELIEF IT BEING OUT IN THE OPEN NOW.   O-Mental Status Examination   Appearance: Casually dressed/slightly dissheveled Alert: Yes Attention: Good  Cooperative: Yes Eye Contact: Fair Speech: normal in volume, rapid rate, tone varied,  spontaneous Psychomotor Activity: Normal Memory/Concentration: OK Oriented: person, place and situation Mood: Wide ranging dysphoric;tearful;angry;euthymic Affect: Congruent and Full Range Thought Processes and Associations: Obsessive;rambling with tangential episodes;Emotional Fund of Knowledge: Fair Thought Content:NO Suicidal ideation, Homicidal ideation, Auditory hallucinations, Visual hallucinations, Delusions and Paranoia Insight: Fair to poor Judgement: Fair to poor Language: Fair  A-1  LOW SELF ESTEEM/GRANDCHILD OF AN ALCOHOLIC Her maternal GF was alcoholic and mother the adult Child of an Alcoholic transmitting the dysfunctional family dynamic /characteristics of alcoholic families ONE BEING UNREALISTIC EXPECTATIONS AND [place] ARE NOT MET BY THE CHILD     2  MOOD DISORDER-BIPOLAR VS SUBSTANCE INDUCED VS BOTH  P-D/C NALTREXONE AND START BACLOFEN-UTUBE VIDEO REVIEWED ALONG WITH RISKS AND BENEFITS    ADD ABILIFY TO [facility]    READ PERFECT DAUGHTERS    FU 1 WEEK    CONTINUE CDIOP

## 2016-01-11 ENCOUNTER — Other Ambulatory Visit (HOSPITAL_COMMUNITY): Payer: Self-pay | Admitting: Psychology

## 2016-01-11 DIAGNOSIS — F1020 Alcohol dependence, uncomplicated: Secondary | ICD-10-CM

## 2016-01-11 DIAGNOSIS — F1420 Cocaine dependence, uncomplicated: Secondary | ICD-10-CM

## 2016-01-12 ENCOUNTER — Telehealth: Payer: Self-pay | Admitting: Oncology

## 2016-01-12 ENCOUNTER — Telehealth (HOSPITAL_COMMUNITY): Payer: Self-pay | Admitting: Psychology

## 2016-01-12 ENCOUNTER — Encounter (HOSPITAL_COMMUNITY): Payer: Self-pay | Admitting: Psychology

## 2016-01-12 MED ORDER — ARIPIPRAZOLE 2 MG PO TABS: ORAL_TABLET | ORAL | 0 refills | Status: DC

## 2016-01-12 NOTE — Addendum Note (Signed)
Addended by: Dara Hoyer on: 01/12/2016 01:52 PM   Modules accepted: Orders

## 2016-01-12 NOTE — Telephone Encounter (Signed)
PATIENT CALLED TO [facility] MISSED APPT. 01/12/16

## 2016-01-12 NOTE — Progress Notes (Signed)
Kelly Mills is a 45 y.o. female patient. CD-IOP: Treatment Planning Session. Met with the patient this morning as scheduled. She reported she was still feeling slightly over-medicated despite not having taken any more Abilify since Wednesday evening. She had been out of sorts and very sedated yesterday in group. I agreed to contact our program director and ask what we might do to help this patient adjust to this new medication. I also noted that the program director had seen where she had been a 'no show' for her last oncology appointment. The patient was aware of this and agreed to call and reschedule something here while in my office. A call was placed and she scheduled to meet with her oncologist on Thursday, October 5th, at 2 pm. The importance of identifying treatment goals was discussed this morning and patient was receptive to discussing her goals of treatment. She identified remaining abstinent as her primary goal and agreed that she would not do this alone. She would need the support of those in the [facility] as well as friends and loved ones. When asked about any other goals, the patient reported she wanted to address and improve her self-esteem. She admitted that over the past few years her self-esteem has been lowered and she hasn't felt good about herself. The patient admitted that a part of her thinks she won't ever find anyone that would love her. She became teary as she shared about this belief. We discussed how we would begin to address this loss of self-esteem and how she will regain a strong and confident sense of self. The patient was more upbeat and energized and seems to have thrown off that cloak of shame that frequently accompanies a relapse. Her plans for the upcoming holiday weekend were discussed at length and she has a number of good supportive friends as well as a sponsor who will be around and holding her accountable. The patient has a busy day today and the stress she is under  as she prepares to finalize the sale of her house as well as address some very time -sensitive financial matters, largely caused by her husband's irresponsibility is troubling, but there is no one else who can do this. She also has plans to visit some friends up in the [place] and they are very validating and supportive. The treatment plan was reviewed, signed and completed. We will continue to follow this patient closely in the days ahead. Her sobriety date is 8/30.        [name], LCAS

## 2016-01-15 LAB — PRESCRIPTION ABUSE MONITORING 17P, URINE
6-Acetylmorphine, Urine: NEGATIVE ng/mL
Amphetamine Scrn, Ur: NEGATIVE ng/mL
BARBITURATE SCREEN URINE: NEGATIVE ng/mL
BENZODIAZEPINE SCREEN, URINE: NEGATIVE ng/mL
BUPRENORPHINE, URINE: NEGATIVE ng/mL
CANNABINOIDS UR QL SCN: NEGATIVE ng/mL
Carisoprodol/Meprobamate, Ur: NEGATIVE ng/mL
Creatinine(Crt), U: 30.6 mg/dL (ref 20.0–300.0)
EDDP, Urine: NEGATIVE ng/mL
FENTANYL, URINE: NEGATIVE pg/mL
MDMA Screen, Urine: NEGATIVE ng/mL
MEPERIDINE SCREEN, URINE: NEGATIVE ng/mL
Methadone Screen, Urine: NEGATIVE ng/mL
Nitrite Urine, Quantitative: NEGATIVE ug/mL
OXYCODONE+OXYMORPHONE UR QL SCN: NEGATIVE ng/mL
Opiate Scrn, Ur: NEGATIVE ng/mL
PH UR, DRUG SCRN: 5.5 (ref 4.5–8.9)
Phencyclidine Qn, Ur: NEGATIVE ng/mL
Propoxyphene Scrn, Ur: NEGATIVE ng/mL
SPECIFIC GRAVITY: 1.01
TAPENTADOL, URINE: NEGATIVE ng/mL
TRAMADOL SCREEN, URINE: NEGATIVE ng/mL

## 2016-01-15 LAB — ETHYL GLUCURONIDE LC/MS/MS
ETG/ETS LC/MS/MS: POSITIVE — AB
ETHYL GLUCURONIDE: POSITIVE — AB
ETHYL SULFATE LC/MS/MS: 245 ng/mL
ETHYL SULFATE: POSITIVE — AB
Ethyl Glucuronide LC/MS/MS: 710 ng/mL

## 2016-01-15 LAB — ETHYL GLUCURONIDE, URINE

## 2016-01-15 LAB — COCAINE (GC/MS), URINE
BENZOYLECGONINE (GC/MS): 1800 ng/mL
COCAINE + METABOLITE: POSITIVE — AB

## 2016-01-15 NOTE — Progress Notes (Signed)
Daily Group Progress Note  Program: CD-IOP   01/15/2016 Jocelyn Lamer [id number]  Diagnosis:  Cocaine use disorder, severe, dependence ([place])  Dysfunctional family due to alcoholism  Alcohol use disorder, severe, dependence ([place])  Episodic mood disorder ([place])  Depression  Substance induced mood disorder ([place])   Sobriety Date: 01/10/16 - today  Group Time: 1-2:30 pm  Participation Level: Active and sharing  Behavioral Response: Appropriate  Type of Therapy: Process Group  Interventions: Solution Focused  Topic: Process: the first half of group was spent in process. Members disclosed the ways they have supported their sobriety since we last met. These could include attending meetings, step work, meetings with sponsor or others in recovery, but also healthy activities, like the gym or walking, that they may have stopped doing in the past. A new group member was present and she introduced herself. She was shy and reserved and admitted she was very nervous. Random drug tests were collected. The program director met with group members during the session, including the new member.  Group Time: 2:30-4 pm  Participation Level: Active  Behavioral Response: Sharing  Type of Therapy: Psycho-education Group  Interventions: Strength-based  Topic: Psycho-Ed: The Pros and Cons of Addiction versus Sobriety.  The second half of group was spent in a psycho-e. Four squares were drawn on the board and members were asked to identify the pros and cons of using versus being clean and sober. The discussion was lively and animated. The new member was much more active and easily identified at least 4 different advantages to sobriety. There were many advantages to sobriety versus active addiction at the end of this session.   Summary: The patient checked-in with a sobriety date of today. After the check-in, she was asked to share about the events that had led up to her 'return to use'. The  patient explained how she had come across a bag with a small amount of powder cocaine in it on Monday morning. She was completing her move to a new house and the sale of the old one and had found it among some items. She had not expected it. Instead of calling or doing something with it, the patient snorted it. She had come to group later in the day and not shared about this use. The patient admitted she had not disclosed this to anyone, including her sponsor. The next day she received a call from an old drug dealer. The patient reported she had been making a lot of calls that day and had not recognized the number when it came up. He had been in her neighborhood and called her. She bought $30 and snorted it. A friend phoned her and after a few minutes of conversation, he had asked her if she was high and she had admitted using. The patient became teary and admitted she is feeling tremendous guilt and shame. All of her friends and sponsor are upset with her and she seems to have lost any of the trust she had recently been rebuilding. The patient admitted she was very disappointed in herself. Other group members offered support and encouraged her to 'not beat yourself up'. The patient reminded the group of the stress she has been feeling with completing the move, trying to get her daughter into Friend's school and her ongoing financial struggles. The patient met with the program director for the remainder of the process session. In the psycho-ed, the patient identified the lowered quality of work that comes with  addiction. She reminded the group that 'I lost my job' as a result of my addiction.  The lost of trust is another 'con' of addiction that the patient could relate to.the pros of sobriety included: "thinking better and problem solving". The patient expressed remorse over the last 2 days, but was encouraged to get back to meetings and 'get back on the horse'. She has lots of support and friends and must trust  them and not hold anything back. The patient is shaken and disappointed after this relapse. It is imperative that re-engage and get back into her recovery mentality.    UDS collected: Yes Results: positive for cocaine and alcohol  AA/NA attended?: No  Sponsor?: Yes   [name], LCAS 01/15/2016 2:07 PM

## 2016-01-16 NOTE — Progress Notes (Signed)
    Daily Group Progress Note  Program: CD-IOP   01/16/2016 Jocelyn Lamer [id number]  Diagnosis:  Cocaine use disorder, severe, dependence ([place])  Alcohol use disorder, severe, dependence ([place])   Sobriety Date: 01/10/16  Group Time: 1-2:30  Participation Level: Active  Behavioral Response: Appropriate and Sharing  Type of Therapy: Process Group  Interventions: Motivational Interviewing and Solution Focused  Topic: Counselors met with patients for group process session. Members shared about their challenges and successes related to their recovery. Members also discussed 12 Step meeting attendance and weekend plans for the upcoming Labor Day holiday. All patients were active and engaged. One new member was present for group.     Group Time: 2:30-4  Participation Level: Active  Behavioral Response: Appropriate and Sharing  Type of Therapy: Psycho-education Group  Interventions: CBT, Motivational Interviewing and Solution Focused  Topic: Counselors met with patients for group psychoeducation session on the topic of "family of origin ([place]) and addiction". Patients did a mindfulness exercise remembering a past family food/ritual in order to stimulate conversation about [place]. Patients completed an "I am from" poem which detailed their childhood and family habits. Patients shared openly about their experience in childhood and how it impacted their addiction and relationships in their adulthood.    Summary: Patient presented as active and engaged in group though she admitted she was feeling drowsy, possibly due to her starting taking '10mg'$  of Abilify this week. Patient reported vomiting in the bathroom during break from smoking a cigarette, but chose to stay in group. Pt st she felt exhausted yesterday and went to bed at 8pm. Patient shared about her [place] and her "good childhood". Patient requested to leave but counselor encouraged her to stay for group if she felt able, which pt  agreed she could. Patient discussed plans for her holiday weekend including 12 step meetings and spending time with friends. [name], Counselor   UDS collected: No Results:   AA/NA attended?: No  Sponsor?: Yes   [name], LCAS 01/16/2016 11:11 AM

## 2016-01-17 ENCOUNTER — Telehealth (HOSPITAL_COMMUNITY): Payer: Self-pay | Admitting: Psychology

## 2016-01-17 ENCOUNTER — Other Ambulatory Visit (HOSPITAL_COMMUNITY): Payer: Self-pay

## 2016-01-18 ENCOUNTER — Other Ambulatory Visit (HOSPITAL_COMMUNITY): Payer: Self-pay | Attending: Medical | Admitting: Psychology

## 2016-01-18 DIAGNOSIS — F1020 Alcohol dependence, uncomplicated: Secondary | ICD-10-CM | POA: Insufficient documentation

## 2016-01-18 DIAGNOSIS — F331 Major depressive disorder, recurrent, moderate: Secondary | ICD-10-CM | POA: Insufficient documentation

## 2016-01-18 DIAGNOSIS — F32A Depression, unspecified: Secondary | ICD-10-CM

## 2016-01-18 DIAGNOSIS — F1420 Cocaine dependence, uncomplicated: Secondary | ICD-10-CM | POA: Insufficient documentation

## 2016-01-18 DIAGNOSIS — F329 Major depressive disorder, single episode, unspecified: Secondary | ICD-10-CM

## 2016-01-18 DIAGNOSIS — F1994 Other psychoactive substance use, unspecified with psychoactive substance-induced mood disorder: Secondary | ICD-10-CM

## 2016-01-19 ENCOUNTER — Telehealth (HOSPITAL_COMMUNITY): Payer: Self-pay | Admitting: Psychology

## 2016-01-19 NOTE — Progress Notes (Signed)
    Daily Group Progress Note  Program: CD-IOP   01/19/2016 Jocelyn Lamer [id number]  Diagnosis:  Cocaine use disorder, severe, dependence ([place])  Alcohol use disorder, severe, dependence ([place])  Substance induced mood disorder ([place])  Depression   Sobriety Date: 01/18/16  Group Time: 1-2:30  Participation Level: Active  Behavioral Response: Appropriate, Sharing, Grandiose, Agitated and Assertive  Type of Therapy: Process Group  Interventions: CBT, Motivational Interviewing, Solution Focused and Reframing  Topic: Counselor met with patients for 1.5 hour group process session. Patients were active and engaged. Topics included open sharing, linking patient's experiences, and encouraging pt in their recovery. Drug test was collected from 1 member. One member self-reported that she had used last night. Counselor spent significant time addressing relapse prevention and processing the relapse with the group. PHQ9 and GAD7 were administered to all patients.   Group Time: 2:30-4  Participation Level: Active  Behavioral Response: Appropriate, Sharing, Grandiose, Agitated and Motivated  Type of Therapy: Psycho-education Group  Interventions: CBT and Other: Mindfulness based relapse prevention  Topic: Counselors met with patients for 1.5 hour group psychoeducation session. Patients were active and engaged. Topics included mindfulness based relapse prevention, the difference between thoughts and feelings, and becoming aware of craving feelings and negative emotions in recovery. Patients did a 10 min guided imagery which taught how to become aware of thoughts instead of being controlled by them.    Summary: Patient presented as active and engaged in group. She listened and participated openly. Pt stated she had missed yesterdays group due to an "informal job prospective interview". She reported that she used powder cocaine and drank "some wine" last night and did not call anyone or  reach out for help. Pt admitted she felt like telling someone but part of her "did not want to admit she needed help because she is tired of always being the problem". Counselor reflected that "what we resist, persists" and that confronting her mental health symptoms and cocaine addiction is the only way to combat it. She agreed and st that she may be "overthinking recovery". Counselor encouraged her to keep it simple and focus on doing what she needed to do to avoid cocaine use. Pt stated she felt like "a little girl sometimes with all the accountability of recovery". Counselor encouraged her to focus on her goals and not her immediate emotions. Patient rt she had started Baclophen and Abilify and was feeling better after adjusting to the new dosages. She rt on her mother who was "very negative" and taught pt "how to catastrophize". Counselor is concerned that pt may not be able to stop using cocaine in an IOP environment. Counselor will monitor closely to see if patient shows proper ability and strength to resist using drugs/alcohol this weekend. [name], LPCA   UDS collected: No Results: Self reported positive for alcohol, cocaine as of 01/18/16 AA/NA attended?: No  Sponsor?: No   [name], LCAS 01/19/2016 1:38 PM

## 2016-01-22 ENCOUNTER — Other Ambulatory Visit (HOSPITAL_COMMUNITY): Payer: Self-pay | Admitting: Medical

## 2016-01-22 ENCOUNTER — Other Ambulatory Visit (HOSPITAL_COMMUNITY): Payer: Self-pay | Admitting: Psychology

## 2016-01-22 DIAGNOSIS — F1020 Alcohol dependence, uncomplicated: Secondary | ICD-10-CM

## 2016-01-22 DIAGNOSIS — F1420 Cocaine dependence, uncomplicated: Secondary | ICD-10-CM

## 2016-01-22 DIAGNOSIS — F39 Unspecified mood [affective] disorder: Secondary | ICD-10-CM

## 2016-01-24 ENCOUNTER — Other Ambulatory Visit (HOSPITAL_COMMUNITY): Payer: Self-pay | Admitting: Psychology

## 2016-01-24 DIAGNOSIS — F1420 Cocaine dependence, uncomplicated: Secondary | ICD-10-CM

## 2016-01-24 DIAGNOSIS — F1994 Other psychoactive substance use, unspecified with psychoactive substance-induced mood disorder: Secondary | ICD-10-CM

## 2016-01-24 DIAGNOSIS — F1020 Alcohol dependence, uncomplicated: Secondary | ICD-10-CM

## 2016-01-25 ENCOUNTER — Other Ambulatory Visit (HOSPITAL_COMMUNITY): Payer: Self-pay | Admitting: Psychology

## 2016-01-25 DIAGNOSIS — F1420 Cocaine dependence, uncomplicated: Secondary | ICD-10-CM

## 2016-01-25 DIAGNOSIS — F19951 Other psychoactive substance use, unspecified with psychoactive substance-induced psychotic disorder with hallucinations: Secondary | ICD-10-CM

## 2016-01-25 DIAGNOSIS — F1020 Alcohol dependence, uncomplicated: Secondary | ICD-10-CM

## 2016-01-25 NOTE — Progress Notes (Signed)
    Daily Group Progress Note  Program: CD-IOP   01/25/2016 Jocelyn Lamer [id number]  Diagnosis:  Cocaine use disorder, severe, dependence ([place])  Alcohol use disorder, severe, dependence ([place])  Substance induced mood disorder ([place])   Sobriety Date: 01/18/16  Group Time: 1-2:30  Participation Level: Active  Behavioral Response: Appropriate, Sharing and tearful  Type of Therapy: Process Group  Interventions: CBT, Motivational Interviewing and Solution Focused  Topic: Patients met with counselors for 1.5 hour group process session. Discussion included immediate experiencing, recovery-related topics, and challenges to members' sobriety. A drug test was collected from one group member who was absent last session. Program director Darlyne Russian, [place], saw 2 patients. One patient graduated and discharged successfully.   Group Time: 2:30-4  Participation Level: Active  Behavioral Response: Appropriate and Sharing  Type of Therapy: Psycho-education Group  Interventions: CBT, Solution Focused and Strength-based  Topic: Patients met with counselors for 1.5 hour group psychoeducation session. A [facility] chaplain Bruce Messenger joined session and led relaxation and breathing exercise for 15 mins. Discussion included patients' core beliefs and some members were able to work extensively on challenging their negative core beliefs. Counselors utilized Therapist, sports from [employer] Meaning".    Summary: Patient was active and engaged in group today. She reported she did not attend any AA meetings since Monday but was planning to attend one tonight. She rt that she is chairing her homegroup meeting later this month. She st she enjoys big book studies. She is working on Engineer, maintenance (IT) her Investment banker, corporate and rt doing 15 mins today. She st she continues to feel extremely tired at day's end and is sleeping around 8-10 hours per night. Patient shared that she avoided  the music festival crowds last weekend and is learning she "does not enjoy big crowds". She revealed to chaplain that she is most concerned with " being perfect". Chaplain explored this idea with her and how her goal of perfection sets her up for failure. Patient agreed and seemed to be genuinely challenged and compelled by discussion about her perfectionist tendencies. [name], LPCA   UDS collected: No Results: Pending  AA/NA attended?: No  Sponsor?: Yes   [name], LCAS 01/25/2016 11:05 AM

## 2016-01-26 ENCOUNTER — Encounter (HOSPITAL_COMMUNITY): Payer: Self-pay | Admitting: Psychology

## 2016-01-26 NOTE — Progress Notes (Signed)
    Daily Group Progress Note  Program: CD-IOP   01/26/2016 Jocelyn Lamer [id number]  Diagnosis:  No diagnosis found.   Sobriety Date: 01/18/16  Group Time: 1-2:30 pm  Participation Level: Active  Behavioral Response: Sharing  Type of Therapy: Process Group  Interventions: Strength-based  Topic: Counselors met with patients to process recovery-related activities they had engaged in since the prior meeting. Several of the newer group members met with the program director to discuss medication management. One new patient joined the group. He shared about himself and his addiction. All members were active and engaged in the discussion concerning recovery from mind-altering drugs and alcohol.   Group Time: 2:30-4 pm  Participation Level: Active  Behavioral Response: Sharing  Type of Therapy: Psycho-education Group  Interventions: Strength-based  Topic: Core Beliefs: Counselors facilitated a discussion regarding negative core beliefs that group members had internalized. Often these negative core beliefs fuel or trigger a return to use or relapse. The counselors helped members identify these beliefs and began discussing how to refute them. Most members were active and engaged. Drug tests were collected today.    Summary: The patient presented as active and engaged in group. She reported attending a mix of AA and NA meetings (three total). Since the last meeting she had officially closed on her old house and filed divorce papers. She discussed ongoing conflicts with her ex-husband and how they typically trigger her use. She was teary as she recounted how difficult it is to get things accomplished because he always seems to do something wrong, or not at all, which leaves more for her to do. She confirmed that this weekend she was able to maintain her sobriety despite these external stressors. The patient reported accidentally taking too much Abilify which caused her to spend most of  the day on Sunday asleep or just laying around at home. She was easily able to identify negative core beliefs and had insight as to when they formed. The patient struggles with these core beliefs and are surely fuel her drug use. She responded well to this intervention, but these core beliefs will be evaluated more closely in the days ahead. A drug test was collected from the patient.    UDS collected: Yes Results: pending  AA/NA attended?: YesThursday, Friday and Saturday  Sponsor?: Yes   [name], LCAS 01/26/2016 1:10 PM

## 2016-01-29 ENCOUNTER — Other Ambulatory Visit (HOSPITAL_COMMUNITY): Payer: Self-pay | Admitting: Medical

## 2016-01-29 ENCOUNTER — Encounter (HOSPITAL_COMMUNITY): Payer: Self-pay | Admitting: Psychology

## 2016-01-29 ENCOUNTER — Other Ambulatory Visit (HOSPITAL_COMMUNITY): Payer: Self-pay | Admitting: Psychology

## 2016-01-29 DIAGNOSIS — F1020 Alcohol dependence, uncomplicated: Secondary | ICD-10-CM

## 2016-01-29 DIAGNOSIS — F1420 Cocaine dependence, uncomplicated: Secondary | ICD-10-CM

## 2016-01-29 DIAGNOSIS — F1994 Other psychoactive substance use, unspecified with psychoactive substance-induced mood disorder: Secondary | ICD-10-CM

## 2016-01-29 NOTE — Progress Notes (Signed)
Daily Group Progress Note  Program: CD-IOP   01/29/2016 Kelly Mills [id number]  Diagnosis:  No diagnosis found.   Sobriety Date: 01/17/16  Group Time: 1-3 pm  Participation Level: Active  Behavioral Response: Appropriate and Sharing  Type of Therapy: Process Group  Interventions: Biofeedback  Topic: Process: the first half of group was spent in process. Members shared about the things they had done to support their sobriety since we met yesterday. A lively discussion about the visitor we had yesterday in group. Members expressed strong feelings about the chaplain and whether they had benefitted from the interaction with him. Despite the various interpretations of any value or benefit that he might have provided the group, this session went over the usual time because members felt so adamant about yesterday's session. This counselor pointed out that the primary intention of the chaplain is to awaken people and stir up some feelings.  Apparently, mission was accomplished.   Group Time: 3-4 pm  Participation Level: Active  Behavioral Response: Sharing  Type of Therapy: Psycho-education Group  Interventions: Strength-based  Topic: Grieving Your Addiction: What does healthy grieving look like? A psycho-ed about grief was offered. Members were asked to identify what they have done in the past to grieve a loss? Members explained what they have done in the past as well as things they failed to do and have struggled with as a result. Before the session ended, members shared what they intended to do over the weekend to stay clean and sober.   Summary: The patient reported she had attended the "[facility]" AA meeting last night. It had been a good meeting and the topic was about 'being in the moment'. This is what she is working on and it is being emphasized constantly in this program. She had spent part of the day yesterday with her daughter shopping for school clothes. "It was  really nice to be with her", the patient shared. She went on to describe her current struggles, including her legal issues, frustration with estranged husband and fear of losing custody of their 2 daughters, ages 98 and 78. The patient reported she had a DWI in May and her ex is using this as a way to control his children and her access to them. The patient asked a good question in the second part of group. She wondered how the chaplain's visit had been of any benefit for the group. She expressed frustration over his comment to her about being 'a good talker' and she felt like she was insulted because she is a good talker. But not in a manipulative way. This comment invited other members to share about their experience of the session with the chaplain. In the discussion on grief, the patient reported that it takes time to grieve. She also shared that spending time with other people and talking about one's loss is important. The patient admitted she had not told her husband about her breast cancer diagnosis for over a week because she had wanted to 'feel normal'. She has repeated this assertion on several occasions in group and it was pointed out that ignoring what is going on does nothing good and is a type of denial. The patient seems to think by ignoring or denying, the problem will go away. She agreed that it only makes it worse. The patient was revealing and shared openly about her frustrations with the guest yesterday. She shared about her weekend plans and stated she was going to stay  busy and accountable. The patient has been struggling with sobriety and returned to use twice since she began the program.  It remains to be seen whether she can remain abstinent over the weekend.    UDS collected: No Results:   AA/NA attended?: YesWednesday  Sponsor?: Yes   [name], [place] 01/29/2016 9:24 AM

## 2016-01-30 ENCOUNTER — Telehealth (HOSPITAL_COMMUNITY): Payer: Self-pay | Admitting: Psychology

## 2016-01-30 LAB — PRESCRIPTION ABUSE MONITORING 17P, URINE
6-Acetylmorphine, Urine: NEGATIVE ng/mL
AMPHETAMINE SCREEN URINE: NEGATIVE ng/mL
BARBITURATE SCREEN URINE: NEGATIVE ng/mL
BENZODIAZEPINE SCREEN, URINE: NEGATIVE ng/mL
BUPRENORPHINE, URINE: NEGATIVE ng/mL
CARISOPRODOL/MEPROBAMATE, UR: NEGATIVE ng/mL
COCAINE(METAB.)SCREEN, URINE: NEGATIVE ng/mL
Creatinine(Crt), U: 28.7 mg/dL (ref 20.0–300.0)
EDDP, Urine: NEGATIVE ng/mL
Fentanyl, Urine: NEGATIVE pg/mL
MDMA SCREEN, URINE: NEGATIVE ng/mL
Meperidine Screen, Urine: NEGATIVE ng/mL
Methadone Screen, Urine: NEGATIVE ng/mL
Nitrite Urine, Quantitative: NEGATIVE ug/mL
OXYCODONE+OXYMORPHONE UR QL SCN: NEGATIVE ng/mL
Opiate Scrn, Ur: NEGATIVE ng/mL
PROPOXYPHENE SCREEN URINE: NEGATIVE ng/mL
Ph of Urine: 5.4 (ref 4.5–8.9)
Phencyclidine Qn, Ur: NEGATIVE ng/mL
SPECIFIC GRAVITY: 1.008
TAPENTADOL, URINE: NEGATIVE ng/mL
TRAMADOL SCREEN, URINE: NEGATIVE ng/mL

## 2016-01-30 LAB — ETHYL GLUCURONIDE, URINE

## 2016-01-30 LAB — ETHYL GLUCURONIDE LC/MS/MS
EtG/EtS LC/MS/MS: POSITIVE — AB
Ethyl Glucuronide: NEGATIVE
Ethyl Sulfate LC/MS/MS: 87 ng/mL
Ethyl Sulfate: POSITIVE — AB

## 2016-01-30 LAB — CANNABINOID (GC/MS), URINE
CANNABINOID UR: POSITIVE — AB
CARBOXY THC UR: 24 ng/mL

## 2016-01-30 NOTE — Progress Notes (Signed)
    Daily Group Progress Note  Program: CD-IOP   01/30/2016 Kelly Mills [id number]  Diagnosis:  Cocaine use disorder, severe, dependence ([place])  Alcohol use disorder, severe, dependence ([place])  Substance induced mood disorder ([place])   Sobriety Date: 01/18/16  Group Time: 1-2:30  Participation Level: Active  Behavioral Response: Appropriate and Sharing  Type of Therapy: Process Group  Interventions: CBT, Motivational Interviewing and Solution Focused  Topic: Patients met with counselors and intern for 1.5 hour group processing session. Counselors used open questions and reflection to help patients discuss their thoughts and feelings related to recovery from mind-altering drugs and alcohol. One new patient was present for his first group and introduced himself to the group. One patient met with program director for his orientation. UDS were collected from some group members. One patient discussed, at length, a conflict he had with his wife concerning finances and trust.   Group Time: 2:30-4  Participation Level: Active  Behavioral Response: Appropriate and Sharing  Type of Therapy: Psycho-education Group  Interventions: CBT, Solution Focused and Strength-based  Topic: Patients met with counselors and intern for 1.5 hour group psychoeducation session. Counselor and intern led activity on "the wheel of life" and seeking balance in recovery. Patients filled out a wheel of life in response to 36 psychologically-based questions in areas of finance, social, intellectual, emotional, spiritual, and physical. Patients reported that they enjoyed the session and found the questions to be helpful for revealing "blind spots" in their life. Counselors gave patients feedback on constructing goals to help fix their "problems".    Summary: Patient was active and engaged in session. She presented as upbeat, and somewhat minimizing about her decision to skip AA meetings which she planned to  go to this weekend. Patient did attend 3 AA meetings since last group, but had planned to attend 5. She reported she was busy having fun "nesting and painting her new house". Patient appears to be making a habit of skipping meetings she plans, yet still meeting the 4 meeting minimum/week. Patient stated she had thoughts of using but was able to make a plan to call friends in [place], and breathe her way through temptations. Patient reported she is taking on leadership roles in [place] and chairing her home group all month. Patient was confrontational with another member about a fight he was having with his wife but also found a way to align with him and balance her criticism with empathy. Patient appears to be improving her social interactions, negative self-talk, and distressing cognitions. Patient shared she felt "uneasy" about her weekend since she noticed some suspicious activity in her neighborhood, but has since been feeling more at ease. Patient did not indicate anything that implied her immediate harm or danger but discussed being awoken at 2 am by what she thought were people "checking unlocked doors" in her complex. A UDS was collected and results are pending [name], LPCA   UDS collected: Yes Results: Test from 01/22/16 still pending  AA/NA attended?: YesThursday, Saturday and Sunday  Sponsor?: Yes   [name], LCAS 01/30/2016 1:50 PM

## 2016-01-31 ENCOUNTER — Other Ambulatory Visit (HOSPITAL_COMMUNITY): Payer: Self-pay | Admitting: Psychology

## 2016-01-31 DIAGNOSIS — F32A Depression, unspecified: Secondary | ICD-10-CM

## 2016-01-31 DIAGNOSIS — F1420 Cocaine dependence, uncomplicated: Secondary | ICD-10-CM

## 2016-01-31 DIAGNOSIS — F1020 Alcohol dependence, uncomplicated: Secondary | ICD-10-CM

## 2016-01-31 DIAGNOSIS — F329 Major depressive disorder, single episode, unspecified: Secondary | ICD-10-CM

## 2016-02-01 ENCOUNTER — Other Ambulatory Visit (HOSPITAL_COMMUNITY): Payer: Self-pay | Admitting: Psychology

## 2016-02-01 DIAGNOSIS — F1020 Alcohol dependence, uncomplicated: Secondary | ICD-10-CM

## 2016-02-01 DIAGNOSIS — F39 Unspecified mood [affective] disorder: Secondary | ICD-10-CM

## 2016-02-01 DIAGNOSIS — F1420 Cocaine dependence, uncomplicated: Secondary | ICD-10-CM

## 2016-02-02 ENCOUNTER — Encounter (HOSPITAL_COMMUNITY): Payer: Self-pay | Admitting: Psychology

## 2016-02-02 ENCOUNTER — Other Ambulatory Visit (HOSPITAL_COMMUNITY): Payer: Self-pay | Admitting: Medical

## 2016-02-02 NOTE — Progress Notes (Signed)
Kelly Mills is a 45 y.o. female patient. CD-IOP: Individual Counseling Session. I met with this patient as agreed today at 12 pm. She has been struggling to maintain sobriety and not been successful on at least 3 occasions. Today we are meeting to discuss her plans going forward. The patient reported she has put into place a routine to start her day and was able to explain what she was going to do over the weekend. She had very detailed plans for the next few days. We agreed that making a plan, and sticking with it, is something she will have to make a commitment to. She has done things other than what she had said she would do in the past 2 weeks and this lack of accountability has cost her to change sobriety dates more than once. I explained the results of the URICA taken on Wednesday. It measures motivation and we were surprised to see that she measured in the 'contemplation' stage. This means the patient is still ambivalent about whether she wants to stop using cocaine and alcoholic. The patient didn't seem surprised and agreed that she is kind of like that at times. Sometimes, the patient reported, "I just want to get away with it". I asked her to identify what she will have in her life if she remains drug free? The patient was able to identify feeling better about herself, having more income because she is working, feeling better physically, and having her daughters every other week with a joint custody arrangement. On the other hand, if she does continue to use, the patient identified she will be where she is now, only much worse. We discussed how she can stay focused on the good things and remind herself of what will happen if she does relapse. Having these reminders present and in front of her can serve as deterrents. The patient noted she had phoned four women in recovery this morning and was able to speak with just one. She reported she has accepted she must use the phone numbers she has been given  and will call people daily. The patient presented as upbeat and motivated about this new routine she has formulated and the schedule she has made for the weekend. She has two very close friends she will be meeting with this weekend and a number of friends she will speak to as well as AA meetings every day. A drug test was collected as the session came to an end. The patient understands that there are no more relapses allowed. Any return to use will results in a referral to a higher level of care. The patient responded well to this intervention. She was very stable and feeling positive about her life. We will continue to follow closely in the days ahead. Her sobriety date remains 9/20.        [name], LCAS

## 2016-02-05 ENCOUNTER — Other Ambulatory Visit (HOSPITAL_COMMUNITY): Payer: Self-pay | Admitting: Psychology

## 2016-02-05 ENCOUNTER — Encounter (HOSPITAL_COMMUNITY): Payer: Self-pay | Admitting: Psychology

## 2016-02-05 DIAGNOSIS — F1020 Alcohol dependence, uncomplicated: Secondary | ICD-10-CM

## 2016-02-05 DIAGNOSIS — F1994 Other psychoactive substance use, unspecified with psychoactive substance-induced mood disorder: Secondary | ICD-10-CM

## 2016-02-05 DIAGNOSIS — F1420 Cocaine dependence, uncomplicated: Secondary | ICD-10-CM

## 2016-02-05 NOTE — Progress Notes (Addendum)
    Daily Group Progress Note  Program: CD-IOP   02/05/2016 Jocelyn Lamer [id number]  Diagnosis:  No diagnosis found.   Sobriety Date: 12/26/15  Group Time: 1-2:30 pm  Participation Level: Active  Behavioral Response: Sharing  Type of Therapy: Process Group  Interventions: Biofeedback  Topic: Process: the first part of group was spent in process. Members shared about what they had done to support their sobriety since we last met. They also discussed any challenges or temptations they had also experienced. Two guests were present to observe group and they introduce themselves. A new group member was present and she was asked to disclose what had brought her here. The session ended with a 10-minute guided meditation.   Group Time: 2:30-4 pm  Participation Level: Active  Behavioral Response: Appropriate and Sharing  Type of Therapy: Psycho-education Group  Interventions: Strength-based  Topic: Psycho-Ed/Graduation: the second half of group was spent in a psycho-ed on Self-Care. A slide show was presented with emphasis on diet, sleep and exercise. Education, based on the most recent research, was provided on each of these topics and members discussed what their lifestyles have been in each instance. The psycho-ed ended early to allow for a graduation ceremony. Brownies were provided and the medallion was passed around with each member offering words of hope and gratitude to the graduating member.   Summary: The patient reported she had gone home after the group session yesterday and walked her dog. She reminded the group it had been a very 'emotional session' and she had needed to be quiet and to think about things. The patient reported she had attended the [address] [employer] meeting last night and it had been a good one. She had seen another member there. the patient reported she has begun following a routine and intends to change things up and keep a schedule. The patient  reported she is still scheduled for court on Monday, but hopes to hear from her attorney about whether she needs to be there or it will be continued. During the psycho-ed, the patient admitted that she did not eat when she was drinking or drugging. It just didn't appeal to her. When it came to sleep, she noted, "I didn't' want to wake up". And as for exercise, the patient reported that doing yoga and going to the gym had not been a part of her life in the past year or so as exercise had been in the past. The patient shared words of thanks and hope to the graduating member. They agreed they would continue to see each other in meetings. The patient displayed more motivation to remain sober today and shared about her plans to follow through on a very detailed routine. She has not stayed clean for the last 2 weekends and it remains to be seen if she can refrain from cocaine and alcohol use over the next 4 days. If she returns to use, the patient understands she will be discharged and referred to a higher level of care.  UDS collected: No Results:  AA/NA attended?: YesWednesday  Sponsor?: No   [name], LCAS 02/05/2016 9:22 AM

## 2016-02-06 ENCOUNTER — Encounter (HOSPITAL_COMMUNITY): Payer: Self-pay | Admitting: Psychology

## 2016-02-06 ENCOUNTER — Other Ambulatory Visit (HOSPITAL_COMMUNITY): Payer: Self-pay | Admitting: Medical

## 2016-02-06 NOTE — Progress Notes (Signed)
    Daily Group Progress Note  Program: CD-IOP   02/06/2016 Kelly Mills [id number]  Diagnosis:  No diagnosis found.   Sobriety Date: 01/31/16  Group Time: 1-2:30 pm  Participation Level: Active  Behavioral Response: Sharing  Type of Therapy: Process Group  Interventions: Strength-based  Topic: Process: the first half of group was spent in process. Members shared about the past weekend and any activities they had engaged in to support or strengthen their recovery. They also shared about any challenges or temptations they may have experienced. A new group member was present and he introduced himself. The program director was here today and met with a group member. Random drug tests were collected.   Group Time: 2:30-4 pm  Participation Level: Active  Behavioral Response: Sharing  Type of Therapy: Psycho-education Group  Interventions: Solution Focused  Topic: Psycho-Ed: the second half of group began with a 5-minute meditation. Group members provided valuable feedback about the music and this type of meditation. The topic for the psycho-ed was "Resentments". Members shared about why resentments are the #1 offender, as quoted in the [employer]. The benefits that resentments provide were identified and a handout on addressing one's resentments was distributed with plans to review in upcoming sessions.   Summary: The patient presented today as upbeat and enthusiastic. She shared that the last few days had been 'good' and she had followed her routine and it 'wasn't that difficult'. Another group member, who had been absent on Thursday, shared that he had been very concerned about her and had said some prayers for her. This was a powerful disclosure and the patient expressed gratitude for his prayers. She disclosed how she had taken the suggestion of her counselor and is carrying a picture of her 2 daughters in the car. This is to remind her of what she stands to lose if she seeks  out cocaine in the days ahead. She is also wearing a rubber band around her wrist and snapping it when she has any thoughts. Another group member had suggested this last week and she had liked the idea. The patient reported she has a new temporary sponsor and is very pleased. Her old sponsor was not available as much as she needed her and was too much like a friend. The patient reported she had attended 5 AA meetings over the weekend and is beginning Step 2. When asked about the meditation, the patient reported she had liked it and had been able to focus her mind inward. She reminded the group that her mind works 24/7 and she struggles to turn it off. In the psycho-ed, the patient agreed with the importance of resentments, but noted her biggest one is towards her husband, who isn't even her "ex" yet. The patient provided helpful and encouraging feedback to her fellow group members and she responded well to this intervention. A drug test was collected from the patient today. She has no further options should she return to use. We will follow closely in the days ahead.    UDS collected: Yes Results: pending  AA/NA attended?: Yes Thursday, Friday and Sunday  Sponsor?: Yes   [name], LCAS 02/06/2016 8:50 AM

## 2016-02-06 NOTE — Progress Notes (Signed)
    Daily Group Progress Note  Program: CD-IOP   02/06/2016 Kelly Mills [id number]  Diagnosis:  No diagnosis found.   Sobriety Date: 01/30/16  Group Time: 1-2:30 pm  Participation Level: Active  Behavioral Response: Sharing  Type of Therapy: Process Group  Interventions: Strength-based  Topic: Process: Counselors met with patients to discuss activities they had engaged in to support their recovery. Several group members met with the program director to discuss medication management and to prepare for discharge. All members were active and engaged in the discussion concerning recovery from mind-altering drugs and alcohol.   Group Time: 2:30-4 pm  Participation Level: Active  Behavioral Response: Appropriate and Sharing  Type of Therapy: Psycho-education Group  Interventions: Solution Focused  Topic: Psychoeducation: Counselors met with patients to discuss relapse prevention skills and answer questions about 12 step programs. Patients were able to discuss what relapse prevention skills they used, and how successful they believed them to be. Counselors and group members provided constructive feedback regarding these skills. Drug tests were collected from most members.    Summary: Patient presented as active and engaged in the group. The patient was intermittently tearful and remorseful throughout the session. The patient admitted to using cocaine after chairing her home group AA meeting on Monday. This revelation led to a discussion regarding usage, triggers, and relapse prevention. The patient was upset about using and responded well to the feedback of others. She admitted that she wanted her cravings to "fucking go away" and that she "didn't want this." The counselors encouraged her to call someone when she was considering using. The patient asked that the counselors drug test her every time she came to the group as this was her "last chance." Counselors administered a  URICA assessment. The patient was determined to be in the contemplative stage. The patient attended 1 AA meeting. It remains to be seen whether this patient can remain abstinent without a more structured program.    UDS collected: No Results:   AA/NA attended?: YesMonday  Sponsor?: Yes   [name], [place] 02/06/2016 10:24 AM

## 2016-02-07 ENCOUNTER — Other Ambulatory Visit (HOSPITAL_COMMUNITY): Payer: Self-pay | Admitting: Psychology

## 2016-02-07 DIAGNOSIS — F32A Depression, unspecified: Secondary | ICD-10-CM

## 2016-02-07 DIAGNOSIS — F1420 Cocaine dependence, uncomplicated: Secondary | ICD-10-CM

## 2016-02-07 DIAGNOSIS — F1020 Alcohol dependence, uncomplicated: Secondary | ICD-10-CM

## 2016-02-07 DIAGNOSIS — F329 Major depressive disorder, single episode, unspecified: Secondary | ICD-10-CM

## 2016-02-07 LAB — TOXASSURE SELECT,+ANTIDEPR,UR

## 2016-02-08 ENCOUNTER — Other Ambulatory Visit (HOSPITAL_COMMUNITY): Payer: Self-pay | Admitting: Psychology

## 2016-02-09 ENCOUNTER — Encounter (HOSPITAL_COMMUNITY): Payer: Self-pay | Admitting: Psychology

## 2016-02-09 NOTE — Progress Notes (Signed)
Kelly Mills is a 45 y.o. female patient. CD-IOP: Treatment Planning Session. Met with the patient this morning as scheduled. She had had a busy morning and noted she was meeting a good friend for lunch downtown. The patient has a very supportive network of friends, but she has been reluctant to reach out to them when she needed help. Today, she reports a strong commitment to remain abstinent today and through the weekend. I explained the importance of reviewing her goals and identifying progress to date. Her #1 goal of treatment remains sobriety. The patient has struggled with this goal and relapsed on 3 separate occasions. She signed a behavioral contract earlier this week that stipulates any further use will result in referral to higher level of care. The patient has remained drug-free for 10 days and admitted that maybe knowing the severe consequences of continued use will motivate her even more. The second treatment goal includes building support within the recovery community is something she has done better with. Patient secured a new sponsor and is going to meetings daily. Her 3rd goal is to address her DWI. She completed a DWI assessment and her provider has agreed to honor the hours the patient accrues here in the CD-IOP. The patient's court date has been continued on at least two occasions. Her 4th goal relates to improving her self-esteem. Today the patient reported she has begun a routine in the morning and is feeling better about herself having gotten up early and completed reading and meditation. She agreed that her slips have not enhanced her sense of self, but today she is feeling good about herself and intent on doing 'the next right thing'. The patient discussed the resentment handout we had completed yesterday in group. I read part of it and it related to her resentment towards her ex-husband. Their divorce was only finalized earlier this week. The patient agreed that this is something that  will take time. Right now he has the two girls and until they are 46 yo, she will have to engage with him in some capacity. The update was written up, reviewed and signed. The patient reported she has lots of plans this weekend and will keep busy and connected with her support network. We will continue to follow closely in the days ahead. The patient responded well to this intervention and her sobriety date remains 9/20.        [name], LCAS

## 2016-02-10 ENCOUNTER — Encounter (HOSPITAL_COMMUNITY): Payer: Self-pay | Admitting: Psychology

## 2016-02-10 NOTE — Progress Notes (Signed)
Daily Group Progress Note  Program: CD-IOP   02/10/2016 Jocelyn Lamer [id number]  Diagnosis:  No diagnosis found.   Sobriety Date: 9/20  Group Time: 1-2:30 pm  Participation Level: Active  Behavioral Response: Appropriate and Sharing  Type of Therapy: Process Group  Interventions: Supportive  Topic: Process: Counselors met with patients to discuss activities they had engaged in to aid in their recovery. Several group members met with the program director for orientation activities and to discuss medication management. All members were active and engaged in discussion concerning recovery from mind-altering drugs and alcohol.   Group Time: 2:30-4 pm  Participation Level: Active  Behavioral Response: Sharing  Type of Therapy: Psycho-education Group  Interventions: Solution Focused  Topic: Psycho-education: A chaplain associated with [facility] led the psychoeducation portion of the group. The session began with a mindfulness exercise, and the chaplain presented a CBT based flow chart regarding patient reactions to negative experiences. Counselors helped facilitate discussion among group members regarding this topic. Drug tests were collected from several group members. PHQ-9 assessments were administered to all group members.   Summary: The patient presented as active and engaged in the group. She reported being "stressed" last week due to final pre-divorce issues . However, as of Monday, she received a notification that her divorce had been finalized. She reported being "elated" at the news and had brief thoughts of using. However, she was able to make plans that helped her maintain her sobriety. She shared that her "internal noise" was reduced and that she was experiencing a "perfect storm of good things." She is no longer experiencing stress dreams about drowning. Patient reports that she has been able to establish a routine and invite spirituality into her life. The  patient was able to attend 2 12-step meetings. She also completed the resentment worksheet from the previous session. Counselor's encouraged her to continue establishing a workable routine. Counselor administered PHQ-9 assessment as part of ongoing record keeping and monitoring the patient's mood changes or improved mood.    UDS collected: No Results:   AA/NA attended?: YesMonday and Tuesday  Sponsor?: Yes   [name], LCAS 02/10/2016 1:08 PM

## 2016-02-12 ENCOUNTER — Other Ambulatory Visit (HOSPITAL_COMMUNITY): Payer: Self-pay | Admitting: Medical

## 2016-02-12 ENCOUNTER — Other Ambulatory Visit (HOSPITAL_COMMUNITY): Payer: Self-pay | Attending: Medical | Admitting: Psychology

## 2016-02-12 ENCOUNTER — Encounter (HOSPITAL_COMMUNITY): Payer: Self-pay | Admitting: Psychology

## 2016-02-12 DIAGNOSIS — F1424 Cocaine dependence with cocaine-induced mood disorder: Secondary | ICD-10-CM | POA: Insufficient documentation

## 2016-02-12 DIAGNOSIS — F1020 Alcohol dependence, uncomplicated: Secondary | ICD-10-CM

## 2016-02-12 DIAGNOSIS — F1229 Cannabis dependence with unspecified cannabis-induced disorder: Secondary | ICD-10-CM | POA: Insufficient documentation

## 2016-02-12 DIAGNOSIS — F1420 Cocaine dependence, uncomplicated: Secondary | ICD-10-CM

## 2016-02-12 DIAGNOSIS — F319 Bipolar disorder, unspecified: Secondary | ICD-10-CM | POA: Insufficient documentation

## 2016-02-12 LAB — TOXASSURE SELECT,+ANTIDEPR,UR

## 2016-02-12 NOTE — Progress Notes (Signed)
    Daily Group Progress Note  Program: CD-IOP   02/12/2016 Kelly Mills [id number]  Diagnosis:  Cocaine use disorder, severe, dependence ([place])  Alcohol use disorder, severe, dependence ([place])  Moderate episode of recurrent major depressive disorder ([place])   Sobriety Date: 01/31/16  Group Time: 1-2:30  Participation Level: Active  Behavioral Response: Appropriate and Sharing  Type of Therapy: Process Group  Interventions: CBT and Supportive  Topic: Patients were active and engaged for group process session. Counselors encouraged patients to share openly about their successes and challenges in recovery from mind-altering drugs and alcohol. Patients took turns checking in and updating the group on recovery-related activities.   Group Time: 2:30-4  Participation Level: Active  Behavioral Response: Appropriate and Sharing  Type of Therapy: Psycho-education Group  Interventions: Other: Spirituality psychoeducation  Topic: Patients were active and engaged for group psychoeducation session. Counselors facilitated a discussion on "spirituality and recovery". Patients discussed their personal faith stories, higher powers, and how prayer can help aid in recovery. Many patients reported that the session was helpful and "opened their eyes to other perspectives".    Summary: Patient arrived 15 minutes late due to an appointment running late at the bank. Patient shared that she was feeling hopeful lately and has been focusing on eating healthy, sleeping right, and talking with her sponsor daily. Patient stated that she was also feeling stressed since she had financial troubles which caused her frustration with her ex-husband. Patient reported she is keeping a Corporate investment banker daily. She visited with her daughters last night which was "positive and encouraging". Patient appears to be stabilizing in very early recovery and reports no recent alcohol or drug usage. She presents as active  and engaged in session and well-groomed. She reported that she is now focusing on getting a job since she wants her income to be more predictable and stable. [name], LPC-A   UDS collected: Yes Results: Pending  AA/NA attended?: No  Sponsor?: Yes   [name], LCAS 02/12/2016 10:22 AM

## 2016-02-14 ENCOUNTER — Other Ambulatory Visit (HOSPITAL_COMMUNITY): Payer: Self-pay | Admitting: Medical

## 2016-02-14 ENCOUNTER — Other Ambulatory Visit (HOSPITAL_COMMUNITY): Payer: Self-pay | Admitting: Psychology

## 2016-02-14 DIAGNOSIS — F1994 Other psychoactive substance use, unspecified with psychoactive substance-induced mood disorder: Secondary | ICD-10-CM

## 2016-02-14 DIAGNOSIS — F1020 Alcohol dependence, uncomplicated: Secondary | ICD-10-CM

## 2016-02-14 DIAGNOSIS — F1420 Cocaine dependence, uncomplicated: Secondary | ICD-10-CM

## 2016-02-14 NOTE — Progress Notes (Signed)
    Daily Group Progress Note  Program: CD-IOP   02/14/2016 Kelly Mills [id number]  Diagnosis:  No diagnosis found.   Sobriety Date: 9/20  Group Time: 1-2:30 pm  Participation Level: Active  Behavioral Response: Appropriate and Sharing  Type of Therapy: Activity Group  Interventions: Chair Yoga  Topic: Chair Yoga: the first part of group was spent in an activity downstairs in the gym. Members were escorted downstairs and sat in chairs arranged in a circle. A certified yoga instructor was waiting for the group and explained what this session would look like. She asked about previous experiences with yoga and whether members had any physical problems or issues that they should be aware of. The session continued with members following the instructor in different poses and movements. At the completion of the session, members were invited to process any feelings or bodily experiences they may have noticed during the session.      Group Time: 2:30-4 pm  Participation Level: Active  Behavioral Response: Appropriate and Sharing  Type of Therapy: Process Group  Interventions: Supportive  Topic: Process: the second half of group was spent in process. Members shared about the past weekend and identified any challenges or temptations they might have experienced. They shard what they had done to strengthen their recovery, including attending meetings, step work, sessions with sponsor and different types of good self-care. There were no new sobriety dates, indicating that everyone had remained abstinent over the weekend. Drug tests were collected from random group members.   Summary: The patient reported she had done yoga for years, but had injured her right foot badly and had to make accommodations for that. She enjoyed the yoga and agreed that it didn't take much to warm up her body and if felt good. The patient reported she had cooked dinner for friends to celebrate her divorce.  She had also spent quality time with her daughters and enjoyed listening to them and watching them grow up. The patient attended 3 AA meetings over the weekend. She had seen another member at two of them. She is practicing her morning routine of prayer, reflection and gratitude. The patient admitted that the structure that this program gives her life is very helpful and was very absent over the past 5 or 6 months as she struggled with her addiction. The patient made some insightful comments and responded well to this intervention. A drug test was collected from this patient today.   UDS collected: Yes Results: Pending  AA/NA attended?: YesThursday and Friday  Sponsor?: Yes   [name], [place] 02/14/2016 9:43 AM

## 2016-02-15 ENCOUNTER — Encounter (HOSPITAL_COMMUNITY): Payer: Self-pay | Admitting: Psychology

## 2016-02-15 ENCOUNTER — Ambulatory Visit (HOSPITAL_BASED_OUTPATIENT_CLINIC_OR_DEPARTMENT_OTHER): Payer: Self-pay | Admitting: Oncology

## 2016-02-15 ENCOUNTER — Other Ambulatory Visit (HOSPITAL_COMMUNITY): Payer: Self-pay | Admitting: Psychology

## 2016-02-15 ENCOUNTER — Other Ambulatory Visit (HOSPITAL_COMMUNITY): Payer: Self-pay | Admitting: Medical

## 2016-02-15 VITALS — BP 147/93 | HR 128 | Temp 97.9°F | Resp 18 | Ht 67.0 in | Wt 171.8 lb

## 2016-02-15 DIAGNOSIS — F1994 Other psychoactive substance use, unspecified with psychoactive substance-induced mood disorder: Secondary | ICD-10-CM

## 2016-02-15 DIAGNOSIS — Z170 Estrogen receptor positive status [ER+]: Secondary | ICD-10-CM

## 2016-02-15 DIAGNOSIS — F1020 Alcohol dependence, uncomplicated: Secondary | ICD-10-CM

## 2016-02-15 DIAGNOSIS — C773 Secondary and unspecified malignant neoplasm of axilla and upper limb lymph nodes: Secondary | ICD-10-CM

## 2016-02-15 DIAGNOSIS — Z79810 Long term (current) use of selective estrogen receptor modulators (SERMs): Secondary | ICD-10-CM

## 2016-02-15 DIAGNOSIS — C50411 Malignant neoplasm of upper-outer quadrant of right female breast: Secondary | ICD-10-CM

## 2016-02-15 DIAGNOSIS — Z720 Tobacco use: Secondary | ICD-10-CM

## 2016-02-15 DIAGNOSIS — C50811 Malignant neoplasm of overlapping sites of right female breast: Secondary | ICD-10-CM

## 2016-02-15 DIAGNOSIS — F1420 Cocaine dependence, uncomplicated: Secondary | ICD-10-CM

## 2016-02-15 MED ORDER — TAMOXIFEN CITRATE 20 MG PO TABS: 20.0000 mg | ORAL_TABLET | Freq: Every day | ORAL | 2 refills | Status: DC

## 2016-02-15 NOTE — Progress Notes (Signed)
[facility]  [phone] [phone]  OFFICE PROGRESS NOTE   ID: Kelly Mills   DOB: 05/03/1971  MR#: [id number]  CSN#:[id number]   PCP: WILLARD,JENNIFER, PA-C SU: Matthew Wakefield, MD OTHER MD: Robert Murray, MD  CHIEF COMPLAINT: left breast cancer  CURRENT TREATMENT: tamoxifen daily  BREAST CANCER HISTORY: From Dr. Peter Rubin's new patient evaluation dated 07/17/2011:  "The patient has been in good health. She has had previous mammograms. Last mammogram was in May 2012. She self detected a mass in her left breast in January of this year. She sought medical attention for this and was referred for mammogram and ultrasound. This took place on 07/03/2011. At that time a firm palpable mass was noted at 10:00 position. This measured 1.8 x 1.7 x 1.6 cm. Prominent lymph node was seen in the axilla. Biopsies of both areas were was performed. Both the breast and lymph node showed invasive mammary carcinoma he ER status was 98% PR +80%, proliferative index 84% HER-2 was negative with a ratio 0.96 MRI scan of both breasts performed 07/13/2011 showed a mass measuring 3.3 x 2.3 x 2 cm. In addition there was an enhancing nodule located 8 mm posterior inferior to the larger mass measuring 1.1 x 0.8 x 0.9 cm. In aggregate this total area measured 4.2 cm."  Her subsequent history is as detailed below.     INTERVAL HISTORY: Kelly Mills returns today for follow up of her estrogen receptor positive breast cancer. She continues on tamoxifen. Generally she tolerates that well. She is on low-dose venlafaxine for hot flashes and when she stopped taking that the hot flashes recurred with a vengeance. She is now back on that medication and the hot flashes are minimal. She has a very mild vaginal discharge from tamoxifen. She obtains a drug at approximately $10 a month.  REVIEW OF SYSTEMS: Kelly Mills has gone through major changes in her life in the last 3 years. She is separated from  her husband about 2-1/2 years ago and her divorce is now completed. He has custody of the children because of her history of alcohol abuse but she has been attending AA, has a psychologist that she is seeing, and she is getting tested very frequently. Unfortunately she did relapse in May had a DUI and that has set the whole custody agreement processes back for her. Overall though she likes her work she likes her new living arrangements, and she is keeping in touch with HER-2 daughters although they do live with her husband. Aside from all these issues, she is concerned a little bit about weight gain is not exercising as much as she would like. A detailed review of systems today was otherwise stable  PAST MEDICAL HISTORY: Past Medical History:  Diagnosis Date  . Breast cancer (HCC)    right  . ETOH abuse   . History of chemotherapy    completed 11/13/11  . History of radiation therapy 01/06/12-02/25/12   right breast,5040 cgy/28 sessions, boost 1440 cGy/8 sessions  . Insomnia     PAST SURGICAL HISTORY: Past Surgical History:  Procedure Laterality Date  . AXILLARY LYMPH NODE DISSECTION  12/04/2011   Procedure: AXILLARY LYMPH NODE DISSECTION;  Surgeon: Matthew Wakefield, MD;  Location: [facility];  Service: General;  Laterality: Right;  . BREAST LUMPECTOMY  12/04/11   right, ER/PR +, HER 2 -  . NASAL SEPTUM SURGERY    . PORT-A-CATH REMOVAL  12/04/2011   Procedure: REMOVAL PORT-A-CATH;  Surgeon: Matthew Wakefield,   MD;  Location: [facility];  Service: General;  Laterality: Left;  . PORTACATH PLACEMENT  07/22/2011   Procedure: INSERTION PORT-A-CATH;  Surgeon: Rolm Bookbinder, MD;  Location: [facility] OR;  Service: General;  Laterality: N/A;  Insertion of Port-A-Cath    FAMILY HISTORY Family History  Problem Relation Age of Onset  . Cancer Maternal Grandfather     melanoma  . Diabetes Paternal Grandfather   . Arthritis Mother   . Hypertension Father   . Psoriasis  Brother   . Anesthesia problems Neg Hx     GYNECOLOGIC HISTORY: G5P2.  Hx of frequent clomid injections and other fertility drugs over 2 yrs.  has had very intermittent periods in the last year and a half. She has a copper IUD in place  SOCIAL HISTORY: [name] and her husband are divorced, but both reside in [place], [place].  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  They have 2 daughters aged 49 and 72.  ADVANCED DIRECTIVES: Not on file; at the 07/26/2014 visit the patient was given the appropriate documents 2 complete and notarize at her discretion  HEALTH MAINTENANCE: Social History  Substance Use Topics  . Smoking status: Current Some Day Smoker  . Smokeless tobacco: Never Used  . Alcohol use Yes    Colonoscopy: N/A PAP: Not on file Bone density: Not on file Lipid panel: Not on file  Allergies  Allergen Reactions  . Codeine Nausea Only    Current Outpatient Prescriptions  Medication Sig Dispense Refill  . ARIPiprazole (ABILIFY) 2 MG tablet Take 1 tablet daily for 5 days then take 2 tablets daily 55 tablet 0  . baclofen (LIORESAL) 20 MG tablet 1/2 tablet daily for 1 week then 1 tablet QD 30 each 2  . ibuprofen (ADVIL,MOTRIN) 200 MG tablet Take 200 mg by mouth every 6 (six) hours as needed.    . Multiple Vitamin (MULTIVITAMIN) capsule Take 1 capsule by mouth daily.    . potassium chloride (MICRO-K) 10 MEQ CR capsule Take 2 capsules (20 mEq total) by mouth 2 (two) times daily. 12 capsule 0  . tamoxifen (NOLVADEX) 20 MG tablet Take 1 tablet (20 mg total) by mouth daily. 30 tablet 2  . venlafaxine XR (EFFEXOR-XR) 75 MG 24 hr capsule Take 1 capsule (75 mg total) by mouth daily with breakfast. 30 capsule 2   No current facility-administered medications for this visit.     OBJECTIVE: Middle-aged white woman In no acute distress Vitals:   02/15/16 1415  BP: (!) 147/93  Pulse: (!) 128  Resp: 18  Temp: 97.9 F (36.6 C)      Body mass index is 26.91 kg/m.      ECOG FS:  0  Sclerae unicteric, pupils round and equal Oropharynx clear and moist-- no thrush or other lesions No cervical or supraclavicular adenopathy Lungs no rales or rhonchi Heart regular rate and rhythm Abd soft, nontender, positive bowel sounds MSK no focal spinal tenderness, no upper extremity lymphedema Neuro: nonfocal, well oriented, appropriate affect Breasts: The right breast is status post lumpectomy and radiation. There is no evidence of local recurrence. The right axilla is benign. Left breast is unremarkable   LAB RESULTS: Lab Results  Component Value Date   WBC 9.2 12/31/2015   NEUTROABS 5.5 12/31/2015   HGB 13.3 12/31/2015   HCT 41.0 12/31/2015   MCV 100.2 (H) 12/31/2015   PLT 289 12/31/2015      Chemistry  Component Value Date/Time   NA 138 12/31/2015 1600   NA 141 01/25/2015 0841   K 3.8 12/31/2015 1600   K 2.9 (LL) 01/25/2015 0841   CL 106 12/31/2015 1600   CL 101 08/18/2012 1059   CO2 26 12/31/2015 1600   CO2 25 01/25/2015 0841   BUN 14 12/31/2015 1600   BUN 12.8 01/25/2015 0841   CREATININE 0.67 12/31/2015 1600   CREATININE 0.8 01/25/2015 0841      Component Value Date/Time   CALCIUM 9.1 12/31/2015 1600   CALCIUM 9.4 01/25/2015 0841   ALKPHOS 63 12/31/2015 1600   ALKPHOS 66 01/25/2015 0841   AST 18 12/31/2015 1600   AST 33 01/25/2015 0841   ALT 18 12/31/2015 1600   ALT 33 01/25/2015 0841   BILITOT 0.4 12/31/2015 1600   BILITOT 0.56 01/25/2015 0841      Lab Results  Component Value Date   LABCA2 16 04/10/2012    Urinalysis No results found for: COLORURINE  STUDIES: No results found.   ASSESSMENT: 45 y.o. BRCA negative [place]:  1.  Status post right breast Upper outer quadrant needle core biopsy at the 9 o'clock position on 07/08/2011 which showed invasive mammary carcinoma, ER 98%, PR 80%, Ki-67 84%, HER-2/neu negative.  She also had a right needle core biopsy of right axilla  which was positive for metastatic mammary carcinoma.  2.  Status post bilateral breast MRI on 07/13/2011 which showed an irregular enhancing mass located laterally within the posterior third of the right breast at the 9 o'clock position with central clip artifact consistent with the diagnosed invasive mammary carcinoma. The mass measured 3.3 x 2.3 x 2.0 cm in size. There was an enhancing 1.1 x 0.8 x 0.9 cm satellite nodule located 8 mm posterior and inferior to the larger mass. In aggregate the larger mass and the satellite nodule measure 4.2 cm in greatest dimension. There were no additional or worrisome enhancing lesions within the right breast or within the left breast.  Two adjacent abnormal appearing level I right axillary lymph nodes worrisome for metastatic lymph nodes.  3.  Status post right needle core biopsy on 07/19/2011 at the 7 o'clock position which showed invasive mammary carcinoma ER 98%, PR 26%, Ki-67 83%, HER-2/neu negative.  4.  A genetic counseling and testing letter dated 07/26/2011 showed the patient was negative for the BRCA1 and BRCA2 gene mutations.  5.  Status post neoadjuvant chemotherapy with FEC x4 cycles with Neulasta support from 08/07/2011 through 09/18/2011.  This was followed by neoadjuvant Taxotere x4 cycles with Neulasta support from 10/03/2011 through 11/13/2011.  6.  Status post right breast lumpectomy with regional node resection of right axillary contents on 12/03/2012 for a stage IIB, ypT1c, pN1a, invasive ductal carcinoma, grade 1, 3/8 lymph nodes positive for metastatic mammary carcinoma (52m, 357m and 71m75mwith extracapsular tumor extension identified, prognostic profile was not repeated.  There was insufficient invasive carcinoma present for HER-2 analysis.  7.  Status post radiation therapy from 01/06/2012 through 02/25/2012..  8.  Tamoxifen started in 02/2012.  9. Alcohol abuse - currently sober past 4 months plus  PLAN: AdrMinna Merritts now a little over 3  years out from definitive surgery for her breast cancer, with no evidence of disease recurrence. This is very favorable  The plan is to continue tamoxifen likely for a total of 10 years. She is tolerating it well and is able to afford it.  She has multiple psychosocial issues but does appear to be making  great strides in terms of getting her life in order and seems to me to have good insight into her situation. She is seeking appropriate health and using it appropriately. All this is very hopeful.  If she sees her primary care physician in April, after her March mammography, she can return and see me again in one year. She knows to call for any problems that may develop before that visit. Chauncey Cruel, MD  02/15/2016, 2:51 PM

## 2016-02-16 NOTE — Progress Notes (Signed)
    Daily Group Progress Note  Program: CD-IOP   02/16/2016 Kelly Mills [id number]  Diagnosis:  Cocaine use disorder, severe, dependence ([place])  Alcohol use disorder, severe, dependence ([place])  Substance induced mood disorder ([place])   Sobriety Date: 01/31/16  Group Time: 1-2:30  Participation Level: Active  Behavioral Response: Appropriate and Sharing  Type of Therapy: Process Group  Interventions: CBT and Solution Focused  Topic: Counselors met with patients for 1.5 hour group discussion focused on processing emotions, thoughts, and behaviors related to recovery from drugs/alcohol. Patients were active and engaged. Counselors used open questions and reframing to help guide patients into a deeper understanding of their experience. Some members shared feedback and one member apologized for previous "misguided advice-giving". Counselor led group in a 5 minute guided imagery meditation utilizing the image of a candle and stress-relief.     Group Time: 2:30-4  Participation Level: Active  Behavioral Response: Appropriate and Sharing  Type of Therapy: Psycho-education Group  Interventions: Meditation: Guided imagery  Topic: Counselors met with patients for 1.5 hour group psychoeducation session focused on "Addiction and the Brain". Counselors utilized a Arboriculturist with discussion questions, research data, and videos from "pleasure unwoven" to educate members on the neuroscience of addiction. Members discussed the role of Dopamine in addiction and the potential stigma of living in recovery from drugs/alcohol. Patients were active and engaged and asked specific questions to better understand the topic.    Summary: Patient presented as active and engaged in group sessions. Patient stated she attended 1 AA meeting since yesterday. Patient stated she was happy to be back to her daily routine after a recent trip to [place]. She read recovery literature and meditated  in the morning. Patient had rapid speech when she checked in which may have been due to nerves about a cancer checkup today. Counselor discussed UDS results and confirmed that patient understood she should have no more lapses on drugs or alcohol. [name], LPC-A   UDS collected: Yes Results: pending  AA/NA attended?: YesThursday  Sponsor?: Yes   [name], LCAS 02/16/2016 1:23 PM
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
## 2016-02-17 LAB — TOXASSURE SELECT,+ANTIDEPR,UR

## 2016-02-19 ENCOUNTER — Encounter (HOSPITAL_COMMUNITY): Payer: Self-pay | Admitting: Psychology

## 2016-02-19 ENCOUNTER — Other Ambulatory Visit (HOSPITAL_COMMUNITY): Payer: Self-pay | Admitting: Psychology

## 2016-02-19 ENCOUNTER — Other Ambulatory Visit (HOSPITAL_COMMUNITY): Payer: Self-pay | Admitting: Medical

## 2016-02-19 DIAGNOSIS — F331 Major depressive disorder, recurrent, moderate: Secondary | ICD-10-CM

## 2016-02-19 DIAGNOSIS — F1420 Cocaine dependence, uncomplicated: Secondary | ICD-10-CM

## 2016-02-19 DIAGNOSIS — F1020 Alcohol dependence, uncomplicated: Secondary | ICD-10-CM

## 2016-02-20 ENCOUNTER — Encounter (HOSPITAL_COMMUNITY): Payer: Self-pay | Admitting: Psychology

## 2016-02-20 LAB — TOXASSURE SELECT,+ANTIDEPR,UR

## 2016-02-21 ENCOUNTER — Other Ambulatory Visit (HOSPITAL_COMMUNITY): Payer: Self-pay | Admitting: Medical

## 2016-02-21 ENCOUNTER — Encounter (HOSPITAL_COMMUNITY): Payer: Self-pay | Admitting: Medical

## 2016-02-21 ENCOUNTER — Encounter (HOSPITAL_COMMUNITY): Payer: Self-pay | Admitting: Psychology

## 2016-02-21 MED ORDER — BACLOFEN 20 MG PO TABS: 20.0000 mg | ORAL_TABLET | Freq: Three times a day (TID) | ORAL | 2 refills | Status: DC

## 2016-02-21 MED ORDER — VENLAFAXINE HCL ER 75 MG PO CP24: 75.0000 mg | ORAL_CAPSULE | Freq: Every day | ORAL | 2 refills | Status: DC

## 2016-02-21 MED ORDER — ARIPIPRAZOLE 2 MG PO TABS: ORAL_TABLET | ORAL | 2 refills | Status: DC

## 2016-02-21 NOTE — Progress Notes (Signed)
  [facility] Discharge Summary   [facility] [id number]  Date of Admission: 01/02/2016 Date of Discharge: 02/21/2016  Course of Treatment: Patient had an unsuccessful course in treatment despite being given extended treatments under behavioral con tract. Her UDS were consistently positive for alcohol,cocaine and marijuana despite her claims to the contrary and her signed behavioral contract. This is a matter of grave concern since pt reports she was was denied recommended, extended treatment by [employer] despite the appeals of her Physician and treatment team while inpatient at [facility] in 2016 by [employer].  [facility] treatment seeks to discover and address the core issues of its patients and "Leonides Sake" did get the benefit of the full course of treatment. She used within a week of discharge.She has been unable to remain abstinent since although she appears to be using the CDIOP to control her use at this point.We have recommended she return to a long term (90-180 days) inpt treatment facility for women. She has also been apprised of class action against [place] for denial of benefits in the hope that she could possibly get the treatment she was denied earlier?    During the course of treatment she appeared somewhat manic and screened positive for Bipolar DO and though initially fearful accepted prescription fo Abilify which she has found helpful.Her cocaine addiction may have masked her mania?   Goals and Activities to Help Maintain Sobriety: 1. Stay away from triggers including people places and things (old friends who continue to drink and use mind-altering chemicals.) that mean "go" to her addicted brain 2. Continue practicing Fair Fighting rules in interpersonal conflicts. 3. Continue alcohol and drug refusal skills and call on support systems. 4. Consider taking Baclofen daily to  diminish craving response to triggers 5. Read Adult Children of Alcoholics by [employer] and Perfect Daughters by Dia Sitter Referrals: Per Course in Treatment-   Aftercare services: NA 1. Attend AA/NA meetings 90 meetings in 90 days 2. Obtain a sponsor and a home group in [place] who will hold pt accountable. 3. Return to Psychotherapist as scheduled 4. 3 month supply of Abilify;Baclofen and Effexor sent to Pharmacy Next appointment: Pt says she has appt with new therapist scheduled  Plan of Action to Address Continuing Problems: As above    Client has NOT participated in the development of this discharge plan and has NOT received a copy of this completed plan She has met with her counselor who has provided her with the information in this summaryPrescritions for 3 month supply of medicatiions have been sent to her pharmacy  Darlyne Russian  02/21/2016   Darlyne Russian, PA-C 02/21/2016

## 2016-02-22 ENCOUNTER — Other Ambulatory Visit (HOSPITAL_COMMUNITY): Payer: Self-pay

## 2016-02-22 ENCOUNTER — Encounter (HOSPITAL_COMMUNITY): Payer: Self-pay | Admitting: Psychology

## 2016-02-22 LAB — TOXASSURE SELECT,+ANTIDEPR,UR
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
## 2016-02-23 ENCOUNTER — Telehealth (HOSPITAL_COMMUNITY): Payer: Self-pay | Admitting: Psychology

## 2016-02-23 LAB — TOXASSURE SELECT,+ANTIDEPR,UR

## 2016-02-23 NOTE — Progress Notes (Signed)
Kelly Mills is a 45 y.o. female patient. CD-IOP: This patient is officially discharged from the CD-IOP and referred to a Higher Level of Care. She was in the program as a recipient of the grant provided by the Consortium. This is per instructions by Beatriz Stallion regarding finding 2 indigent clients to attend the program. This patient was provided with one of those spots. Under the terms of this agreement, the patient's treatment was to be billed to the consortium in addition to all other charges, including drug tests.         [name], LCAS

## 2016-02-23 NOTE — Progress Notes (Signed)
Kelly Mills is a 45 y.o. female patient. CD-IOP: Positive Drug Test. Counselor received the results of a drug test collected from this patient back on Monday, October 2nd. It was positive for both cocaine and alcohol. Her sobriety date in group yesterday was still 9/20, but clearly she has continued to use, even after signing a behavioral contract. The patient is scheduled to meet with me later this morning. We will discuss this most recent result and explain to patient the reasons for our referral to a higher level of care.         [name], LCAS

## 2016-02-23 NOTE — Progress Notes (Signed)
Kelly Mills is a 45 y.o. female patient. CD-IOP: Counseling Session. Met with the patient and my co-therapist, WS this morning. She had been scheduleld to meet with me today, but that was before we had received the results of her most recent returned drug test, which was positive for cocaine and alcohol. The patient had signed a behavioral contract back on September 26th agreeing that any further use would results in discharge and referral to higher level of care. Today I handed the patient her drug test result and asked her to explain this. She admitted she had used cocaine that weekend and drank 2 mini-bottles of wine on Sunday. The patient reported this program has really helped her and she has reduced her drug use considerably since entering the program. The patient explained that she had intended to taper down and eventually be off of everything altogether. We reminded her that this was a program of total abstinence and tapering or harm reduction was not what we did here. The counselor wondered if this patient believed she could manage her use? The patient quickly pointed out she had only drunk 2 mini-bottles of wine and mixed them with soda. "I didn't intend to get drunk, but I just enjoy the taste". I reminded her that we know of no one who can manage their drug or alcohol use once they are dependent. She was only fooling herself to believe that she can manage her drinking. The patient was teary and asked that she be allowed to remain in the program. "it gives shape to my days", she reminded use. When the counselor mentioned seeking residential treatment, the patient insisted she would not go to rehab and just have to come up with a plan in order to remain sober without this program. She asked [place] again to please allow her to remain here, but she was asked how would things be different? The patient could not explain or describe how they would be different, but felt certain this would make her stop. We  agreed to talk with  Our program director and that I would phone her before the end of the day. The session ended with the patient very teary, but insisting she was fine and would not drink. She has an appointment with a 'mediation class' that is required for her and her ex-husband as they work to resolve and settle the financial and legal aspects of their recent divorce. We will speak with Harold Hedge and finalize the decision regarding this patient. However, it appears clear that this patient either can't or doesn't want to stop drinking and drugging.         [name], LCAS

## 2016-02-26 ENCOUNTER — Other Ambulatory Visit (HOSPITAL_COMMUNITY): Payer: Self-pay

## 2016-02-27 LAB — TOXASSURE SELECT,+ANTIDEPR,UR

## 2016-02-28 ENCOUNTER — Other Ambulatory Visit (HOSPITAL_COMMUNITY): Payer: Self-pay

## 2016-03-13 ENCOUNTER — Ambulatory Visit (INDEPENDENT_AMBULATORY_CARE_PROVIDER_SITE_OTHER): Payer: BLUE CROSS/BLUE SHIELD | Admitting: Clinical

## 2016-03-13 DIAGNOSIS — F331 Major depressive disorder, recurrent, moderate: Secondary | ICD-10-CM | POA: Diagnosis not present

## 2016-03-13 DIAGNOSIS — F1420 Cocaine dependence, uncomplicated: Secondary | ICD-10-CM

## 2016-03-13 DIAGNOSIS — F1020 Alcohol dependence, uncomplicated: Secondary | ICD-10-CM
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
## 2016-03-19 ENCOUNTER — Encounter (HOSPITAL_COMMUNITY): Payer: Self-pay | Admitting: Clinical

## 2016-03-21 ENCOUNTER — Ambulatory Visit (INDEPENDENT_AMBULATORY_CARE_PROVIDER_SITE_OTHER): Payer: BLUE CROSS/BLUE SHIELD | Admitting: Clinical

## 2016-03-21 DIAGNOSIS — F331 Major depressive disorder, recurrent, moderate: Secondary | ICD-10-CM | POA: Diagnosis not present

## 2016-03-21 DIAGNOSIS — F1020 Alcohol dependence, uncomplicated: Secondary | ICD-10-CM

## 2016-03-21 DIAGNOSIS — F1420 Cocaine dependence, uncomplicated: Secondary | ICD-10-CM | POA: Diagnosis not present

## 2016-03-21 NOTE — Progress Notes (Signed)
   THERAPIST PROGRESS NOTE  Session Time: 10:08 - 10:59  Participation Level: Active  Behavioral Response: CasualAlertDepressed  Type of Therapy: Individual Therapy  Treatment Goals addressed: improve psychiatric symptoms, relapse prevention skills, improve unhelpful thought patterns,  emotional regulation skills (stress management),  healthy coping skills    Summary: Kelly Mills is a 45 y.o. female who presents with Alcohol use disorder, severe, dependence, and Cocaine use disorder, severe, dependence, and  Major Depressive disorder, recurrent, moderate   Interventions: CBT and Motivational Interviewing, Grounding and Mindfulness Techniques, psychoeducation   Suicidal/Homicidal: No -without intent/plan  Therapist Response:  Kelly Mills met with clinician for an individual session. Kelly Mills discussed her psychiatric symptoms, her current life events, and her goals for therapy. Kelly Mills shared that she has had a very stressful time since her last appointment. She shared that she had gone to court for her DUI. She shared this was stressful but that she is accepting of the punishment she will receive. She shared that she also went to court for mediation for her children. This is more stressful for her as her ex husband is making it more difficult for her to have relationships with her daughters. Kelly Mills stated that she had not used drugs or alcohol since last session. Kelly Mills and clinician discussed her recovery process. Kelly Mills and clinician discussed stress and it's relationship to substance use. Clinician introduced grounding and mindfulness techniques. Clinician explained the process, purpose, and practice of the techniques. Kelly Mills and clinician practiced the techniques together.  Kelly Mills agreed to practice the techniques daily until next session.   Plan: Return again in 1-2 weeks.  Diagnosis: Axis I: Alcohol use disorder, severe, dependence, and Cocaine use disorder, severe, dependence,  and Major Depressive disorder, recurrent, moderate    Levante Simones A, LCSW 03/21/2016

## 2016-03-28 ENCOUNTER — Encounter (HOSPITAL_COMMUNITY): Payer: Self-pay | Admitting: Clinical

## 2016-04-02 ENCOUNTER — Ambulatory Visit (HOSPITAL_COMMUNITY): Payer: Self-pay | Admitting: Clinical

## 2016-04-16 ENCOUNTER — Ambulatory Visit (HOSPITAL_COMMUNITY): Payer: Self-pay | Admitting: Clinical

## 2016-04-30 ENCOUNTER — Ambulatory Visit (INDEPENDENT_AMBULATORY_CARE_PROVIDER_SITE_OTHER): Payer: BLUE CROSS/BLUE SHIELD | Admitting: Clinical

## 2016-04-30 DIAGNOSIS — F1420 Cocaine dependence, uncomplicated: Secondary | ICD-10-CM | POA: Diagnosis not present

## 2016-04-30 DIAGNOSIS — F331 Major depressive disorder, recurrent, moderate: Secondary | ICD-10-CM | POA: Diagnosis not present

## 2016-04-30 DIAGNOSIS — F1020 Alcohol dependence, uncomplicated: Secondary | ICD-10-CM
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
## 2016-05-14 ENCOUNTER — Ambulatory Visit (HOSPITAL_COMMUNITY): Payer: Self-pay | Admitting: Clinical

## 2016-05-29 ENCOUNTER — Ambulatory Visit (HOSPITAL_COMMUNITY): Payer: Self-pay | Admitting: Clinical

## 2016-06-12 ENCOUNTER — Ambulatory Visit (HOSPITAL_COMMUNITY): Payer: Self-pay | Admitting: Clinical

## 2016-06-20 ENCOUNTER — Ambulatory Visit (HOSPITAL_COMMUNITY): Payer: Self-pay | Admitting: Clinical

## 2016-07-04 ENCOUNTER — Ambulatory Visit (HOSPITAL_COMMUNITY): Payer: Self-pay | Admitting: Clinical

## 2016-07-16 ENCOUNTER — Ambulatory Visit (HOSPITAL_COMMUNITY): Payer: Self-pay | Admitting: Clinical

## 2016-10-18 ENCOUNTER — Other Ambulatory Visit: Payer: Self-pay | Admitting: *Deleted

## 2016-10-18 DIAGNOSIS — C50411 Malignant neoplasm of upper-outer quadrant of right female breast: Secondary | ICD-10-CM

## 2016-10-18 DIAGNOSIS — Z170 Estrogen receptor positive status [ER+]: Secondary | ICD-10-CM

## 2016-10-21 ENCOUNTER — Other Ambulatory Visit (HOSPITAL_BASED_OUTPATIENT_CLINIC_OR_DEPARTMENT_OTHER): Payer: Self-pay

## 2016-10-21 ENCOUNTER — Ambulatory Visit (HOSPITAL_BASED_OUTPATIENT_CLINIC_OR_DEPARTMENT_OTHER): Payer: Self-pay | Admitting: Oncology

## 2016-10-21 VITALS — BP 156/86 | HR 100 | Temp 98.0°F | Resp 18 | Ht 67.0 in | Wt 179.9 lb

## 2016-10-21 DIAGNOSIS — Z79811 Long term (current) use of aromatase inhibitors: Secondary | ICD-10-CM

## 2016-10-21 DIAGNOSIS — F1020 Alcohol dependence, uncomplicated: Secondary | ICD-10-CM

## 2016-10-21 DIAGNOSIS — C50411 Malignant neoplasm of upper-outer quadrant of right female breast: Secondary | ICD-10-CM

## 2016-10-21 DIAGNOSIS — Z170 Estrogen receptor positive status [ER+]: Secondary | ICD-10-CM

## 2016-10-21 DIAGNOSIS — Z923 Personal history of irradiation: Secondary | ICD-10-CM

## 2016-10-21 DIAGNOSIS — C773 Secondary and unspecified malignant neoplasm of axilla and upper limb lymph nodes: Secondary | ICD-10-CM

## 2016-10-21 DIAGNOSIS — F1021 Alcohol dependence, in remission: Secondary | ICD-10-CM

## 2016-10-21 LAB — CBC WITH DIFFERENTIAL/PLATELET
BASO%: 0.8 % (ref 0.0–2.0)
Basophils Absolute: 0.1 10*3/uL (ref 0.0–0.1)
EOS ABS: 0.4 10*3/uL (ref 0.0–0.5)
EOS%: 4.3 % (ref 0.0–7.0)
HCT: 40.9 % (ref 34.8–46.6)
HGB: 13.7 g/dL (ref 11.6–15.9)
LYMPH%: 23.7 % (ref 14.0–49.7)
MCH: 32 pg (ref 25.1–34.0)
MCHC: 33.5 g/dL (ref 31.5–36.0)
MCV: 95.7 fL (ref 79.5–101.0)
MONO#: 0.7 10*3/uL (ref 0.1–0.9)
MONO%: 7 % (ref 0.0–14.0)
NEUT#: 6.2 10*3/uL (ref 1.5–6.5)
NEUT%: 64.2 % (ref 38.4–76.8)
Platelets: 258 10*3/uL (ref 145–400)
RBC: 4.28 10*6/uL (ref 3.70–5.45)
RDW: 13 % (ref 11.2–14.5)
WBC: 9.6 10*3/uL (ref 3.9–10.3)
lymph#: 2.3 10*3/uL (ref 0.9–3.3)

## 2016-10-21 LAB — COMPREHENSIVE METABOLIC PANEL
ALBUMIN: 4 g/dL (ref 3.5–5.0)
ALK PHOS: 78 U/L (ref 40–150)
ALT: 22 U/L (ref 0–55)
AST: 20 U/L (ref 5–34)
Anion Gap: 11 mEq/L (ref 3–11)
BUN: 11.1 mg/dL (ref 7.0–26.0)
CHLORIDE: 102 meq/L (ref 98–109)
CO2: 24 mEq/L (ref 22–29)
Calcium: 9.3 mg/dL (ref 8.4–10.4)
Creatinine: 0.8 mg/dL (ref 0.6–1.1)
Glucose: 96 mg/dl (ref 70–140)
POTASSIUM: 4 meq/L (ref 3.5–5.1)
Sodium: 137 mEq/L (ref 136–145)
Total Bilirubin: 0.25 mg/dL (ref 0.20–1.20)
Total Protein: 7.4 g/dL (ref 6.4–8.3)

## 2016-10-21 MED ORDER — TAMOXIFEN CITRATE 20 MG PO TABS: 20.0000 mg | ORAL_TABLET | Freq: Every day | ORAL | 2 refills | Status: DC

## 2016-10-21 NOTE — Progress Notes (Signed)
[place]  Telephone:([phone] [phone]  OFFICE PROGRESS NOTE   ID: Kelly Mills   DOB: 30-Aug-1970  MR#: [id number]  IAX#:[id number]   PCP: No primary care provider on file. SU: Rolm Bookbinder, MD OTHER MD: Arloa Koh, MD  CHIEF COMPLAINT: left breast cancer  CURRENT TREATMENT: tamoxifen daily  BREAST CANCER HISTORY: From Dr. Collier Salina Rubin's new patient evaluation dated 07/17/2011:  "The patient has been in good health. She has had previous mammograms. Last mammogram was in May 2012. She self detected a mass in her left breast in January of this year. She sought medical attention for this and was referred for mammogram and ultrasound. This took place on 07/03/2011. At that time a firm palpable mass was noted at 10:00 position. This measured 1.8 x 1.7 x 1.6 cm. Prominent lymph node was seen in the axilla. Biopsies of both areas were was performed. Both the breast and lymph node showed invasive mammary carcinoma he ER status was 98% PR +80%, proliferative index 84% HER-2 was negative with a ratio 0.96 MRI scan of both breasts performed 07/13/2011 showed a mass measuring 3.3 x 2.3 x 2 cm. In addition there was an enhancing nodule located 8 mm posterior inferior to the larger mass measuring 1.1 x 0.8 x 0.9 cm. In aggregate this total area measured 4.2 cm."  Her subsequent history is as detailed below.     INTERVAL HISTORY: Kelly Mills returns today for follow-up of her estrogen receptor positive breast cancer. She continues on tamoxifen, with excellent tolerance. She has essentially no side effects from this medication that she is aware of. She is still having periods although irregularly. She obtains the tamoxifen at very little cost.   REVIEW OF SYSTEMS: Kelly Mills's divorce was finalized January 2018, but there are still many issues regarding equity in process. They have joint custody, 2 daughters being 1 week with each pallor. This causes a great deal of  stress. Kelly Mills is working part-time, finding it difficult to get a full-time job which would be her preference. She continues to be very active in [place] and speaks to her sponsor daily. She is doing "fine" as far as abstinence is concerned. She had a foot injury which limits her ability to walk long distances. A detailed review of systems today was otherwise stable.  PAST MEDICAL HISTORY: Past Medical History:  Diagnosis Date  . Breast cancer ([place])    right  . ETOH abuse   . History of chemotherapy    completed 11/13/11  . History of radiation therapy 01/06/12-02/25/12   right breast,5040 cgy/28 sessions, boost 1440 cGy/8 sessions  . Insomnia     PAST SURGICAL HISTORY: Past Surgical History:  Procedure Laterality Date  . AXILLARY LYMPH NODE DISSECTION  12/04/2011   Procedure: AXILLARY LYMPH NODE DISSECTION;  Surgeon: Rolm Bookbinder, MD;  Location: [facility];  Service: General;  Laterality: Right;  . BREAST LUMPECTOMY  12/04/11   right, ER/PR +, HER 2 -  . NASAL SEPTUM SURGERY    . PORT-A-CATH REMOVAL  12/04/2011   Procedure: REMOVAL PORT-A-CATH;  Surgeon: Rolm Bookbinder, MD;  Location: [facility];  Service: General;  Laterality: Left;  . PORTACATH PLACEMENT  07/22/2011   Procedure: INSERTION PORT-A-CATH;  Surgeon: Rolm Bookbinder, MD;  Location: [facility];  Service: General;  Laterality: N/A;  Insertion of Port-A-Cath    FAMILY HISTORY Family History  Problem Relation Age of Onset  . Cancer Maternal Grandfather  melanoma  . Diabetes Paternal Grandfather   . Arthritis Mother   . Hypertension Father   . Psoriasis Brother   . Anesthesia problems Neg Hx     GYNECOLOGIC HISTORY: G5P2.  Hx of frequent clomid injections and other fertility drugs over 2 yrs.  has had very intermittent periods in the last year and a half. She has a copper IUD in place  SOCIAL HISTORY: (Updated June 2018 Kelly Mills and her husband are divorced, but both reside in  [place], [place].  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 He works in Engineer, technical sales.  They have 2 daughters aged 4 and 38.  ADVANCED DIRECTIVES: Not on file; at the 07/26/2014 visit the patient was given the appropriate documents 2 complete and notarize at her discretion  HEALTH MAINTENANCE: Social History  Substance Use Topics  . Smoking status: Current Some Day Smoker  . Smokeless tobacco: Never Used  . Alcohol use Yes    Colonoscopy: N/A PAP: Not on file Bone density: Not on file Lipid panel: Not on file  Allergies  Allergen Reactions  . Codeine Nausea Only    Current Outpatient Prescriptions  Medication Sig Dispense Refill  . ibuprofen (ADVIL,MOTRIN) 200 MG tablet Take 200 mg by mouth every 6 (six) hours as needed.    . Multiple Vitamin (MULTIVITAMIN) capsule Take 1 capsule by mouth daily.    . tamoxifen (NOLVADEX) 20 MG tablet Take 1 tablet (20 mg total) by mouth daily. 30 tablet 2   No current facility-administered medications for this visit.     OBJECTIVE: Middle-aged white woman Who appears stated age  46:   10/21/16 1437  BP: (!) 156/86  Pulse: 100  Resp: 18  Temp: 98 F (36.7 C)     Body mass index is 28.18 kg/m.      ECOG FS:  0  Sclerae unicteric, EOMs intact Oropharynx clear and moist No cervical or supraclavicular adenopathy Lungs no rales or rhonchi Heart regular rate and rhythm Abd soft, nontender, positive bowel sounds MSK no focal spinal tenderness, no upper extremity lymphedema Neuro: nonfocal, well oriented, appropriate affect Breasts: The right breast as undergone lumpectomy and radiation with no evidence of local recurrence. Left breast is unremarkable. Both axillae are benign.  LAB RESULTS: Lab Results  Component Value Date   WBC 9.6 10/21/2016   NEUTROABS 6.2 10/21/2016   HGB 13.7 10/21/2016   HCT 40.9 10/21/2016   MCV 95.7 10/21/2016   PLT 258 10/21/2016       Chemistry      Component Value Date/Time   NA 137 10/21/2016 1421   K 4.0 10/21/2016 1421   CL 106 12/31/2015 1600   CL 101 08/18/2012 1059   CO2 24 10/21/2016 1421   BUN 11.1 10/21/2016 1421   CREATININE 0.8 10/21/2016 1421      Component Value Date/Time   CALCIUM 9.3 10/21/2016 1421   ALKPHOS 78 10/21/2016 1421   AST 20 10/21/2016 1421   ALT 22 10/21/2016 1421   BILITOT 0.25 10/21/2016 1421      Lab Results  Component Value Date   LABCA2 16 04/10/2012    Urinalysis No results found for: COLORURINE  STUDIES: She is currently a bit behind on her mammography.  ASSESSMENT: 46 y.o. BRCA negative [place]:  1.  Status post right breast Upper outer quadrant needle core biopsy at the 9 o'clock position on 07/08/2011 which showed invasive mammary carcinoma, ER 98%, PR  80%, Ki-67 84%, HER-2/neu negative.  She also had a right needle core biopsy of right axilla which was positive for metastatic mammary carcinoma.  2.  Status post bilateral breast MRI on 07/13/2011 which showed an irregular enhancing mass located laterally within the posterior third of the right breast at the 9 o'clock position with central clip artifact consistent with the diagnosed invasive mammary carcinoma. The mass measured 3.3 x 2.3 x 2.0 cm in size. There was an enhancing 1.1 x 0.8 x 0.9 cm satellite nodule located 8 mm posterior and inferior to the larger mass. In aggregate the larger mass and the satellite nodule measure 4.2 cm in greatest dimension. There were no additional or worrisome enhancing lesions within the right breast or within the left breast.  Two adjacent abnormal appearing level I right axillary lymph nodes worrisome for metastatic lymph nodes.  3.  Status post right needle core biopsy on 07/19/2011 at the 7 o'clock position which showed invasive mammary carcinoma ER 98%, PR 26%, Ki-67 83%, HER-2/neu negative.  4.  A genetic counseling and testing letter dated 07/26/2011 showed the patient  was negative for the BRCA1 and BRCA2 gene mutations.  5.  Status post neoadjuvant chemotherapy with FEC x4 cycles with Neulasta support from 08/07/2011 through 09/18/2011.  This was followed by neoadjuvant Taxotere x4 cycles with Neulasta support from 10/03/2011 through 11/13/2011.  6.  Status post right breast lumpectomy with regional node resection of right axillary contents on 12/04/2011 for a stage IIB, ypT1c, pN1a, invasive ductal carcinoma, grade 1, 3/8 lymph nodes positive for metastatic mammary carcinoma (264m, 333m and 64m20mwith extracapsular tumor extension identified, prognostic profile was not repeated.  There was insufficient invasive carcinoma present for HER-2 analysis.  7.  Status post radiation therapy from 01/06/2012 through 02/25/2012..  8.  Tamoxifen started in December 2013.  9. Alcohol abuse - currently sober past 4 months plus  PLAN: Shawnay is now nearly 5 years out from definitive surgery for her breast cancer with no evidence of disease recurrence. This is very favorable.  She started tamoxifen in December 2013. The plan has been to continue that a minimum of 5 years which means she could conceivably complete treatment later this year.  We discussed the recent data that 10 years of tamoxifen is superior to 5 and in patients like her, with positive lymph nodes after adjuvant chemotherapy, that would be a prudent course. She is very much in favor of continuing tamoxifen for a total of 10 years.  I went ahead and place orders for her mammogram to be done this month.  She will see me again in one year. She knows to call for any problems that may develop before that visit.  MAGChauncey CruelD  10/21/2016, 7:52 PM

## 2016-10-23 ENCOUNTER — Other Ambulatory Visit: Payer: Self-pay | Admitting: Oncology

## 2016-10-23 ENCOUNTER — Ambulatory Visit
Admission: RE | Admit: 2016-10-23 | Discharge: 2016-10-23 | Disposition: A | Discharge status: Home / Self Care | Payer: BLUE CROSS/BLUE SHIELD | Source: Ambulatory Visit | Attending: Oncology | Admitting: Oncology

## 2016-10-23 DIAGNOSIS — N6310 Unspecified lump in the right breast, unspecified quadrant: Secondary | ICD-10-CM

## 2016-10-23 DIAGNOSIS — Z170 Estrogen receptor positive status [ER+]: Secondary | ICD-10-CM

## 2016-10-23 DIAGNOSIS — C50411 Malignant neoplasm of upper-outer quadrant of right female breast: Secondary | ICD-10-CM

## 2016-10-23 HISTORY — DX: Personal history of antineoplastic chemotherapy: Z92.21

## 2016-10-23 HISTORY — DX: Personal history of irradiation: Z92.3

## 2017-02-06 ENCOUNTER — Other Ambulatory Visit: Payer: Self-pay

## 2017-02-13 ENCOUNTER — Ambulatory Visit: Payer: Self-pay | Admitting: Oncology

## 2017-04-23 ENCOUNTER — Ambulatory Visit: Payer: BLUE CROSS/BLUE SHIELD | Admitting: Neurology

## 2017-04-24 ENCOUNTER — Ambulatory Visit: Payer: Self-pay | Admitting: Neurology

## 2017-07-15 ENCOUNTER — Other Ambulatory Visit: Payer: Self-pay | Admitting: Oncology

## 2017-07-15 DIAGNOSIS — C50411 Malignant neoplasm of upper-outer quadrant of right female breast: Secondary | ICD-10-CM

## 2017-07-15 DIAGNOSIS — Z170 Estrogen receptor positive status [ER+]: Secondary | ICD-10-CM

## 2017-10-07 ENCOUNTER — Other Ambulatory Visit: Payer: Self-pay | Admitting: Oncology

## 2017-10-07 ENCOUNTER — Other Ambulatory Visit: Payer: Self-pay | Admitting: *Deleted

## 2017-10-07 ENCOUNTER — Telehealth: Payer: Self-pay | Admitting: *Deleted

## 2017-10-07 DIAGNOSIS — Z170 Estrogen receptor positive status [ER+]: Secondary | ICD-10-CM

## 2017-10-07 DIAGNOSIS — N6459 Other signs and symptoms in breast: Secondary | ICD-10-CM

## 2017-10-07 DIAGNOSIS — Z1231 Encounter for screening mammogram for malignant neoplasm of breast: Secondary | ICD-10-CM

## 2017-10-07 DIAGNOSIS — C50411 Malignant neoplasm of upper-outer quadrant of right female breast: Secondary | ICD-10-CM

## 2017-10-07 NOTE — Telephone Encounter (Signed)
Pt called to this RN to state she " feels like the area where they removed the lymph nodes is fuller " .  Kelly Mills states above has been ongoing " and I have monitored it and it does feel different "  She denies any swelling in her arm.  She is due for her mammogram - which now that she is 5 years out would be a screening only - per discussion plan is to request a diagnostic mammo with [place] to assess the area appropriately.

## 2017-10-27 ENCOUNTER — Other Ambulatory Visit: Payer: Self-pay | Admitting: Oncology

## 2017-10-27 DIAGNOSIS — Z170 Estrogen receptor positive status [ER+]: Secondary | ICD-10-CM

## 2017-10-27 DIAGNOSIS — C50411 Malignant neoplasm of upper-outer quadrant of right female breast: Secondary | ICD-10-CM

## 2017-10-28 ENCOUNTER — Ambulatory Visit
Admission: RE | Admit: 2017-10-28 | Discharge: 2017-10-28 | Disposition: A | Discharge status: Home / Self Care | Payer: BLUE CROSS/BLUE SHIELD | Source: Ambulatory Visit | Attending: Oncology | Admitting: Oncology

## 2017-10-28 ENCOUNTER — Other Ambulatory Visit: Payer: Self-pay | Admitting: Oncology

## 2017-10-28 ENCOUNTER — Ambulatory Visit: Payer: Self-pay

## 2017-10-28 DIAGNOSIS — C50411 Malignant neoplasm of upper-outer quadrant of right female breast: Secondary | ICD-10-CM

## 2017-10-28 DIAGNOSIS — N6459 Other signs and symptoms in breast: Secondary | ICD-10-CM

## 2017-10-28 DIAGNOSIS — Z170 Estrogen receptor positive status [ER+]: Secondary | ICD-10-CM

## 2017-11-03 ENCOUNTER — Other Ambulatory Visit: Payer: Self-pay

## 2017-11-03 ENCOUNTER — Ambulatory Visit: Payer: Self-pay | Admitting: Oncology

## 2017-11-03 DIAGNOSIS — Z170 Estrogen receptor positive status [ER+]: Secondary | ICD-10-CM

## 2017-11-03 DIAGNOSIS — C50411 Malignant neoplasm of upper-outer quadrant of right female breast: Secondary | ICD-10-CM

## 2017-11-03 NOTE — Progress Notes (Signed)
[place]  Telephone:(336) [phone] Fax:(336) [phone]  OFFICE PROGRESS NOTE   ID: Kelly Mills   DOB: Jul 22, 1970  MR#: [id number]  KNL#:[id number]   PCP: Patient, No Pcp Per SU: Kelly Bookbinder, MD OTHER MD: Kelly Koh, MD   CHIEF COMPLAINT: left breast cancer  CURRENT TREATMENT: tamoxifen  BREAST CANCER HISTORY: From Dr. Collier Salina Mills's new patient evaluation dated 07/17/2011:  "The patient has been in good health. She has had previous mammograms. Last mammogram was in May 2012. She self detected a mass in her left breast in January of this year. She sought medical attention for this and was referred for mammogram and ultrasound. This took place on 07/03/2011. At that time a firm palpable mass was noted at 10:00 position. This measured 1.8 x 1.7 x 1.6 cm. Prominent lymph node was seen in the axilla. Biopsies of both areas were was performed. Both the breast and lymph node showed invasive mammary carcinoma he ER status was 98% PR +80%, proliferative index 84% HER-2 was negative with a ratio 0.96 MRI scan of both breasts performed 07/13/2011 showed a mass measuring 3.3 x 2.3 x 2 cm. In addition there was an enhancing nodule located 8 mm posterior inferior to the larger mass measuring 1.1 x 0.8 x 0.9 cm. In aggregate this total area measured 4.2 cm."  Her subsequent history is as detailed below.     INTERVAL HISTORY: Kelly Mills returns today for follow-up of her estrogen receptor positive breast cancer. She continues on tamoxifen, with good tolerance. She denies issues with hot flashes or increased vaginal wetness. She is still having periods. Some of her periods are light or spotty.   Since her last visit, she underwent diagnostic bilateral mammography with CAD and tomography and right axillary ultrasonography on 10/28/2017 at [facility] showing: breast density category C. There was no evidence of malignancy in the breasts.  There was no abnormalities in the right  axilla.    REVIEW OF SYSTEMS: Kelly Mills reports that her youngest daughter is living with her. Her oldest daughter lives with her father.  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 She is dealing with some behavioral issues with her younger daughter. She is listening to parenting advice from "Conscious Parenting" by Blair Hailey, based on her daughter's behavior. She denies unusual headaches, visual changes, nausea, vomiting, or dizziness. There has been no unusual cough, phlegm production, or pleurisy. This been no change in bowel or bladder habits. She denies unexplained fatigue or unexplained weight loss, bleeding, rash, or fever. A detailed review of systems was otherwise stable.    PAST MEDICAL HISTORY: Past Medical History:  Diagnosis Date  . Breast cancer ([place])    right  . ETOH abuse   . History of chemotherapy    completed 11/13/11  . History of radiation therapy 01/06/12-02/25/12   right breast,5040 cgy/28 sessions, boost 1440 cGy/8 sessions  . Insomnia   . Personal history of chemotherapy   . Personal history of radiation therapy     PAST SURGICAL HISTORY: Past Surgical History:  Procedure Laterality Date  . AXILLARY LYMPH NODE DISSECTION  12/04/2011   Procedure: AXILLARY LYMPH NODE DISSECTION;  Surgeon: Kelly Bookbinder, MD;  Location: [facility];  Service: General;  Laterality: Right;  . BREAST LUMPECTOMY Right 12/04/11   right, ER/PR +, HER 2 -  . NASAL SEPTUM SURGERY    . PORT-A-CATH REMOVAL  12/04/2011  Procedure: REMOVAL PORT-A-CATH;  Surgeon: Kelly Bookbinder, MD;  Location: [facility];  Service: General;  Laterality: Left;  . PORTACATH PLACEMENT  07/22/2011   Procedure: INSERTION PORT-A-CATH;  Surgeon: Kelly Bookbinder, MD;  Location: [facility] OR;  Service: General;  Laterality: N/A;  Insertion of Port-A-Cath    FAMILY HISTORY Family History  Problem Relation  Age of Onset  . Cancer Maternal Grandfather        melanoma  . Diabetes Paternal Grandfather   . Arthritis Mother   . Hypertension Father   . Psoriasis Brother   . Anesthesia problems Neg Hx     GYNECOLOGIC HISTORY: G5P2.  Hx of frequent clomid injections and other fertility drugs over 2 yrs.  has had very intermittent periods in the last year and a half. She has a copper IUD in place  SOCIAL HISTORY: (Updated June 2019) [name] and her husband are divorced, but both reside in [place], [place].  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 He works in Engineer, technical sales.  They have 2 daughters aged 59 and 61.   ADVANCED DIRECTIVES: Not on file; at the 07/26/2014 visit the patient was given the appropriate documents 2 complete and notarize at her discretion  HEALTH MAINTENANCE: Social History   Tobacco Use  . Smoking status: Current Some Day Smoker  . Smokeless tobacco: Never Used  Substance Use Topics  . Alcohol use: Yes  . Drug use: Yes    Frequency: 4.0 times per week    Types: Cocaine    Comment: Weekly use    Colonoscopy: N/A PAP: Not on file Bone density: Not on file Lipid panel: Not on file  Allergies  Allergen Reactions  . Codeine Nausea Only    Current Outpatient Medications  Medication Sig Dispense Refill  . ibuprofen (ADVIL,MOTRIN) 200 MG tablet Take 200 mg by mouth every 6 (six) hours as needed.    . Multiple Vitamin (MULTIVITAMIN) capsule Take 1 capsule by mouth daily.    . tamoxifen (NOLVADEX) 20 MG tablet TAKE 1 TABLET BY MOUTH EVERY DAY 30 tablet 0   No current facility-administered medications for this visit.     OBJECTIVE: Middle-aged white woman in no acute distress  Vitals:   11/04/17 1219  BP: (!) 145/93  Pulse: 83  Resp: 18  Temp: 98.4 F (36.9 C)  SpO2: 100%     Body mass index is 29.49 kg/m.      ECOG FS:  0  Sclerae unicteric, pupils round and equal Oropharynx clear and  moist No cervical or supraclavicular adenopathy Lungs no rales or rhonchi Heart regular rate and rhythm Abd soft, nontender, positive bowel sounds MSK no focal spinal tenderness, no upper extremity lymphedema Neuro: nonfocal, well oriented, appropriate affect Breasts: Right breast is status post lumpectomy and radiation.  There is some irregularity on the right lateral area, but nothing suggestive of disease recurrence.  The left breast is benign.  Both axillae are benign.  LAB RESULTS: Lab Results  Component Value Date   WBC 8.2 11/04/2017   NEUTROABS 5.2 11/04/2017   HGB 12.5 11/04/2017   HCT 37.2 11/04/2017   MCV 94.0 11/04/2017   PLT 229 11/04/2017      Chemistry      Component Value Date/Time   NA 137 10/21/2016 1421   K 4.0 10/21/2016 1421   CL 106 12/31/2015 1600   CL 101 08/18/2012 1059   CO2 24 10/21/2016 1421  BUN 11.1 10/21/2016 1421   CREATININE 0.8 10/21/2016 1421      Component Value Date/Time   CALCIUM 9.3 10/21/2016 1421   ALKPHOS 78 10/21/2016 1421   AST 20 10/21/2016 1421   ALT 22 10/21/2016 1421   BILITOT 0.25 10/21/2016 1421      Lab Results  Component Value Date   LABCA2 16 04/10/2012    Urinalysis No results found for: COLORURINE  STUDIES: Mm Diag Breast Tomo Bilateral  Result Date: 10/28/2017 CLINICAL DATA:  47 year old female with history of right breast cancer status post lumpectomy and right axillary lymph node dissection in 2013, presenting for evaluation of fullness in the right axilla. EXAM: DIGITAL DIAGNOSTIC BILATERAL MAMMOGRAM WITH CAD AND TOMO RIGHT AXILLARY ULTRASOUND COMPARISON:  Previous exam(s). ACR Breast Density Category c: The breast tissue is heterogeneously dense, which may obscure small masses. FINDINGS: The right breast lumpectomy site is stable. No suspicious calcifications, masses or areas of distortion are seen in the bilateral breasts. Mammographic images were processed with CAD. Physical exam of the upper-outer  quadrant of the right breast demonstrates firm nodular tissue between the lumpectomy scar and the axillary dissection surgical scar in the low axilla into the upper-outer right breast. Ultrasound of this area in the low axilla to upper-outer right breast demonstrates normal fibroglandular tissue. No suspicious masses or areas of shadowing are identified. IMPRESSION: 1. There are no mammographic or targeted sonographic abnormalities in the low axilla/upper-outer quadrant of the right breast at the palpable site of concern. 2.  No mammographic evidence of malignancy in the bilateral breasts. RECOMMENDATION: 1. Clinical follow-up recommended for the area of fullness in the right axilla. Any further workup should be based on clinical grounds. If there is further clinical concern, consider MRI for further evaluation. 2.  Screening mammogram in one year.(Code:SM-B-01Y) I have discussed the findings and recommendations with the patient. Results were also provided in writing at the conclusion of the visit. If applicable, a reminder letter will be sent to the patient regarding the next appointment. BI-RADS CATEGORY  2: Benign. Electronically Signed   By: Ammie Ferrier M.D.   On: 10/28/2017 16:26   [place] Axilla Right  Result Date: 10/28/2017 CLINICAL DATA:  47 year old female with history of right breast cancer status post lumpectomy and right axillary lymph node dissection in 2013, presenting for evaluation of fullness in the right axilla. EXAM: DIGITAL DIAGNOSTIC BILATERAL MAMMOGRAM WITH CAD AND TOMO RIGHT AXILLARY ULTRASOUND COMPARISON:  Previous exam(s). ACR Breast Density Category c: The breast tissue is heterogeneously dense, which may obscure small masses. FINDINGS: The right breast lumpectomy site is stable. No suspicious calcifications, masses or areas of distortion are seen in the bilateral breasts. Mammographic images were processed with CAD. Physical exam of the upper-outer quadrant of the right breast  demonstrates firm nodular tissue between the lumpectomy scar and the axillary dissection surgical scar in the low axilla into the upper-outer right breast. Ultrasound of this area in the low axilla to upper-outer right breast demonstrates normal fibroglandular tissue. No suspicious masses or areas of shadowing are identified. IMPRESSION: 1. There are no mammographic or targeted sonographic abnormalities in the low axilla/upper-outer quadrant of the right breast at the palpable site of concern. 2.  No mammographic evidence of malignancy in the bilateral breasts. RECOMMENDATION: 1. Clinical follow-up recommended for the area of fullness in the right axilla. Any further workup should be based on clinical grounds. If there is further clinical concern, consider MRI for further evaluation. 2.  Screening mammogram  in one year.(Code:SM-B-01Y) I have discussed the findings and recommendations with the patient. Results were also provided in writing at the conclusion of the visit. If applicable, a reminder letter will be sent to the patient regarding the next appointment. BI-RADS CATEGORY  2: Benign. Electronically Signed   By: Ammie Ferrier M.D.   On: 10/28/2017 16:26     ASSESSMENT: 47 y.o. BRCA negative [place] woman:  1.  Status post right breast Upper outer quadrant needle core biopsy at the 9 o'clock position on 07/08/2011 which showed invasive mammary carcinoma, ER 98%, PR 80%, Ki-67 84%, HER-2/neu negative.  She also had a right needle core biopsy of right axilla which was positive for metastatic mammary carcinoma.  2.  Status post bilateral breast MRI on 07/13/2011 which showed an irregular enhancing mass located laterally within the posterior third of the right breast at the 9 o'clock position with central clip artifact consistent with the diagnosed invasive mammary carcinoma. The mass measured 3.3 x 2.3 x 2.0 cm in size. There was an enhancing 1.1 x 0.8 x 0.9 cm satellite nodule located 8 mm posterior  and inferior to the larger mass. In aggregate the larger mass and the satellite nodule measure 4.2 cm in greatest dimension. There were no additional or worrisome enhancing lesions within the right breast or within the left breast.  Two adjacent abnormal appearing level I right axillary lymph nodes worrisome for metastatic lymph nodes.  3.  Status post right needle core biopsy on 07/19/2011 at the 7 o'clock position which showed invasive mammary carcinoma ER 98%, PR 26%, Ki-67 83%, HER-2/neu negative.  4.  A genetic counseling and testing letter dated 07/26/2011 showed the patient was negative for the BRCA1 and BRCA2 gene mutations.  5.  Status post neoadjuvant chemotherapy with FEC x4 cycles with Neulasta support from 08/07/2011 through 09/18/2011.  This was followed by neoadjuvant Taxotere x4 cycles with Neulasta support from 10/03/2011 through 11/13/2011.  6.  Status post right breast lumpectomy with regional node resection of right axillary contents on 12/04/2011 for a stage IIB, ypT1c, pN1a, invasive ductal carcinoma, grade 1, 3/8 lymph nodes positive for metastatic mammary carcinoma (37m, 399m and 76m376mwith extracapsular tumor extension identified, prognostic profile was not repeated.  There was insufficient invasive carcinoma present for HER-2 analysis.  7.  Status post radiation therapy from 01/06/2012 through 02/25/2012..  8.  Tamoxifen started in December 2013.    PLAN: AdrEmalina now just about 6 years out from definitive surgery for her breast cancer with no evidence of disease recurrence.  This is very favorable.  We have discussed continuing tamoxifen for a total of 10 years, which I think is very reasonable in her case.  She is happy with this choice.  She has a copper IUD in place.  She still has periods perhaps 4 or 5 times a year.  This is actually good in the sense that every time she she had some lining of the uterus we have less concern regarding polyps or uterine  cancer  She has some foot problems.  She will see a foot doctor regarding that in the meantime she will exercise using a stationary bike and in the pool  She will see me again in 1 year.  She knows to call for any problems that may develop before that visit.  Magrinat, GusVirgie DadD  11/04/17 12:44 PM Medical Oncology and Hematology [facility] [id number]. [phone]  Fax. [phone]  I, Sheron Nightingale, am acting as scribe for Chauncey Cruel MD.  I, Lurline Del MD, have reviewed the above documentation for accuracy and completeness, and I agree with the above.

## 2017-11-04 ENCOUNTER — Telehealth: Payer: Self-pay | Admitting: Oncology

## 2017-11-04 ENCOUNTER — Inpatient Hospital Stay: Payer: BLUE CROSS/BLUE SHIELD | Attending: Oncology

## 2017-11-04 ENCOUNTER — Inpatient Hospital Stay (HOSPITAL_BASED_OUTPATIENT_CLINIC_OR_DEPARTMENT_OTHER): Payer: BLUE CROSS/BLUE SHIELD | Admitting: Oncology

## 2017-11-04 VITALS — BP 145/93 | HR 83 | Temp 98.4°F | Resp 18 | Ht 67.0 in | Wt 188.3 lb

## 2017-11-04 DIAGNOSIS — Z79810 Long term (current) use of selective estrogen receptor modulators (SERMs): Secondary | ICD-10-CM | POA: Diagnosis not present

## 2017-11-04 DIAGNOSIS — Z923 Personal history of irradiation: Secondary | ICD-10-CM | POA: Insufficient documentation

## 2017-11-04 DIAGNOSIS — Z170 Estrogen receptor positive status [ER+]: Secondary | ICD-10-CM | POA: Diagnosis not present

## 2017-11-04 DIAGNOSIS — C50411 Malignant neoplasm of upper-outer quadrant of right female breast: Secondary | ICD-10-CM | POA: Insufficient documentation

## 2017-11-04 DIAGNOSIS — Z975 Presence of (intrauterine) contraceptive device: Secondary | ICD-10-CM | POA: Diagnosis not present

## 2017-11-04 DIAGNOSIS — Z9221 Personal history of antineoplastic chemotherapy: Secondary | ICD-10-CM | POA: Diagnosis not present

## 2017-11-04 DIAGNOSIS — C773 Secondary and unspecified malignant neoplasm of axilla and upper limb lymph nodes: Secondary | ICD-10-CM | POA: Insufficient documentation

## 2017-11-04 LAB — CBC WITH DIFFERENTIAL (CANCER CENTER ONLY)
Basophils Absolute: 0.1 10*3/uL (ref 0.0–0.1)
Basophils Relative: 1 %
EOS ABS: 0.5 10*3/uL (ref 0.0–0.5)
EOS PCT: 6 %
HCT: 37.2 % (ref 34.8–46.6)
Hemoglobin: 12.5 g/dL (ref 11.6–15.9)
LYMPHS ABS: 1.8 10*3/uL (ref 0.9–3.3)
LYMPHS PCT: 22 %
MCH: 31.6 pg (ref 25.1–34.0)
MCHC: 33.6 g/dL (ref 31.5–36.0)
MCV: 94 fL (ref 79.5–101.0)
MONOS PCT: 9 %
Monocytes Absolute: 0.7 10*3/uL (ref 0.1–0.9)
Neutro Abs: 5.2 10*3/uL (ref 1.5–6.5)
Neutrophils Relative %: 62 %
PLATELETS: 229 10*3/uL (ref 145–400)
RBC: 3.96 MIL/uL (ref 3.70–5.45)
RDW: 14.4 % (ref 11.2–14.5)
WBC Count: 8.2 10*3/uL (ref 3.9–10.3)

## 2017-11-04 LAB — CMP (CANCER CENTER ONLY)
ALK PHOS: 74 U/L (ref 38–126)
ALT: 37 U/L (ref 0–44)
AST: 35 U/L (ref 15–41)
Albumin: 3.5 g/dL (ref 3.5–5.0)
Anion gap: 8 (ref 5–15)
BUN: 11 mg/dL (ref 6–20)
CHLORIDE: 104 mmol/L (ref 98–111)
CO2: 26 mmol/L (ref 22–32)
CREATININE: 0.72 mg/dL (ref 0.44–1.00)
Calcium: 8.8 mg/dL — ABNORMAL LOW (ref 8.9–10.3)
GFR, Estimated: >60 mL/min (ref >60)
GLUCOSE: 92 mg/dL (ref 70–99)
Potassium: 4.1 mmol/L (ref 3.5–5.1)
Sodium: 138 mmol/L (ref 135–145)
Total Bilirubin: 0.3 mg/dL (ref 0.3–1.2)
Total Protein: 6.6 g/dL (ref 6.5–8.1)

## 2017-11-04 MED ORDER — TAMOXIFEN CITRATE 20 MG PO TABS: 20.0000 mg | ORAL_TABLET | Freq: Every day | ORAL | 4 refills | Status: DC

## 2017-11-04 NOTE — Telephone Encounter (Signed)
Scheduled appt per 6/25 los - pt did not want avs or calendar with appts.

## 2018-02-04 ENCOUNTER — Other Ambulatory Visit: Payer: Self-pay | Admitting: Oncology

## 2018-02-04 DIAGNOSIS — C50411 Malignant neoplasm of upper-outer quadrant of right female breast: Secondary | ICD-10-CM

## 2018-02-04 DIAGNOSIS — Z170 Estrogen receptor positive status [ER+]: Secondary | ICD-10-CM

## 2018-11-04 ENCOUNTER — Telehealth: Payer: Self-pay

## 2018-11-04 NOTE — Telephone Encounter (Signed)
Called and left voicemail regarding pre-screening questions for appt on 6/25  

## 2018-11-05 ENCOUNTER — Encounter: Payer: Self-pay | Admitting: Oncology

## 2018-11-05 ENCOUNTER — Inpatient Hospital Stay: Payer: 59 | Attending: Oncology | Admitting: Oncology

## 2018-11-05 ENCOUNTER — Inpatient Hospital Stay: Payer: 59

## 2018-11-05 DIAGNOSIS — Z170 Estrogen receptor positive status [ER+]: Secondary | ICD-10-CM

## 2018-11-05 DIAGNOSIS — C50411 Malignant neoplasm of upper-outer quadrant of right female breast: Secondary | ICD-10-CM

## 2018-11-05 NOTE — Progress Notes (Signed)
No show

## 2023-03-10 ENCOUNTER — Other Ambulatory Visit: Payer: Self-pay | Admitting: Family Medicine

## 2023-03-10 ENCOUNTER — Other Ambulatory Visit (HOSPITAL_COMMUNITY)
Admission: RE | Admit: 2023-03-10 | Discharge: 2023-03-10 | Disposition: A | Discharge status: Home / Self Care | Payer: Medicaid Other | Source: Ambulatory Visit | Attending: Family Medicine | Admitting: Family Medicine

## 2023-03-10 DIAGNOSIS — Z01411 Encounter for gynecological examination (general) (routine) with abnormal findings: Secondary | ICD-10-CM | POA: Diagnosis present

## 2023-03-13 LAB — CYTOLOGY - PAP
Comment: NEGATIVE
Diagnosis: NEGATIVE
High risk HPV: NEGATIVE

## 2023-04-15 ENCOUNTER — Other Ambulatory Visit: Payer: Self-pay | Admitting: Family Medicine

## 2023-04-15 DIAGNOSIS — Z1231 Encounter for screening mammogram for malignant neoplasm of breast: Secondary | ICD-10-CM

## 2023-05-05 ENCOUNTER — Other Ambulatory Visit (HOSPITAL_COMMUNITY): Payer: Self-pay | Admitting: Family Medicine

## 2023-05-05 DIAGNOSIS — E782 Mixed hyperlipidemia: Secondary | ICD-10-CM

## 2023-05-15 ENCOUNTER — Ambulatory Visit
Admission: RE | Admit: 2023-05-15 | Discharge: 2023-05-15 | Disposition: A | Discharge status: Home / Self Care | Payer: Medicaid Other | Source: Ambulatory Visit | Attending: Family Medicine | Admitting: Family Medicine

## 2023-05-15 DIAGNOSIS — Z1231 Encounter for screening mammogram for malignant neoplasm of breast: Secondary | ICD-10-CM

## 2023-06-06 ENCOUNTER — Ambulatory Visit (HOSPITAL_COMMUNITY)
Admission: RE | Admit: 2023-06-06 | Discharge: 2023-06-06 | Disposition: A | Discharge status: Home / Self Care | Payer: Self-pay | Source: Ambulatory Visit | Attending: Family Medicine | Admitting: Family Medicine

## 2023-06-06 DIAGNOSIS — E782 Mixed hyperlipidemia: Secondary | ICD-10-CM | POA: Insufficient documentation

## 2023-06-06 DIAGNOSIS — M79641 Pain in right hand: Secondary | ICD-10-CM | POA: Insufficient documentation

## 2023-06-13 DIAGNOSIS — M25571 Pain in right ankle and joints of right foot: Secondary | ICD-10-CM | POA: Insufficient documentation

## 2023-08-25 ENCOUNTER — Ambulatory Visit (HOSPITAL_BASED_OUTPATIENT_CLINIC_OR_DEPARTMENT_OTHER): Admitting: Family

## 2023-08-25 ENCOUNTER — Encounter (HOSPITAL_BASED_OUTPATIENT_CLINIC_OR_DEPARTMENT_OTHER): Payer: Self-pay | Admitting: Family

## 2023-08-25 VITALS — BP 132/70 | HR 83 | Ht 67.0 in | Wt 173.4 lb

## 2023-08-25 DIAGNOSIS — E7849 Other hyperlipidemia: Secondary | ICD-10-CM

## 2023-08-25 DIAGNOSIS — Z853 Personal history of malignant neoplasm of breast: Secondary | ICD-10-CM

## 2023-08-25 DIAGNOSIS — I25118 Atherosclerotic heart disease of native coronary artery with other forms of angina pectoris: Secondary | ICD-10-CM

## 2023-08-25 DIAGNOSIS — R072 Precordial pain: Secondary | ICD-10-CM

## 2023-08-25 MED ORDER — METOPROLOL TARTRATE 100 MG PO TABS: ORAL_TABLET | ORAL | 0 refills | Status: DC

## 2023-08-25 MED ORDER — ROSUVASTATIN CALCIUM 20 MG PO TABS: 20.0000 mg | ORAL_TABLET | Freq: Every day | ORAL | 3 refills | Status: DC

## 2023-08-25 NOTE — Progress Notes (Signed)
 Cardiology Office Note:  .   Date:  08/25/2023  ID:  Kelly Mills, DOB 09/30/1970, MRN [id number] PCP: Bufford Carne, FNP  [facility] Providers Cardiologist:  None {  History of Present Illness: .   Kelly Mills is a 53 y.o. 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   Today she is seen 08/25/23 as a new consult for evaluation of coronary calcium score. 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 No prior tobacco use. Shares with me that she was surprised by her diagnosis of hyperlipidemia in October as she follows a dietary plan to reduce inflammation avoiding dairy, focusing on fish, low sodium. Did not tolerate Atorvastatin daily dosing but tolerates at bedtime 3x per week. Due to her elevated lipid panel, pursued coronary calcium score 06/07/23 of 203 (all in LAD), placing her in 98th percentile. She does have some arthritis that limits physical activity but is doing yoga twice per week at home and plans to start walking.   Reports no shortness of breath nor dyspnea on exertion. Reports no chest pain, pressure, or tightness. No edema, orthopnea, PND. Reports no palpitations.    Labs via [facility]: 04/30/23 ALT 12, AST 11 Total cholesterol 189, HDL 41, LDL 128, triglycerides 110 (02/2023 LDL 204) Hb 14.1, Hct 42.2 GFR 112  Cardiovascular risk factors: Prior clinical ASCVD: None Comorbid conditions: Hyperlipidemia Metabolic syndrome/Obesity: N/A BMI 27 Chronic inflammatory conditions: Arthritis (no rheumatoid arthritis) Tobacco use history: None Family history: Both parents HLD, Dad with valvular heart disease Exercise  level: Yoga a couple times per week Current diet: Low sodium, no dairy  ROS: Please see the history of present illness.    All other systems reviewed and are negative.   Studies Reviewed: Aaron Aas    EKG:  EKG Interpretation Date/Time:  Monday August 25 2023 12:08:45 EDT Ventricular Rate:  80 PR Interval:  138 QRS Duration:  86 QT Interval:  372 QTC Calculation: 429 R Axis:   74  Text Interpretation: Normal sinus rhythm with sinus arrhythmia Normal ECG Confirmed by Neomi Banks ([id number]) on 08/25/2023 12:10:26 PM    Cardiac Studies & Procedures   ______________________________________________________________________________________________          CT SCANS  CT CARDIAC SCORING (SELF PAY ONLY) 06/06/2023  Addendum 06/07/2023 10:48 PM ADDENDUM REPORT: 06/07/2023 22:46  EXAM: OVER-READ INTERPRETATION  CT CHEST  The following report is an over-read performed by radiologist Dr. Chadwick Colonel of [facility] on 06/07/2023. This over-read does not include interpretation of cardiac or coronary anatomy or pathology. The coronary calcium score interpretation by the cardiologist is attached.  COMPARISON:  None.  FINDINGS: Vascular: No aortic atherosclerosis. The included aorta is normal in caliber.  Mediastinum/nodes: No adenopathy or mass. Unremarkable esophagus.  Lungs: No focal airspace disease. No pulmonary nodule. No pleural fluid. The included airways are patent.  Upper abdomen: No acute or unexpected findings.  Musculoskeletal: There are no acute or suspicious osseous abnormalities. Right axillary surgical clips.  IMPRESSION: No acute or unexpected extracardiac findings.   Electronically Signed By: Chadwick Colonel M.D. On: 06/07/2023 22:46  Narrative CLINICAL DATA:  Cardiovascular Disease Risk stratification  EXAM: Coronary  Calcium Score  TECHNIQUE: A gated, non-contrast computed tomography scan of the heart was performed using 3mm slice  thickness. Axial images were analyzed on a dedicated workstation. Calcium scoring of the coronary arteries was performed using the Agatston method.  FINDINGS: Coronary Calcium Score:  Left main: 0  Left anterior descending artery: 203  Left circumflex artery: 0  Right coronary artery: 0  Total: 203  Percentile: 98th  Pericardium: Normal.  Ascending Aorta: 31.3 mm at the mid ascending aorta measured in an axial plane.  IMPRESSION: 1. Coronary calcium score of 203. This was 98th percentile for age-, race-, and sex-matched controls.  2. Non-cardiac: See separate report from [facility].  RECOMMENDATIONS: Coronary artery calcium (CAC) score is a strong predictor of incident coronary heart disease (CHD) and provides predictive information beyond traditional risk factors. CAC scoring is reasonable to use in the decision to withhold, postpone, or initiate statin therapy in intermediate-risk or selected borderline-risk asymptomatic adults (age 68-75 years and LDL-C >=70 to <190 mg/dL) who do not have diabetes or established atherosclerotic cardiovascular disease (ASCVD).* In intermediate-risk (10-year ASCVD risk >=7.5% to <20%) adults or selected borderline-risk (10-year ASCVD risk >=5% to <7.5%) adults in whom a CAC score is measured for the purpose of making a treatment decision the following recommendations have been made:  If CAC=0, it is reasonable to withhold statin therapy and reassess in 5 to 10 years, as long as higher risk conditions are absent (diabetes mellitus, family history of premature CHD in first degree relatives (males <55 years; females <65 years), cigarette smoking, or LDL >=190 mg/dL).  If CAC is 1 to 99, it is reasonable to initiate statin therapy for patients >=51 years of age.  If CAC is >=100 or >=75th percentile, it is reasonable to initiate statin therapy at any age.  Cardiology referral should be considered for patients with  CAC scores >=400 or >=75th percentile.  *2018 AHA/ACC/AACVPR/AAPA/ABC/ACPM/ADA/AGS/APhA/ASPC/NLA/PCNA Guideline on the Management of Blood Cholesterol: A Report of the American College of Cardiology/American Heart Association Task Force on Clinical Practice Guidelines. J Am Coll Cardiol. 2019;73(24):3168-3209.  Olinda Bertrand, DO, [facility]  Electronically Signed: By: Olinda Bertrand D.O. On: 06/07/2023 19:41     ______________________________________________________________________________________________       Physical Exam:   VS:  BP (!) 148/90   Pulse 83   Ht 5\' 7"  (1.702 m)   Wt 173 lb 6.4 oz (78.7 kg)   SpO2 97%   BMI 27.16 kg/m    Wt Readings from Last 3 Encounters:  08/25/23 173 lb 6.4 oz (78.7 kg)  11/04/17 188 lb 4.8 oz (85.4 kg)  10/21/16 179 lb 14.4 oz (81.6 kg)    Vitals:   08/25/23 1158 08/25/23 1242  BP: (!) 148/90 132/70  Pulse: 83   Height: 5\' 7"  (1.702 m)   Weight: 173 lb 6.4 oz (78.7 kg)   SpO2: 97%   BMI (Calculated): 27.15     GEN: Well nourished, well developed in no acute distress HEENT: Normal, moist mucous membranes NECK: No JVD CARDIAC: regular rhythm, normal S1 and S2, no rubs or gallops. No murmur. VASCULAR: Radial and DP pulses 2+ bilaterally. No carotid bruits RESPIRATORY:  Clear to auscultation without rales, wheezing or rhonchi  ABDOMEN: Soft, non-tender, non-distended MUSCULOSKELETAL:  Ambulates independently SKIN: Warm and dry, no edema NEUROLOGIC:  Alert and oriented x 3. No focal neuro deficits noted. PSYCHIATRIC:  Normal affect    ASSESSMENT AND PLAN: .    CAD - No anginal symptoms. coronary calcium  score 06/07/23 of 203 (all in LAD), placing her in 98th percentile. Requests further quantification of stenosis. Plan for cardiac CTA to rule out ischemia. Aware of use of nitroglycerin during study. BMET ordered to ensure stable renal function prior to CTA.  Refer to [facility] for consideration of Right Start exercise  program. Optimization of GDMT: Start Aspirin EC 81mg  daily Stop Atorvastatin. Start Rosuvastatin 20mg , as below.   Familial hyperlipidemia - 02/2023 LDL 204 consistent with familial hyperlipidemia. Both parents with HLD. After Atorvastatin 20mg  3x per week, 04/2023 LDL  128. Did not tolerate more frequent dosing.  Stop Atorvastatin as did not tolerate daily dosing. Start Rosuvastatin 20mg  3x per week x 2 weeks then increase to daily dosing.  Lp(a) with labs next week. FLP/LFT in 6 weeks to reassess LDL on Rosuvastatin  History of breast cancer - Completed treatment in 2013. Refer to exercise programming at [facility].   Elevated BP without diagnosis of hypertension - initial BP in clinic 148/90 with repeat without intervention 132/70. Continue to monitor at periodic visits. Reports BP at PCP office routinely 120s. Anticipate some stress with meeting new provider today.   CV risk counseling and prevention -recommend heart healthy/Mediterranean diet, with whole grains, fruits, vegetable, fish, lean meats, nuts, and olive oil. Limit salt. -recommend moderate walking, 3-5 times/week for 30-50 minutes each session. Aim for at least 150 minutes.week. Goal should be pace of 3 miles/hours, or walking 1.5 miles in 30 minutes -recommend avoidance of tobacco products. Avoid excess alcohol.   Dispo: follow up in 6 weeks with Dr. Veryl Gottron  Signed, Clearnce Curia, NP

## 2023-08-25 NOTE — Patient Instructions (Addendum)
 Medication Instructions:   START Aspirin EC 81mg  daily  STOP Atorvastatin  START Rosuvastatin 20mg  daily Start 20mg  three times per week in the evening After two weeks, increase to 20mg  once per day in the evening  *If you need a refill on your cardiac medications before your next appointment, please call your pharmacy*  Lab Work: Your physician recommends that you return for lab work 1 week before CTA for BMET, Lp(a)  Your physician recommends that you return for lab work in 6 weeks for fasting lipid panel, LFT   If you have labs (blood work) drawn today and your tests are completely normal, you will receive your results only by: MyChart Message (if you have MyChart) OR A paper copy in the mail If you have any lab test that is abnormal or we need to change your treatment, we will call you to review the results.  Testing/Procedures: Your EKG today showed normal sinus rhythm which is a good result!    Your cardiac CT will be scheduled at one of the below locations:   [facility] [address] [place], [place] [address] [phone]  OR   [facility]ul [facility]. [facility] [address]  [place], [place] [address] Opening September 08, 2023  If scheduled at [facility], please arrive at the [facility] Entrance (Entrance C2) of [facility] 30 minutes prior to test start time. You can use the FREE valet parking offered at entrance C (encouraged to control the heart rate for the test)  Proceed to the [facility] Radiology Department (first floor) to check-in and test prep.  All radiology patients and guests should use entrance C2 at [facility], accessed from [facility], even though the hospital's physical address listed is [address].     Please follow these instructions carefully (unless otherwise directed):  An IV will be required for this test and Nitroglycerin will be given.   On the  Night Before the Test: Be sure to Drink plenty of water. Do not consume any caffeinated/decaffeinated beverages or chocolate 12 hours prior to your test. Do not take any antihistamines 12 hours prior to your test.  On the Day of the Test: Drink plenty of water until 1 hour prior to the test. Do not eat any food 1 hour prior to test. You may take your regular medications prior to the test.  Take metoprolol (Lopressor) 100mg  two hours prior to test. If you take Furosemide/Hydrochlorothiazide/Spironolactone/Chlorthalidone, please HOLD on the morning of the test. Patients who wear a continuous glucose monitor MUST remove the device prior to scanning. FEMALES- please wear underwire-free bra if available, avoid dresses & tight clothing      After the Test: Drink plenty of water. After receiving IV contrast, you may experience a mild flushed feeling. This is normal. On occasion, you may experience a mild rash up to 24 hours after the test. This is not dangerous. If this occurs, you can take Benadryl 25 mg, Zyrtec, Claritin, or Allegra and increase your fluid intake. (Patients taking Tikosyn should avoid Benadryl, and may take Zyrtec, Claritin, or Allegra) If you experience trouble breathing, this can be serious. If it is severe call 911 IMMEDIATELY. If it is mild, please call our office.  We will call to schedule your test 2-4 weeks out understanding that some insurance companies will need an authorization prior to the service being performed.   For more information and frequently asked questions,  please visit our website : [email or web address]  For non-scheduling related questions, please contact the cardiac imaging nurse navigator should you have any questions/concerns: Cardiac Imaging Nurse Navigators Direct Office Dial: [phone]   For scheduling needs, including cancellations and rescheduling, please call [place], [phone].  Follow-Up: At [facility], you  and your health needs are our priority.  As part of our continuing mission to provide you with exceptional heart care, our providers are all part of one team.  This team includes your primary Cardiologist (physician) and Advanced Practice Providers or APPs (Physician Assistants and Nurse Practitioners) who all work together to provide you with the care you need, when you need it.  Your next appointment:   6 week(s)  Provider:   Sheryle Donning, MD   We recommend signing up for the patient portal called "MyChart".  Sign up information is provided on this After Visit Summary.  MyChart is used to connect with patients for Virtual Visits (Telemedicine).  Patients are able to view lab/test results, encounter notes, upcoming appointments, etc.  Non-urgent messages can be sent to your provider as well.   To learn more about what you can do with MyChart, go to [email or web address].   Other Instructions  For coronary artery disease often called "heart disease" we aim for optimal guideline directed medical therapy. We use the "A, B, C"s to help keep us  on track!  A = Aspirin 81mg  daily B = Blood pressure to goal <130/80 C = Cholesterol control. You take Rosuvastatin to help control your cholesterol. Our goal is your LDL (Bad cholesterol) to be less than 70 D = Diet - aiming for heart healthy diet E = Exercise - Recommend aiming for 150 minutes of moderate intensity activity per week

## 2023-09-01 ENCOUNTER — Encounter (HOSPITAL_BASED_OUTPATIENT_CLINIC_OR_DEPARTMENT_OTHER): Payer: Self-pay

## 2023-09-02 ENCOUNTER — Other Ambulatory Visit (HOSPITAL_BASED_OUTPATIENT_CLINIC_OR_DEPARTMENT_OTHER): Payer: Self-pay | Admitting: Family Medicine

## 2023-09-02 ENCOUNTER — Ambulatory Visit (HOSPITAL_BASED_OUTPATIENT_CLINIC_OR_DEPARTMENT_OTHER)
Admission: RE | Admit: 2023-09-02 | Discharge: 2023-09-02 | Disposition: A | Discharge status: Home / Self Care | Source: Ambulatory Visit | Attending: Family Medicine | Admitting: Family Medicine

## 2023-09-02 DIAGNOSIS — M79671 Pain in right foot: Secondary | ICD-10-CM | POA: Insufficient documentation

## 2023-09-02 DIAGNOSIS — M79672 Pain in left foot: Secondary | ICD-10-CM | POA: Insufficient documentation

## 2023-09-09 ENCOUNTER — Encounter (HOSPITAL_COMMUNITY): Payer: Self-pay

## 2023-09-11 ENCOUNTER — Ambulatory Visit (HOSPITAL_COMMUNITY)
Admission: RE | Admit: 2023-09-11 | Discharge: 2023-09-11 | Disposition: A | Discharge status: Home / Self Care | Source: Ambulatory Visit | Attending: Family | Admitting: Family

## 2023-09-11 ENCOUNTER — Encounter (HOSPITAL_BASED_OUTPATIENT_CLINIC_OR_DEPARTMENT_OTHER): Payer: Self-pay

## 2023-09-11 DIAGNOSIS — R072 Precordial pain: Secondary | ICD-10-CM | POA: Insufficient documentation

## 2023-09-11 MED ORDER — NITROGLYCERIN 0.4 MG SL SUBL
SUBLINGUAL_TABLET | SUBLINGUAL | Status: AC
  Filled 2023-09-11: qty 2

## 2023-09-11 MED ORDER — IOHEXOL 350 MG/ML SOLN
100.0000 mL | Freq: Once | INTRAVENOUS | Status: AC | PRN
  Administered 2023-09-11: 100 mL via INTRAVENOUS

## 2023-09-11 MED ORDER — NITROGLYCERIN 0.4 MG SL SUBL
0.8000 mg | SUBLINGUAL_TABLET | Freq: Once | SUBLINGUAL | Status: AC
  Administered 2023-09-11: 0.8 mg via SUBLINGUAL

## 2023-09-25 DIAGNOSIS — M19079 Primary osteoarthritis, unspecified ankle and foot: Secondary | ICD-10-CM | POA: Insufficient documentation

## 2023-09-25 DIAGNOSIS — M19041 Primary osteoarthritis, right hand: Secondary | ICD-10-CM | POA: Insufficient documentation

## 2023-09-29 ENCOUNTER — Ambulatory Visit: Payer: Medicaid Other | Admitting: Neurology

## 2023-09-29 ENCOUNTER — Encounter: Payer: Self-pay | Admitting: Neurology

## 2023-09-29 VITALS — BP 133/86 | HR 86 | Ht 67.0 in | Wt 170.5 lb

## 2023-09-29 DIAGNOSIS — G629 Polyneuropathy, unspecified: Secondary | ICD-10-CM | POA: Insufficient documentation

## 2023-09-29 DIAGNOSIS — M792 Neuralgia and neuritis, unspecified: Secondary | ICD-10-CM | POA: Insufficient documentation

## 2023-09-29 MED ORDER — DULOXETINE HCL 30 MG PO CPEP: 30.0000 mg | ORAL_CAPSULE | Freq: Every day | ORAL | 6 refills | Status: AC

## 2023-09-29 MED ORDER — LIDOCAINE-PRILOCAINE 2.5-2.5 % EX CREA: TOPICAL_CREAM | CUTANEOUS | 11 refills | Status: AC

## 2023-09-29 NOTE — Progress Notes (Signed)
 Chief Complaint  Patient presents with   New Patient (Initial Visit)    Rm14, alone ,referral for Bilateral peripheral neuropathy/Dr. Frances Ingles [facility] [phone]: bilateral feet burn/tingly ongoing since 2013, some days are worse than others but more noticeable in past 2 years       ASSESSMENT AND PLAN  Kelly Mills is a 53 y.o. 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   MEDICAL HISTORY:  Kelly Mills is a 53 year old female, seen in request by [facility] Dr.  Jacqulyne Maxim, [place] for evaluation of bilateral feet paresthesia, her primary care is from [facility] nurse practitioner [place] NP Marleta Simmer, initial evaluation was on Sep 29, 2023  History is obtained from the patient and review of electronic medical records. I personally reviewed pertinent available imaging films in PACS.   PMHx of  Right breast cancer in 2013, lobectomy, chemo/radiation HLD Chronic insomnia    She had a history of right breast cancer in 2013, was treated with right lumpectomy followed by chemoradiation therapy  During the chemotherapy, she developed bilateral feet numbness tingling, has been persistent since its onset, over the years, she has persistent bilateral foot numbness, sometimes worse than the other, also have nerve pain, if she stepped on a small object on the floor, it will send shooting unbearable pain to through her feet, few years ago, she also was diagnosed with right foot hairline fracture, also have right foot pain  She also have arthritic  pain of her hands, denies significant low back pain, mild gait difficulty due to foot pain, but denies bowel and bladder incontinence, denies restless leg symptoms  PHYSICAL EXAM:   Vitals:   09/29/23 0829  BP: 133/86  Pulse: 86  Weight: 170 lb 8 oz (77.3 kg)  Height: 5\' 7"  (1.702 m)     Body mass index is 26.7 kg/m.  PHYSICAL EXAMNIATION:  Gen: NAD, conversant, well nourised, well groomed                     Cardiovascular: Regular rate rhythm, no peripheral edema, warm, nontender. Eyes: Conjunctivae clear without exudates or hemorrhage Neck: Supple, no carotid bruits. Pulmonary: Clear to auscultation bilaterally   NEUROLOGICAL EXAM:  MENTAL STATUS: Speech/cognition: Awake, alert, oriented to history taking and casual conversation CRANIAL NERVES: CN II: Visual fields are full to confrontation. Pupils are round equal and briskly reactive to light. CN III, IV, VI: extraocular movement are normal. No ptosis. CN V: Facial sensation is intact to light touch CN VII: Face is symmetric with normal eye closure  CN VIII: Hearing is normal to causal conversation. CN IX, X: Phonation is normal. CN XI: Head turning and shoulder shrug are intact  MOTOR: There is no pronator drift of out-stretched arms. Muscle bulk and tone are normal. Muscle strength is normal.  Significant tenderness at the top of right foot  REFLEXES: Reflexes are 2+ and symmetric at the biceps, triceps, knees, and ankles. Plantar responses are flexor.  SENSORY: Absent vibratory sensation at toes, length-dependent decreased light touch pinprick to ankle level  COORDINATION: There is  no trunk or limb dysmetria noted.  GAIT/STANCE: Posture is normal. Gait is steady, antalgic, bilateral flatfoot   REVIEW OF SYSTEMS:  Full 14 system review of systems performed and notable only for as above All other review of systems were negative.   ALLERGIES: Allergies  Allergen Reactions   Codeine Nausea Only    HOME  MEDICATIONS: Current Outpatient Medications  Medication Sig Dispense Refill   ascorbic acid (VITAMIN C) 500 MG tablet Take 500 mg by mouth daily.     Biotin 800 MCG TABS Take 1 tablet by mouth daily at 6 (six) AM.     Calcium -Vitamins C & D (CALCIUM /C/D PO) Take 1 tablet by mouth daily at 6 (six) AM.     ibuprofen (ADVIL,MOTRIN) 200 MG tablet Take 200 mg by mouth every 6 (six) hours as needed.     meloxicam (MOBIC) 15 MG tablet Take 15 mg by mouth daily.     metoprolol  tartrate (LOPRESSOR ) 100 MG tablet Take one tablet two hours prior to cardiac CTA 1 tablet 0   Multiple Vitamin (MULTIVITAMIN) capsule Take 1 capsule by mouth daily.     rosuvastatin  (CRESTOR ) 20 MG tablet Take 1 tablet (20 mg total) by mouth daily. 90 tablet 3   traZODone  (DESYREL ) 50 MG tablet Take 25 mg by mouth at bedtime. Pt takes 1/2 tablet once a day.     No current facility-administered medications for this visit.    PAST MEDICAL HISTORY: Past Medical History:  Diagnosis Date   Breast cancer (HCC)    right   History of chemotherapy    completed 11/13/11   History of radiation therapy 01/06/12-02/25/12   right breast,5040 cgy/28 sessions, boost 1440 cGy/8 sessions   Insomnia    only requires Trazodone  rarely   Personal history of chemotherapy    Personal history of radiation therapy    Prior alcohol use    08/25/23 nearly 1 year sobriety    PAST SURGICAL HISTORY: Past Surgical History:  Procedure Laterality Date   AXILLARY LYMPH NODE DISSECTION  12/04/2011   Procedure: AXILLARY LYMPH NODE DISSECTION;  Surgeon: Enid Harry, MD;  Location: [facility];  Service: General;  Laterality: Right;   BREAST LUMPECTOMY Right 12/04/11   right, ER/PR +, HER 2 -   NASAL SEPTUM SURGERY     PORT-A-CATH REMOVAL  12/04/2011   Procedure: REMOVAL PORT-A-CATH;  Surgeon: Enid Harry, MD;  Location: [facility];  Service: General;  Laterality: Left;   PORTACATH PLACEMENT  07/22/2011    Procedure: INSERTION PORT-A-CATH;  Surgeon: Enid Harry, MD;  Location: [facility] OR;  Service: General;  Laterality: N/A;  Insertion of Port-A-Cath    FAMILY HISTORY: Family History  Problem Relation Age of Onset   Hyperlipidemia Mother    Arthritis Mother    Hyperlipidemia Father    Hypertension Father    Valvular heart disease Father    Psoriasis Brother    Asthma Maternal Grandmother    Cancer Maternal Grandfather        melanoma   Diabetes Paternal Grandfather    Anesthesia problems Neg Hx     SOCIAL HISTORY: Social History   Socioeconomic History   Marital status: Married    Spouse name: Not on file   Number of children: Not on file   Years of education: Not on file   Highest education level: Not on file  Occupational History   Not on file  Tobacco Use   Smoking status: Former    Types:  Cigarettes   Smokeless tobacco: Never  Substance and Sexual Activity   Alcohol use: Not Currently    Comment: Sober in active recovery   Drug use: Not Currently    Frequency: 4.0 times per week    Types: Cocaine     Comment: Sober in active recovery   Sexual activity: Not Currently    Birth control/protection: I.U.D., None    Comment: menarche age 55, 1st pregnancy age 50  Other Topics Concern   Not on file  Social History Narrative   Not on file   Social Drivers of Health   Financial Resource Strain: Not on file  Food Insecurity: Not on file  Transportation Needs: Not on file  Physical Activity: Not on file  Stress: Not on file  Social Connections: Not on file  Intimate Partner Violence: Not on file      Phebe Brasil, M.D. Ph.D.  [facility] [address] [place], [place] [address] Ph: [phone] Fax: [phone]  CC:  Stafford Eagles, MD [address] [place],  [place] [address]  Bufford Carne, FNP

## 2023-10-01 ENCOUNTER — Ambulatory Visit: Payer: Self-pay | Admitting: Neurology

## 2023-10-01 LAB — COMPREHENSIVE METABOLIC PANEL WITH GFR
ALT: 14 IU/L (ref 0–32)
AST: 13 IU/L (ref 0–40)
Albumin: 4.3 g/dL (ref 3.8–4.9)
Alkaline Phosphatase: 62 IU/L (ref 44–121)
BUN/Creatinine Ratio: 17 (ref 9–23)
BUN: 10 mg/dL (ref 6–24)
Bilirubin Total: 0.3 mg/dL (ref 0.0–1.2)
CO2: 24 mmol/L (ref 20–29)
Calcium: 9.6 mg/dL (ref 8.7–10.2)
Chloride: 102 mmol/L (ref 96–106)
Creatinine, Ser: 0.58 mg/dL (ref 0.57–1.00)
Globulin, Total: 2.3 g/dL (ref 1.5–4.5)
Glucose: 115 mg/dL — ABNORMAL HIGH (ref 70–99)
Potassium: 4.1 mmol/L (ref 3.5–5.2)
Sodium: 138 mmol/L (ref 134–144)
Total Protein: 6.6 g/dL (ref 6.0–8.5)
eGFR: 108 mL/min/{1.73_m2} (ref >59)

## 2023-10-01 LAB — HGB A1C W/O EAG: Hgb A1c MFr Bld: 5.6 % (ref 4.8–5.6)

## 2023-10-01 LAB — CBC WITH DIFFERENTIAL/PLATELET
Basophils Absolute: 0.1 10*3/uL (ref 0.0–0.2)
Basos: 1 %
EOS (ABSOLUTE): 0.5 10*3/uL — ABNORMAL HIGH (ref 0.0–0.4)
Eos: 6 %
Hematocrit: 41.2 % (ref 34.0–46.6)
Hemoglobin: 13.9 g/dL (ref 11.1–15.9)
Immature Grans (Abs): 0 10*3/uL (ref 0.0–0.1)
Immature Granulocytes: 0 %
Lymphocytes Absolute: 2.3 10*3/uL (ref 0.7–3.1)
Lymphs: 32 %
MCH: 31.6 pg (ref 26.6–33.0)
MCHC: 33.7 g/dL (ref 31.5–35.7)
MCV: 94 fL (ref 79–97)
Monocytes Absolute: 0.5 10*3/uL (ref 0.1–0.9)
Monocytes: 7 %
Neutrophils Absolute: 4 10*3/uL (ref 1.4–7.0)
Neutrophils: 54 %
Platelets: 253 10*3/uL (ref 150–450)
RBC: 4.4 x10E6/uL (ref 3.77–5.28)
RDW: 12.1 % (ref 11.7–15.4)
WBC: 7.3 10*3/uL (ref 3.4–10.8)

## 2023-10-01 LAB — MULTIPLE MYELOMA PANEL, SERUM
Albumin SerPl Elph-Mcnc: 3.8 g/dL (ref 2.9–4.4)
Albumin/Glob SerPl: 1.4 (ref 0.7–1.7)
Alpha 1: 0.2 g/dL (ref 0.0–0.4)
Alpha2 Glob SerPl Elph-Mcnc: 0.7 g/dL (ref 0.4–1.0)
B-Globulin SerPl Elph-Mcnc: 1 g/dL (ref 0.7–1.3)
Gamma Glob SerPl Elph-Mcnc: 1 g/dL (ref 0.4–1.8)
Globulin, Total: 2.8 g/dL (ref 2.2–3.9)
IgA/Immunoglobulin A, Serum: 262 mg/dL (ref 87–352)
IgG (Immunoglobin G), Serum: 1039 mg/dL (ref 586–1602)
IgM (Immunoglobulin M), Srm: 91 mg/dL (ref 26–217)

## 2023-10-01 LAB — RPR: RPR Ser Ql: NONREACTIVE

## 2023-10-01 LAB — SEDIMENTATION RATE: Sed Rate: 4 mm/h (ref 0–40)

## 2023-10-01 LAB — FOLATE: Folate: 17.8 ng/mL (ref >3.0)

## 2023-10-01 LAB — CK: Total CK: 59 U/L (ref 32–182)

## 2023-10-01 LAB — C-REACTIVE PROTEIN: CRP: 2 mg/L (ref 0–10)

## 2023-10-01 LAB — VITAMIN B12: Vitamin B-12: 509 pg/mL (ref 232–1245)

## 2023-10-01 LAB — ANA W/REFLEX IF POSITIVE: Anti Nuclear Antibody (ANA): NEGATIVE

## 2023-10-01 LAB — TSH: TSH: 2.35 u[IU]/mL (ref 0.450–4.500)

## 2023-10-06 NOTE — Progress Notes (Unsigned)
 Cardiology Office Note    Date:  10/09/2023  ID:  Kelly Mills, DOB 1971-05-13, MRN [id number] PCP:  Bufford Carne, FNP  Cardiologist:  None  Electrophysiologist:  None   Chief Complaint: Follow up for CAD   History of Present Illness: .    Kelly Mills is a 53 y.o. female with visit-pertinent history of nonobstructive coronary artery disease noted on coronary CTA, familial hyperlipidemia, breast cancer s/p chemotherapy and radiation completed in 2013, alcohol/cocaine  use and recovery.  Patient with family history of hyperlipidemia in both parents and father with valvular heart disease, patient reports that he was born with defect of valve but does not close properly and had been monitored for 20 years.  Patient was seen in clinic on/14/25 by Neomi Banks, NP as a new consult for evaluation of coronary calcium  scoring.  Patient had previously been started on atorvastatin however was unable to tolerate daily dosing, she was tolerating bedtime dosing 3 times per week.  Due to her elevated lipid panel a coronary calcium  score was pursued on 06/07/2023 indicating a score of 203, all in the LAD, placing her in 98th percentile.  At visit patient reported no shortness of breath, nor dyspnea on exertion.  She denied any chest pain, pressure or tightness.  It was recommended that she have a cardiac CTA to rule out ischemia and she was referred to [facility] well for consideration of right start exercise program.  Patient was started on rosuvastatin  20 mg daily. Coronary CTA on 09/11/2023 indicated a coronary calcium  score of 162, this was 98th percentile for age, sex and race matched control.  T PV of 207 mm which was 81 percentile for age and sex matched control, TPD was moderate.  There was mild calcified plaque in the proximal LAD with 30 to 49% stenosis, mild stenosis in the RCA 30 to 49%, there was mild mall mid obtuse marginal vessel stenosis of 0 to 24%.  Today she presents for follow up. She  reports that she has been doing well.  She denies any chest pain, shortness of breath, lower extremity edema, orthopnea or PND.  She reports that she has seen neurology and was recently started on gabapentin.  She reports that she has been taking rosuvastatin  20 mg 3 times a week, has not yet increased to daily.  Patient reports that she is not exercising regularly, [place] and tolerates well.  She has no cardiac concerns or complaints today.  Labwork independently reviewed: 09/29/2023: Sodium 138, potassium 4.1, creatinine 0.58, AST 13, ALT 14, hemoglobin 13.9, hematocrit 41.2, hemoglobin A1c 5.6, TSH 2.350 ROS: .   Today she denies chest pain, shortness of breath, lower extremity edema, fatigue, palpitations, melena, hematuria, hemoptysis, diaphoresis, weakness, presyncope, syncope, orthopnea, and PND.  All other systems are reviewed and otherwise negative. Studies Reviewed: Aaron Aas   EKG:  EKG is not ordered today.  CV Studies: Cardiac studies reviewed are outlined and summarized above. Otherwise please see EMR for full report. Cardiac Studies & Procedures   ______________________________________________________________________________________________          CT SCANS  CT CORONARY MORPH W/CTA COR W/SCORE 09/11/2023  Addendum 09/13/2023 10:30 AM ADDENDUM REPORT: 09/13/2023 10:28  EXAM: OVER-READ INTERPRETATION  CT CHEST  The following report is an over-read performed by radiologist Dr. Chadwick Colonel of [facility] on 09/13/2023. This over-read does not include interpretation of cardiac or coronary anatomy or pathology. The coronary CTA interpretation by  the cardiologist is attached.  COMPARISON:  Cardiac CT 06/06/2023  FINDINGS: Vascular: No aortic atherosclerosis. The included aorta is normal in caliber.  Mediastinum/nodes: No adenopathy or mass. Unremarkable esophagus.  Lungs: Suspected post radiation change in the anterior  right lung. No focal airspace disease. No pulmonary nodule. No pleural fluid. The included airways are patent.  Upper abdomen: No acute or unexpected findings.  Musculoskeletal: There are no acute or suspicious osseous abnormalities.  IMPRESSION: No acute or unexpected extracardiac findings.   Electronically Signed By: Chadwick Colonel M.D. On: 09/13/2023 10:28  Narrative CLINICAL DATA:  53 year old with known coronary artery disease LAD  EXAM: Cardiac/Coronary CTA  TECHNIQUE: A non-contrast, gated CT scan was obtained with axial slices of 2.5 mm through the heart for calcium  scoring. Calcium  scoring was performed using the Agatston method. A 120 kV prospective, gated, contrast cardiac CT scan was obtained. Gantry rotation speed was 230 msec and collimation was 0.63 mm. Two sublingual nitroglycerin  tablets (0.8 mg) were given. The 3D data set was reconstructed with motion correction for the best systolic or diastolic phase. Images were analyzed on a dedicated workstation using MPR, MIP, and VRT modes. The patient received 95 cc of contrast.  FINDINGS: Image quality: Excellent.  Noise artifact is: Limited.  Coronary Arteries:  Normal coronary origin.  Right dominance.  Left main: The left main is a large caliber vessel with a normal take off from the left coronary cusp that bifurcates to form a left anterior descending artery and a left circumflex artery. There is no plaque or stenosis.  Left anterior descending artery: The LAD is patent without evidence of plaque or stenosis. The LAD gives off 2 patent diagonal branches.  Ramus intermedius: Patent with no evidence of plaque or stenosis.  Left circumflex artery: The LCX is non-dominant and patent with no evidence of plaque or stenosis. The LCX gives off 2 patent obtuse marginal branches. There is minimal mid OM vessel stenosis 0-24%.  Right coronary artery: The RCA is dominant with normal take off from the right  coronary cusp. There is mild mid calcified plaque 30-49% stenosis. The RCA terminates as a PDA and right posterolateral branch without evidence of plaque or stenosis.  Right Atrium: Right atrial size is within normal limits.  Right Ventricle: The right ventricular cavity is within normal limits.  Left Atrium: Left atrial size is normal in size with no left atrial appendage filling defect.  Left Ventricle: The ventricular cavity size is within normal limits.  Pulmonary arteries: Normal in size.  Pulmonary veins: Normal pulmonary venous drainage.  Pericardium: Normal thickness without significant effusion or calcium  present.  Cardiac valves: The aortic valve is trileaflet without significant calcification. The mitral valve is normal without significant calcification.  Aorta: Normal caliber, 27 mm,  without significant disease.  Extra-cardiac findings: See attached radiology report for non-cardiac structures.  IMPRESSION: 1. Coronary calcium  score of 162. This was 44 percentile for age-, sex, and race-matched controls.  2. Total plaque volume 207 mm3 which is 81 percentile for age- and sex-matched controls (calcified plaque 24mm3; non-calcified plaque 172mm3). TPV is (moderate).  3. Normal coronary origin with right dominance.  4. There is mild calcified plaque in the proximal LAD 30-49% stenosis. There is mild stenosis in the mid RCA 30-49%. There is minimal mid obtuse marginal vessel stenosis 0-24%. Overall non flow-limiting coronary artery disease. Continue with aggressive risk factor modification.  Electronically Signed: By: Dorothye Gathers M.D. On: 09/11/2023 15:02   CT SCANS  CT  CARDIAC SCORING (SELF PAY ONLY) 06/06/2023  Addendum 06/07/2023 10:48 PM ADDENDUM REPORT: 06/07/2023 22:46  EXAM: OVER-READ INTERPRETATION  CT CHEST  The following report is an over-read performed by radiologist Dr. Chadwick Colonel of [facility] on 06/07/2023.  This over-read does not include interpretation of cardiac or coronary anatomy or pathology. The coronary calcium  score interpretation by the cardiologist is attached.  COMPARISON:  None.  FINDINGS: Vascular: No aortic atherosclerosis. The included aorta is normal in caliber.  Mediastinum/nodes: No adenopathy or mass. Unremarkable esophagus.  Lungs: No focal airspace disease. No pulmonary nodule. No pleural fluid. The included airways are patent.  Upper abdomen: No acute or unexpected findings.  Musculoskeletal: There are no acute or suspicious osseous abnormalities. Right axillary surgical clips.  IMPRESSION: No acute or unexpected extracardiac findings.   Electronically Signed By: Chadwick Colonel M.D. On: 06/07/2023 22:46  Narrative CLINICAL DATA:  Cardiovascular Disease Risk stratification  EXAM: Coronary Calcium  Score  TECHNIQUE: A gated, non-contrast computed tomography scan of the heart was performed using 3mm slice thickness. Axial images were analyzed on a dedicated workstation. Calcium  scoring of the coronary arteries was performed using the Agatston method.  FINDINGS: Coronary Calcium  Score:  Left main: 0  Left anterior descending artery: 203  Left circumflex artery: 0  Right coronary artery: 0  Total: 203  Percentile: 98th  Pericardium: Normal.  Ascending Aorta: 31.3 mm at the mid ascending aorta measured in an axial plane.  IMPRESSION: 1. Coronary calcium  score of 203. This was 98th percentile for age-, race-, and sex-matched controls.  2. Non-cardiac: See separate report from [facility].  RECOMMENDATIONS: Coronary artery calcium  (CAC) score is a strong predictor of incident coronary heart disease (CHD) and provides predictive information beyond traditional risk factors. CAC scoring is reasonable to use in the decision to withhold, postpone, or initiate statin therapy in intermediate-risk or selected  borderline-risk asymptomatic adults (age 31-75 years and LDL-C >=70 to <190 mg/dL) who do not have diabetes or established atherosclerotic cardiovascular disease (ASCVD).* In intermediate-risk (10-year ASCVD risk >=7.5% to <20%) adults or selected borderline-risk (10-year ASCVD risk >=5% to <7.5%) adults in whom a CAC score is measured for the purpose of making a treatment decision the following recommendations have been made:  If CAC=0, it is reasonable to withhold statin therapy and reassess in 5 to 10 years, as long as higher risk conditions are absent (diabetes mellitus, family history of premature CHD in first degree relatives (males <55 years; females <65 years), cigarette smoking, or LDL >=190 mg/dL).  If CAC is 1 to 99, it is reasonable to initiate statin therapy for patients >=20 years of age.  If CAC is >=100 or >=75th percentile, it is reasonable to initiate statin therapy at any age.  Cardiology referral should be considered for patients with CAC scores >=400 or >=75th percentile.  *2018 AHA/ACC/AACVPR/AAPA/ABC/ACPM/ADA/AGS/APhA/ASPC/NLA/PCNA Guideline on the Management of Blood Cholesterol: A Report of the American College of Cardiology/American Heart Association Task Force on Clinical Practice Guidelines. J Am Coll Cardiol. 2019;73(24):3168-3209.  Olinda Bertrand, DO, [facility]  Electronically Signed: By: Olinda Bertrand D.O. On: 06/07/2023 19:41     ______________________________________________________________________________________________       Current Reported Medications:.    Current Meds  Medication Sig   ascorbic acid (VITAMIN C) 500 MG tablet Take 500 mg by mouth daily.   aspirin EC 81 MG tablet Take 81 mg by mouth daily. Swallow whole.   Biotin 800 MCG TABS Take 1 tablet by mouth daily at 6 (six)  AM.   Calcium -Vitamins C & D (CALCIUM /C/D PO) Take 1 tablet by mouth daily at 6 (six) AM.   DULoxetine  (CYMBALTA ) 30 MG capsule Take 1 capsule (30 mg total) by  mouth daily.   ibuprofen (ADVIL,MOTRIN) 200 MG tablet Take 200 mg by mouth every 6 (six) hours as needed.   lidocaine -prilocaine  (EMLA ) cream 1 gram qid prn   Multiple Vitamin (MULTIVITAMIN) capsule Take 1 capsule by mouth daily.   rosuvastatin  (CRESTOR ) 20 MG tablet Take 1 tablet (20 mg total) by mouth daily.   traZODone  (DESYREL ) 50 MG tablet Take 25 mg by mouth at bedtime. Pt takes 1/2 tablet once a day.    Physical Exam:    VS:  BP 132/78   Pulse 78   Ht 5\' 7"  (1.702 m)   Wt 169 lb (76.7 kg)   SpO2 97%   BMI 26.47 kg/m    Wt Readings from Last 3 Encounters:  10/09/23 169 lb (76.7 kg)  09/29/23 170 lb 8 oz (77.3 kg)  08/25/23 173 lb 6.4 oz (78.7 kg)    GEN: Well nourished, well developed in no acute distress NECK: No JVD; No carotid bruits CARDIAC: RRR, no murmurs, rubs, gallops RESPIRATORY:  Clear to auscultation without rales, wheezing or rhonchi  ABDOMEN: Soft, non-tender, non-distended EXTREMITIES:  No edema; No acute deformity     Asessement and Plan:.    CAD: Coronary calcium  score was pursued on 06/07/2023 indicating a score of 203, all in the LAD, placing her in 98th percentile.  Coronary CTA on 09/11/2023 indicated a coronary calcium  score of 162, this was 98th percentile for age, sex and race matched control.  T PV of 207 mm which was 81 percentile for age and sex matched control, TPD was moderate.  There was mild calcified plaque in the proximal LAD with 30 to 49% stenosis, mild stenosis in the RCA 30 to 49%, there was mild mall mid obtuse marginal vessel stenosis of 0 to 24%. Stable with no anginal symptoms. No indication for ischemic evaluation.  Heart healthy diet and regular cardiovascular exercise encouraged.  Continue aspirin 81 mg daily, rosuvastatin  20 mg 3 times weekly.   Familial hyperlipidemia: In 02/2023 LDL 204, consistent with familial hyperlipidemia.  Per patient reports both parents with hyperlipidemia.  Patient's LDL decreased 228 in 04/2023 after  initiation of atorvastatin 20 mg 3 times a week. At last visit patient's atorvastatin was stopped and she was started on rosuvastatin  20 mg 3 times a week for 2 weeks then to increase to daily dosing.  Today she reports that she has not increased to daily dosing, reports that she has been tolerating rosuvastatin  well.  Patient would like to have lab work checked next week, if LDL remains above goal recommend increasing rosuvastatin  to 20 mg daily.  History of breast cancer: Patient completed treatment in 2013.  Elevated BP without diagnosis of hypertension: Blood pressure today 142/88, on recheck was 132/78.  She reports that she is having some pain today related to arthritis.  Recommended patient obtain a blood pressure cuff and monitor her blood pressure at home, to notify the office if blood pressures consistently elevated above 130/80.    Disposition: F/u with Dr. Veryl Gottron in 3-4 months   Signed, Joshu Furukawa D Victoriana Aziz, NP

## 2023-10-09 ENCOUNTER — Ambulatory Visit: Attending: Cardiology | Admitting: Cardiology

## 2023-10-09 ENCOUNTER — Encounter: Payer: Self-pay | Admitting: Cardiology

## 2023-10-09 VITALS — BP 132/78 | HR 78 | Ht 67.0 in | Wt 169.0 lb

## 2023-10-09 DIAGNOSIS — Z853 Personal history of malignant neoplasm of breast: Secondary | ICD-10-CM | POA: Diagnosis present

## 2023-10-09 DIAGNOSIS — E7849 Other hyperlipidemia: Secondary | ICD-10-CM | POA: Insufficient documentation

## 2023-10-09 DIAGNOSIS — I25118 Atherosclerotic heart disease of native coronary artery with other forms of angina pectoris: Secondary | ICD-10-CM | POA: Diagnosis present

## 2023-10-09 NOTE — Patient Instructions (Signed)
 Medication Instructions:  No changes *If you need a refill on your cardiac medications before your next appointment, please call your pharmacy*  Lab Work: Next week we are going to need you to come back for fasting labs If you have labs (blood work) drawn today and your tests are completely normal, you will receive your results only by: MyChart Message (if you have MyChart) OR A paper copy in the mail If you have any lab test that is abnormal or we need to change your treatment, we will call you to review the results.  Testing/Procedures: No testing  Follow-Up: At [facility], you and your health needs are our priority.  As part of our continuing mission to provide you with exceptional heart care, our providers are all part of one team.  This team includes your primary Cardiologist (physician) and Advanced Practice Providers or APPs (Physician Assistants and Nurse Practitioners) who all work together to provide you with the care you need, when you need it.  Your next appointment:   3-4 month(s)  Provider:   Sheryle Donning MD  We recommend signing up for the patient portal called "MyChart".  Sign up information is provided on this After Visit Summary.  MyChart is used to connect with patients for Virtual Visits (Telemedicine).  Patients are able to view lab/test results, encounter notes, upcoming appointments, etc.  Non-urgent messages can be sent to your provider as well.   To learn more about what you can do with MyChart, go to [email or web address].

## 2023-10-20 ENCOUNTER — Ambulatory Visit (HOSPITAL_BASED_OUTPATIENT_CLINIC_OR_DEPARTMENT_OTHER): Payer: Self-pay | Admitting: Family

## 2023-10-20 DIAGNOSIS — E7849 Other hyperlipidemia: Secondary | ICD-10-CM

## 2023-10-20 LAB — HEPATIC FUNCTION PANEL
ALT: 13 IU/L (ref 0–32)
AST: 12 IU/L (ref 0–40)
Albumin: 4.4 g/dL (ref 3.8–4.9)
Alkaline Phosphatase: 59 IU/L (ref 44–121)
Bilirubin Total: 0.2 mg/dL (ref 0.0–1.2)
Bilirubin, Direct: 0.1 mg/dL (ref 0.00–0.40)
Total Protein: 6.7 g/dL (ref 6.0–8.5)

## 2023-10-20 LAB — LIPID PANEL
Chol/HDL Ratio: 4.4 ratio (ref 0.0–4.4)
Cholesterol, Total: 189 mg/dL (ref 100–199)
HDL: 43 mg/dL (ref >39)
LDL Chol Calc (NIH): 125 mg/dL — ABNORMAL HIGH (ref 0–99)
Triglycerides: 116 mg/dL (ref 0–149)
VLDL Cholesterol Cal: 21 mg/dL (ref 5–40)

## 2023-10-28 LAB — BASIC METABOLIC PANEL WITH GFR
BUN/Creatinine Ratio: 21 (ref 9–23)
BUN: 12 mg/dL (ref 6–24)
CO2: 21 mmol/L (ref 20–29)
Calcium: 9.5 mg/dL (ref 8.7–10.2)
Chloride: 100 mmol/L (ref 96–106)
Creatinine, Ser: 0.58 mg/dL (ref 0.57–1.00)
Glucose: 84 mg/dL (ref 70–99)
Potassium: 4.4 mmol/L (ref 3.5–5.2)
Sodium: 137 mmol/L (ref 134–144)
eGFR: 108 mL/min/{1.73_m2} (ref >59)

## 2023-10-28 LAB — LIPOPROTEIN A (LPA)

## 2023-10-31 NOTE — Addendum Note (Signed)
 Addended by: Guss Legacy on: 10/31/2023 04:11 PM   Modules accepted: Orders

## 2023-12-05 LAB — LIPOPROTEIN A (LPA): Lipoprotein (a): 205.1 nmol/L — ABNORMAL HIGH (ref <75.0)

## 2023-12-05 NOTE — Addendum Note (Signed)
 Addended by: Leeah Politano S on: 12/05/2023 08:46 AM   Modules accepted: Orders

## 2024-01-28 LAB — HEPATIC FUNCTION PANEL
ALT: 18 IU/L (ref 0–32)
AST: 18 IU/L (ref 0–40)
Albumin: 4.2 g/dL (ref 3.8–4.9)
Alkaline Phosphatase: 69 IU/L (ref 49–135)
Bilirubin Total: 0.4 mg/dL (ref 0.0–1.2)
Bilirubin, Direct: 0.14 mg/dL (ref 0.00–0.40)
Total Protein: 6.6 g/dL (ref 6.0–8.5)

## 2024-01-28 LAB — LIPID PANEL
Chol/HDL Ratio: 3.3 ratio (ref 0.0–4.4)
Cholesterol, Total: 157 mg/dL (ref 100–199)
HDL: 48 mg/dL (ref >39)
LDL Chol Calc (NIH): 90 mg/dL (ref 0–99)
Triglycerides: 104 mg/dL (ref 0–149)
VLDL Cholesterol Cal: 19 mg/dL (ref 5–40)

## 2024-01-28 NOTE — Progress Notes (Signed)
  Cardiology Office Note:  .   Date:  01/29/2024  ID:  Kelly Mills, DOB 04-Oct-1970, MRN [id number] PCP: Dyane Anthony RAMAN, FNP  [facility] Providers Cardiologist:  Shelda Bruckner, MD {  History of Present Illness: .   Kelly Mills is a 53 y.o. female with PMH nonobstructive CAD, prior history of alcohol and cocaine  abuse, now in sobriety, breast cancer s/p chemo and radiation in 2013. She established care with me on 01/29/24.  Pertinent CV history: Ca score 05/2023 of 203 (98th %ile). CT coronary 09/2023 with mild nonobstructive disease in mid RCA, pLAD and minimal disease OM. TPV 207. Ca score 162. Labs show lp(a) 205.  Today: Reviewed her labs, lipids show LDL 90 (goal <70), lp(a) was elevated at 205.   No symptoms. Tolerating rosuvastatin  well. We reviewed goals, options for clinical trials.  ROS: Denies chest pain, shortness of breath at rest or with normal exertion. No PND, orthopnea, LE edema or unexpected weight gain. No syncope or palpitations. ROS otherwise negative except as noted.   Studies Reviewed: SABRA    EKG:       Physical Exam:   VS:  BP 138/80 (BP Location: Left Arm, Patient Position: Sitting, Cuff Size: Normal)   Pulse 86   Resp 17   Ht 5' 7 (1.702 m)   Wt 177 lb (80.3 kg)   SpO2 96%   BMI 27.72 kg/m    Wt Readings from Last 3 Encounters:  01/29/24 177 lb (80.3 kg)  10/09/23 169 lb (76.7 kg)  09/29/23 170 lb 8 oz (77.3 kg)    GEN: Well nourished, well developed in no acute distress HEENT: Normal, moist mucous membranes NECK: No JVD CARDIAC: regular rhythm, normal S1 and S2, no rubs or gallops. No murmur. VASCULAR: Radial and DP pulses 2+ bilaterally. No carotid bruits RESPIRATORY:  Clear to auscultation without rales, wheezing or rhonchi  ABDOMEN: Soft, non-tender, non-distended MUSCULOSKELETAL:  Ambulates independently SKIN: Warm and dry, no edema NEUROLOGIC:  Alert and oriented x 3. No focal neuro deficits noted. PSYCHIATRIC:   Normal affect    ASSESSMENT AND PLAN: .    Nonobstructive CAD Elevated lp(a) Hypercholesterolemia -reviewed test results -LDL 90, goal <70 -lp(a) 205. Will see if she qualifies for a clinical trial -discussed options. Will increase to 40 mg rosuvastatin , recheck in 2-3 mos, if not at goal add zetia. If still not at goal, or not tolerated, pursue [id number] -continue aspirin -review red flag warning signs that need immediate medical attention  CV risk counseling and prevention -recommend heart healthy/Mediterranean diet, with whole grains, fruits, vegetable, fish, lean meats, nuts, and olive oil. Limit salt. -recommend moderate walking, 3-5 times/week for 30-50 minutes each session. Aim for at least 150 minutes/week. Goal should be pace of 3 miles/hours, or walking 1.5 miles in 30 minutes -recommend avoidance of tobacco products. Avoid excess alcohol.  Dispo: 1 year or sooner as needed  Signed, Shelda Bruckner, MD   Shelda Bruckner, MD, PhD, [facility]  [facility]  [place]  [facility] at [facility] at [facility] [address] [place], [place] [address] [phone]

## 2024-01-29 ENCOUNTER — Ambulatory Visit (INDEPENDENT_AMBULATORY_CARE_PROVIDER_SITE_OTHER): Admitting: Cardiology

## 2024-01-29 ENCOUNTER — Encounter (HOSPITAL_BASED_OUTPATIENT_CLINIC_OR_DEPARTMENT_OTHER): Payer: Self-pay | Admitting: Cardiology

## 2024-01-29 VITALS — BP 138/80 | HR 86 | Resp 17 | Ht 67.0 in | Wt 177.0 lb

## 2024-01-29 DIAGNOSIS — Z7189 Other specified counseling: Secondary | ICD-10-CM | POA: Diagnosis not present

## 2024-01-29 DIAGNOSIS — E7849 Other hyperlipidemia: Secondary | ICD-10-CM

## 2024-01-29 DIAGNOSIS — E7841 Elevated Lipoprotein(a): Secondary | ICD-10-CM

## 2024-01-29 DIAGNOSIS — I25118 Atherosclerotic heart disease of native coronary artery with other forms of angina pectoris: Secondary | ICD-10-CM

## 2024-01-29 DIAGNOSIS — E7800 Pure hypercholesterolemia, unspecified: Secondary | ICD-10-CM

## 2024-01-29 DIAGNOSIS — I2510 Atherosclerotic heart disease of native coronary artery without angina pectoris: Secondary | ICD-10-CM | POA: Diagnosis not present

## 2024-01-29 MED ORDER — ROSUVASTATIN CALCIUM 40 MG PO TABS: 40.0000 mg | ORAL_TABLET | Freq: Every day | ORAL | 3 refills | Status: DC

## 2024-01-29 MED ORDER — ROSUVASTATIN CALCIUM 40 MG PO TABS: 40.0000 mg | ORAL_TABLET | Freq: Every day | ORAL | 3 refills | Status: AC

## 2024-01-29 NOTE — Patient Instructions (Signed)
 Medication Instructions:  Your physician has recommended you make the following change in your medication:  1.) increase rosuvastatin  to 40 mg - one tablet daily  *If you need a refill on your cardiac medications before your next appointment, please call your pharmacy*  Lab Work: Return 2-3 months for blood work (lipid panel)   Testing/Procedures: none  Follow-Up: At [facility], you and your health needs are our priority.  As part of our continuing mission to provide you with exceptional heart care, our providers are all part of one team.  This team includes your primary Cardiologist (physician) and Advanced Practice Providers or APPs (Physician Assistants and Nurse Practitioners) who all work together to provide you with the care you need, when you need it.  Your next appointment:   12 month(s)  Provider:   Shelda Bruckner, MD, Rosaline Bane, NP, or Reche Finder, NP

## 2024-06-15 ENCOUNTER — Other Ambulatory Visit: Payer: Self-pay | Admitting: Family Medicine

## 2024-06-15 DIAGNOSIS — Z1231 Encounter for screening mammogram for malignant neoplasm of breast: Secondary | ICD-10-CM

## 2024-06-29 ENCOUNTER — Ambulatory Visit
